# Patient Record
Sex: Female | Born: 1990 | Race: White | Hispanic: No | Smoking: Former smoker
Health system: Southern US, Community
[De-identification: ages and names within clinical notes are randomized; demographics above are authoritative.]

## PROBLEM LIST (undated history)

## (undated) ENCOUNTER — Inpatient Hospital Stay (HOSPITAL_COMMUNITY): Payer: Self-pay

## (undated) DIAGNOSIS — F419 Anxiety disorder, unspecified: Secondary | ICD-10-CM

## (undated) DIAGNOSIS — T7840XA Allergy, unspecified, initial encounter: Secondary | ICD-10-CM

## (undated) DIAGNOSIS — F17200 Nicotine dependence, unspecified, uncomplicated: Secondary | ICD-10-CM

## (undated) DIAGNOSIS — F341 Dysthymic disorder: Secondary | ICD-10-CM

## (undated) DIAGNOSIS — Z202 Contact with and (suspected) exposure to infections with a predominantly sexual mode of transmission: Secondary | ICD-10-CM

## (undated) DIAGNOSIS — G4730 Sleep apnea, unspecified: Secondary | ICD-10-CM

## (undated) DIAGNOSIS — A4902 Methicillin resistant Staphylococcus aureus infection, unspecified site: Secondary | ICD-10-CM

## (undated) DIAGNOSIS — N946 Dysmenorrhea, unspecified: Secondary | ICD-10-CM

## (undated) HISTORY — DX: Dysthymic disorder: F34.1

## (undated) HISTORY — DX: Dysmenorrhea, unspecified: N94.6

## (undated) HISTORY — DX: Allergy, unspecified, initial encounter: T78.40XA

## (undated) HISTORY — DX: Nicotine dependence, unspecified, uncomplicated: F17.200

## (undated) HISTORY — DX: Methicillin resistant Staphylococcus aureus infection, unspecified site: A49.02

## (undated) HISTORY — DX: Contact with and (suspected) exposure to infections with a predominantly sexual mode of transmission: Z20.2

---
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
## 2008-01-17 ENCOUNTER — Ambulatory Visit: Payer: Self-pay | Admitting: Internal Medicine

## 2008-01-22 ENCOUNTER — Ambulatory Visit: Payer: Self-pay | Admitting: Family Medicine

## 2008-01-22 ENCOUNTER — Encounter: Payer: Self-pay | Admitting: Family Medicine

## 2008-01-22 ENCOUNTER — Other Ambulatory Visit: Admission: RE | Admit: 2008-01-22 | Discharge: 2008-01-22 | Payer: Self-pay | Admitting: Family Medicine

## 2008-01-29 ENCOUNTER — Encounter (INDEPENDENT_AMBULATORY_CARE_PROVIDER_SITE_OTHER): Payer: Self-pay | Admitting: *Deleted

## 2008-01-29 LAB — CONVERTED CEMR LAB: Pap Smear: NORMAL

## 2008-04-10 ENCOUNTER — Ambulatory Visit: Payer: Self-pay | Admitting: Family Medicine

## 2008-04-17 ENCOUNTER — Ambulatory Visit: Payer: Self-pay | Admitting: Family Medicine

## 2008-05-06 ENCOUNTER — Telehealth: Payer: Self-pay | Admitting: Family Medicine

## 2008-05-17 ENCOUNTER — Ambulatory Visit: Payer: Self-pay | Admitting: Family Medicine

## 2008-05-17 LAB — CONVERTED CEMR LAB
Nitrite: NEGATIVE
Protein, U semiquant: NEGATIVE
Urine crystals, microscopic: 0 /hpf

## 2008-05-18 ENCOUNTER — Encounter: Payer: Self-pay | Admitting: Family Medicine

## 2008-07-11 ENCOUNTER — Ambulatory Visit: Payer: Self-pay | Admitting: Family Medicine

## 2008-07-11 LAB — CONVERTED CEMR LAB
Beta hcg, urine, semiquantitative: NEGATIVE
Bilirubin Urine: NEGATIVE
Blood in Urine, dipstick: NEGATIVE
Glucose, Urine, Semiquant: NEGATIVE
Ketones, urine, test strip: NEGATIVE
Protein, U semiquant: NEGATIVE
Specific Gravity, Urine: 1.015

## 2008-07-15 ENCOUNTER — Telehealth: Payer: Self-pay | Admitting: Family Medicine

## 2008-07-18 ENCOUNTER — Ambulatory Visit: Payer: Self-pay | Admitting: Family Medicine

## 2008-07-18 LAB — CONVERTED CEMR LAB
Bilirubin Urine: NEGATIVE
Ketones, urine, test strip: NEGATIVE
Nitrite: NEGATIVE
Specific Gravity, Urine: 1.015
Urine crystals, microscopic: 0 /hpf

## 2008-07-19 ENCOUNTER — Encounter: Payer: Self-pay | Admitting: Family Medicine

## 2008-07-23 ENCOUNTER — Ambulatory Visit: Payer: Self-pay | Admitting: Family Medicine

## 2008-07-24 ENCOUNTER — Telehealth (INDEPENDENT_AMBULATORY_CARE_PROVIDER_SITE_OTHER): Payer: Self-pay | Admitting: Internal Medicine

## 2008-09-02 ENCOUNTER — Ambulatory Visit: Payer: Self-pay | Admitting: Family Medicine

## 2008-09-05 LAB — CONVERTED CEMR LAB
ALT: 15 units/L (ref 0–35)
AST: 18 units/L (ref 0–37)
Alkaline Phosphatase: 49 units/L (ref 39–117)
Bilirubin, Direct: 0.1 mg/dL (ref 0.0–0.3)
Lipase: 19 units/L (ref 11.0–59.0)
MCV: 94.1 fL (ref 78.0–100.0)
RBC: 3.97 M/uL (ref 3.87–5.11)
Total Bilirubin: 0.4 mg/dL (ref 0.3–1.2)
WBC: 6.7 10*3/uL (ref 4.5–10.5)

## 2008-09-20 ENCOUNTER — Ambulatory Visit: Payer: Self-pay | Admitting: Family Medicine

## 2008-09-20 LAB — CONVERTED CEMR LAB
Casts: 0 /lpf
Nitrite: NEGATIVE
Protein, U semiquant: NEGATIVE
Urine crystals, microscopic: 0 /hpf
Urobilinogen, UA: 0.2

## 2008-09-21 ENCOUNTER — Encounter: Payer: Self-pay | Admitting: Family Medicine

## 2008-10-25 ENCOUNTER — Encounter (INDEPENDENT_AMBULATORY_CARE_PROVIDER_SITE_OTHER): Payer: Self-pay | Admitting: Internal Medicine

## 2008-10-25 ENCOUNTER — Ambulatory Visit: Payer: Self-pay | Admitting: Internal Medicine

## 2008-10-25 DIAGNOSIS — R634 Abnormal weight loss: Secondary | ICD-10-CM | POA: Insufficient documentation

## 2008-10-26 ENCOUNTER — Ambulatory Visit: Payer: Self-pay | Admitting: Family Medicine

## 2008-10-26 LAB — CONVERTED CEMR LAB
Blood in Urine, dipstick: NEGATIVE
Nitrite: NEGATIVE
Protein, U semiquant: 100
Urobilinogen, UA: 0.2
WBC Urine, dipstick: NEGATIVE

## 2008-10-30 LAB — CONVERTED CEMR LAB
BUN: 12 mg/dL (ref 6–23)
CO2: 23 meq/L (ref 19–32)
Calcium: 9.2 mg/dL (ref 8.4–10.5)
Chloride: 103 meq/L (ref 96–112)
Creatinine, Ser: 0.67 mg/dL (ref 0.40–1.20)
Eosinophils Relative: 0 % (ref 0–5)
Glucose, Bld: 73 mg/dL (ref 70–99)
HCT: 35.8 % — ABNORMAL LOW (ref 36.0–46.0)
Hemoglobin: 12.4 g/dL (ref 12.0–15.0)
Lymphocytes Relative: 13 % (ref 12–46)
Monocytes Absolute: 1 10*3/uL (ref 0.1–1.0)
Monocytes Relative: 11 % (ref 3–12)
Neutro Abs: 6.4 10*3/uL (ref 1.7–7.7)
RBC: 4.13 M/uL (ref 3.87–5.11)

## 2008-11-06 ENCOUNTER — Ambulatory Visit: Payer: Self-pay | Admitting: Family Medicine

## 2008-11-08 ENCOUNTER — Ambulatory Visit: Payer: Self-pay | Admitting: Internal Medicine

## 2008-11-08 LAB — CONVERTED CEMR LAB
Bacteria, UA: 0
Glucose, Urine, Semiquant: NEGATIVE
Nitrite: NEGATIVE
Specific Gravity, Urine: <1.005
Urobilinogen, UA: 0.2
pH: 7.5

## 2008-11-11 ENCOUNTER — Telehealth (INDEPENDENT_AMBULATORY_CARE_PROVIDER_SITE_OTHER): Payer: Self-pay | Admitting: *Deleted

## 2009-01-02 ENCOUNTER — Ambulatory Visit: Payer: Self-pay | Admitting: Family Medicine

## 2009-01-02 LAB — CONVERTED CEMR LAB
Ketones, urine, test strip: NEGATIVE
Urobilinogen, UA: 0.2
pH: 8

## 2009-01-03 ENCOUNTER — Encounter (INDEPENDENT_AMBULATORY_CARE_PROVIDER_SITE_OTHER): Payer: Self-pay | Admitting: Internal Medicine

## 2009-01-03 LAB — CONVERTED CEMR LAB

## 2009-01-28 ENCOUNTER — Ambulatory Visit: Payer: Self-pay | Admitting: Family Medicine

## 2009-01-28 LAB — CONVERTED CEMR LAB
Bilirubin Urine: NEGATIVE
Casts: 0 /lpf
Ketones, urine, test strip: NEGATIVE
Rapid Strep: POSITIVE
Urobilinogen, UA: 0.2

## 2009-01-29 ENCOUNTER — Encounter (INDEPENDENT_AMBULATORY_CARE_PROVIDER_SITE_OTHER): Payer: Self-pay | Admitting: Internal Medicine

## 2009-02-27 ENCOUNTER — Ambulatory Visit: Payer: Self-pay | Admitting: Family Medicine

## 2009-03-03 ENCOUNTER — Ambulatory Visit: Payer: Self-pay | Admitting: Family Medicine

## 2009-03-04 ENCOUNTER — Ambulatory Visit: Payer: Self-pay | Admitting: Family Medicine

## 2009-03-05 ENCOUNTER — Ambulatory Visit: Payer: Self-pay | Admitting: Family Medicine

## 2009-03-11 ENCOUNTER — Ambulatory Visit: Payer: Self-pay | Admitting: Family Medicine

## 2009-03-12 ENCOUNTER — Encounter (INDEPENDENT_AMBULATORY_CARE_PROVIDER_SITE_OTHER): Payer: Self-pay | Admitting: *Deleted

## 2009-03-21 ENCOUNTER — Ambulatory Visit: Payer: Self-pay | Admitting: Family Medicine

## 2009-03-21 ENCOUNTER — Encounter (INDEPENDENT_AMBULATORY_CARE_PROVIDER_SITE_OTHER): Payer: Self-pay | Admitting: Internal Medicine

## 2009-03-21 LAB — CONVERTED CEMR LAB
Bacteria, UA: 0
Beta hcg, urine, semiquantitative: NEGATIVE
Nitrite: NEGATIVE
Urobilinogen, UA: 0.2

## 2009-03-22 ENCOUNTER — Encounter: Payer: Self-pay | Admitting: Family Medicine

## 2009-03-27 ENCOUNTER — Telehealth (INDEPENDENT_AMBULATORY_CARE_PROVIDER_SITE_OTHER): Payer: Self-pay | Admitting: Internal Medicine

## 2009-03-27 LAB — CONVERTED CEMR LAB
Chlamydia, Swab/Urine, PCR: NEGATIVE
Herpes Simplex Vrs I&II-IgM Ab (EIA): 0.64

## 2009-05-13 ENCOUNTER — Ambulatory Visit: Payer: Self-pay | Admitting: Family Medicine

## 2009-05-15 LAB — CONVERTED CEMR LAB
Alkaline Phosphatase: 50 units/L (ref 39–117)
Basophils Relative: 0 % (ref 0.0–3.0)
Bilirubin, Direct: 0 mg/dL (ref 0.0–0.3)
CO2: 29 meq/L (ref 19–32)
Calcium: 9.5 mg/dL (ref 8.4–10.5)
Creatinine, Ser: 0.7 mg/dL (ref 0.4–1.2)
Eosinophils Absolute: 0.1 10*3/uL (ref 0.0–0.7)
Lymphocytes Relative: 25.5 % (ref 12.0–46.0)
MCHC: 33.6 g/dL (ref 30.0–36.0)
Neutrophils Relative %: 69.9 % (ref 43.0–77.0)
Platelets: 203 10*3/uL (ref 150.0–400.0)
RBC: 4.11 M/uL (ref 3.87–5.11)
Total Bilirubin: 0.6 mg/dL (ref 0.3–1.2)
Total Protein: 6.4 g/dL (ref 6.0–8.3)
WBC: 8.6 10*3/uL (ref 4.5–10.5)

## 2009-06-04 ENCOUNTER — Other Ambulatory Visit: Admission: RE | Admit: 2009-06-04 | Discharge: 2009-06-04 | Payer: Self-pay | Admitting: Family Medicine

## 2009-06-04 ENCOUNTER — Encounter: Payer: Self-pay | Admitting: Family Medicine

## 2009-06-04 ENCOUNTER — Ambulatory Visit: Payer: Self-pay | Admitting: Family Medicine

## 2009-06-04 LAB — HM PAP SMEAR

## 2009-08-06 ENCOUNTER — Telehealth (INDEPENDENT_AMBULATORY_CARE_PROVIDER_SITE_OTHER): Payer: Self-pay | Admitting: *Deleted

## 2009-08-19 ENCOUNTER — Telehealth: Payer: Self-pay | Admitting: Family Medicine

## 2009-08-19 ENCOUNTER — Ambulatory Visit: Payer: Self-pay | Admitting: Family Medicine

## 2009-09-10 ENCOUNTER — Encounter (INDEPENDENT_AMBULATORY_CARE_PROVIDER_SITE_OTHER): Payer: Self-pay | Admitting: *Deleted

## 2009-11-03 ENCOUNTER — Emergency Department (HOSPITAL_COMMUNITY): Admission: EM | Admit: 2009-11-03 | Discharge: 2009-11-03 | Payer: Self-pay | Admitting: Emergency Medicine

## 2010-01-27 ENCOUNTER — Inpatient Hospital Stay (HOSPITAL_COMMUNITY): Admission: AD | Admit: 2010-01-27 | Discharge: 2010-01-28 | Payer: Self-pay | Admitting: Obstetrics and Gynecology

## 2010-01-27 ENCOUNTER — Encounter: Payer: Self-pay | Admitting: Emergency Medicine

## 2010-03-02 ENCOUNTER — Inpatient Hospital Stay (HOSPITAL_COMMUNITY): Admission: AD | Admit: 2010-03-02 | Discharge: 2010-03-02 | Payer: Self-pay | Admitting: Obstetrics and Gynecology

## 2010-04-04 ENCOUNTER — Inpatient Hospital Stay (HOSPITAL_COMMUNITY): Admission: AD | Admit: 2010-04-04 | Discharge: 2010-04-07 | Payer: Self-pay | Admitting: Obstetrics and Gynecology

## 2010-04-04 ENCOUNTER — Inpatient Hospital Stay (HOSPITAL_COMMUNITY): Admission: AD | Admit: 2010-04-04 | Discharge: 2010-04-04 | Payer: Self-pay | Admitting: Obstetrics and Gynecology

## 2010-08-17 ENCOUNTER — Emergency Department (HOSPITAL_COMMUNITY)
Admission: EM | Admit: 2010-08-17 | Discharge: 2010-08-17 | Payer: Self-pay | Source: Home / Self Care | Admitting: Emergency Medicine

## 2010-11-28 LAB — MRSA CULTURE

## 2010-11-28 LAB — CBC
Hemoglobin: 11.8 g/dL — ABNORMAL LOW (ref 12.0–15.0)
MCH: 32.8 pg (ref 26.0–34.0)
MCHC: 34.3 g/dL (ref 30.0–36.0)
MCHC: 34.7 g/dL (ref 30.0–36.0)
MCV: 95.6 fL (ref 78.0–100.0)
Platelets: 141 10*3/uL — ABNORMAL LOW (ref 150–400)
RBC: 3.3 MIL/uL — ABNORMAL LOW (ref 3.87–5.11)
RBC: 3.61 MIL/uL — ABNORMAL LOW (ref 3.87–5.11)
WBC: 9.1 10*3/uL (ref 4.0–10.5)

## 2010-11-28 LAB — RPR: RPR Ser Ql: NONREACTIVE

## 2010-11-28 LAB — MRSA PCR SCREENING

## 2010-11-29 LAB — COMPREHENSIVE METABOLIC PANEL
Alkaline Phosphatase: 131 U/L — ABNORMAL HIGH (ref 39–117)
BUN: 3 mg/dL — ABNORMAL LOW (ref 6–23)
Chloride: 105 mEq/L (ref 96–112)
Glucose, Bld: 77 mg/dL (ref 70–99)
Potassium: 3.5 mEq/L (ref 3.5–5.1)
Total Bilirubin: 0.3 mg/dL (ref 0.3–1.2)

## 2010-11-29 LAB — URINALYSIS, ROUTINE W REFLEX MICROSCOPIC
Bilirubin Urine: NEGATIVE
Hgb urine dipstick: NEGATIVE
Ketones, ur: 40 mg/dL — AB
Nitrite: NEGATIVE
Protein, ur: NEGATIVE mg/dL
Urobilinogen, UA: 0.2 mg/dL (ref 0.0–1.0)
pH: 6.5 (ref 5.0–8.0)

## 2010-11-29 LAB — URINE CULTURE

## 2010-11-29 LAB — CBC
HCT: 35.2 % — ABNORMAL LOW (ref 36.0–46.0)
Hemoglobin: 12.1 g/dL (ref 12.0–15.0)
RDW: 13.1 % (ref 11.5–15.5)
WBC: 7.9 10*3/uL (ref 4.0–10.5)

## 2010-11-29 LAB — URINE MICROSCOPIC-ADD ON

## 2010-11-29 LAB — STREP B DNA PROBE: Strep Group B Ag: NEGATIVE

## 2010-11-29 LAB — GC/CHLAMYDIA PROBE AMP, GENITAL
Chlamydia, DNA Probe: NEGATIVE
GC Probe Amp, Genital: NEGATIVE

## 2010-11-30 LAB — DIFFERENTIAL
Basophils Absolute: 0 10*3/uL (ref 0.0–0.1)
Eosinophils Relative: 1 % (ref 0–5)
Lymphocytes Relative: 14 % (ref 12–46)
Monocytes Absolute: 0.6 10*3/uL (ref 0.1–1.0)
Monocytes Relative: 6 % (ref 3–12)

## 2010-11-30 LAB — CBC
HCT: 36.1 % (ref 36.0–46.0)
Hemoglobin: 12.5 g/dL (ref 12.0–15.0)
RBC: 3.77 MIL/uL — ABNORMAL LOW (ref 3.87–5.11)
RDW: 13.1 % (ref 11.5–15.5)

## 2010-11-30 LAB — POCT I-STAT, CHEM 8
BUN: 6 mg/dL (ref 6–23)
Calcium, Ion: 1.16 mmol/L (ref 1.12–1.32)
Creatinine, Ser: 0.7 mg/dL (ref 0.4–1.2)
Glucose, Bld: 82 mg/dL (ref 70–99)
TCO2: 20 mmol/L (ref 0–100)

## 2010-11-30 LAB — MRSA PCR SCREENING: MRSA by PCR: NEGATIVE

## 2010-12-04 LAB — URINALYSIS, ROUTINE W REFLEX MICROSCOPIC
Bilirubin Urine: NEGATIVE
Glucose, UA: NEGATIVE mg/dL
Ketones, ur: NEGATIVE mg/dL
Nitrite: NEGATIVE
Protein, ur: 30 mg/dL — AB
pH: 8 (ref 5.0–8.0)

## 2010-12-04 LAB — CBC
HCT: 35.2 % — ABNORMAL LOW (ref 36.0–46.0)
MCHC: 35.4 g/dL (ref 30.0–36.0)
MCV: 93.3 fL (ref 78.0–100.0)
Platelets: 190 10*3/uL (ref 150–400)
RDW: 12.7 % (ref 11.5–15.5)

## 2010-12-04 LAB — BASIC METABOLIC PANEL
BUN: 8 mg/dL (ref 6–23)
CO2: 26 mEq/L (ref 19–32)
Chloride: 106 mEq/L (ref 96–112)
Creatinine, Ser: 0.62 mg/dL (ref 0.4–1.2)
Glucose, Bld: 81 mg/dL (ref 70–99)
Potassium: 3.6 mEq/L (ref 3.5–5.1)

## 2010-12-04 LAB — URINE MICROSCOPIC-ADD ON

## 2010-12-04 LAB — URINE CULTURE: Colony Count: 7000

## 2010-12-04 LAB — DIFFERENTIAL
Basophils Relative: 0 % (ref 0–1)
Eosinophils Absolute: 0.1 10*3/uL (ref 0.0–0.7)
Eosinophils Relative: 1 % (ref 0–5)
Neutrophils Relative %: 81 % — ABNORMAL HIGH (ref 43–77)

## 2010-12-04 LAB — HCG, QUANTITATIVE, PREGNANCY: hCG, Beta Chain, Quant, S: 36910 m[IU]/mL — ABNORMAL HIGH (ref <5)

## 2011-02-10 ENCOUNTER — Emergency Department (HOSPITAL_COMMUNITY)
Admission: EM | Admit: 2011-02-10 | Discharge: 2011-02-10 | Disposition: A | Discharge status: Home / Self Care | Payer: Self-pay | Attending: Emergency Medicine | Admitting: Emergency Medicine

## 2011-02-10 DIAGNOSIS — L299 Pruritus, unspecified: Secondary | ICD-10-CM | POA: Insufficient documentation

## 2011-02-10 DIAGNOSIS — B029 Zoster without complications: Secondary | ICD-10-CM | POA: Insufficient documentation

## 2011-02-10 DIAGNOSIS — R21 Rash and other nonspecific skin eruption: Secondary | ICD-10-CM | POA: Insufficient documentation

## 2011-04-21 ENCOUNTER — Emergency Department (HOSPITAL_COMMUNITY)
Admission: EM | Admit: 2011-04-21 | Discharge: 2011-04-21 | Disposition: A | Discharge status: Home / Self Care | Payer: Self-pay | Attending: Emergency Medicine | Admitting: Emergency Medicine

## 2011-04-21 DIAGNOSIS — M545 Low back pain, unspecified: Secondary | ICD-10-CM | POA: Insufficient documentation

## 2011-04-21 DIAGNOSIS — N390 Urinary tract infection, site not specified: Secondary | ICD-10-CM | POA: Insufficient documentation

## 2011-04-21 DIAGNOSIS — M79609 Pain in unspecified limb: Secondary | ICD-10-CM | POA: Insufficient documentation

## 2011-04-21 LAB — URINALYSIS, ROUTINE W REFLEX MICROSCOPIC
Glucose, UA: NEGATIVE mg/dL
Ketones, ur: 15 mg/dL — AB
Protein, ur: NEGATIVE mg/dL

## 2011-04-21 LAB — URINE MICROSCOPIC-ADD ON

## 2011-04-21 LAB — POCT PREGNANCY, URINE: Preg Test, Ur: NEGATIVE

## 2011-05-05 ENCOUNTER — Emergency Department (HOSPITAL_COMMUNITY)
Admission: EM | Admit: 2011-05-05 | Discharge: 2011-05-05 | Disposition: A | Discharge status: Home / Self Care | Payer: Self-pay | Attending: Emergency Medicine | Admitting: Emergency Medicine

## 2011-05-05 DIAGNOSIS — F17200 Nicotine dependence, unspecified, uncomplicated: Secondary | ICD-10-CM | POA: Insufficient documentation

## 2011-05-05 DIAGNOSIS — J40 Bronchitis, not specified as acute or chronic: Secondary | ICD-10-CM | POA: Insufficient documentation

## 2011-05-05 DIAGNOSIS — R059 Cough, unspecified: Secondary | ICD-10-CM | POA: Insufficient documentation

## 2011-05-05 DIAGNOSIS — R05 Cough: Secondary | ICD-10-CM | POA: Insufficient documentation

## 2011-06-07 ENCOUNTER — Emergency Department (HOSPITAL_COMMUNITY)
Admission: EM | Admit: 2011-06-07 | Discharge: 2011-06-07 | Disposition: A | Discharge status: Home / Self Care | Payer: Self-pay | Attending: Emergency Medicine | Admitting: Emergency Medicine

## 2011-06-07 DIAGNOSIS — N739 Female pelvic inflammatory disease, unspecified: Secondary | ICD-10-CM | POA: Insufficient documentation

## 2011-06-07 DIAGNOSIS — R300 Dysuria: Secondary | ICD-10-CM | POA: Insufficient documentation

## 2011-06-07 DIAGNOSIS — R350 Frequency of micturition: Secondary | ICD-10-CM | POA: Insufficient documentation

## 2011-06-07 DIAGNOSIS — R109 Unspecified abdominal pain: Secondary | ICD-10-CM | POA: Insufficient documentation

## 2011-06-07 DIAGNOSIS — M545 Low back pain, unspecified: Secondary | ICD-10-CM | POA: Insufficient documentation

## 2011-06-07 DIAGNOSIS — N390 Urinary tract infection, site not specified: Secondary | ICD-10-CM | POA: Insufficient documentation

## 2011-06-07 DIAGNOSIS — S335XXA Sprain of ligaments of lumbar spine, initial encounter: Secondary | ICD-10-CM | POA: Insufficient documentation

## 2011-06-07 DIAGNOSIS — X58XXXA Exposure to other specified factors, initial encounter: Secondary | ICD-10-CM | POA: Insufficient documentation

## 2011-06-07 DIAGNOSIS — M546 Pain in thoracic spine: Secondary | ICD-10-CM | POA: Insufficient documentation

## 2011-06-07 LAB — URINALYSIS, ROUTINE W REFLEX MICROSCOPIC
Hgb urine dipstick: NEGATIVE
Nitrite: NEGATIVE
Specific Gravity, Urine: 1.019 (ref 1.005–1.030)
Urobilinogen, UA: 0.2 mg/dL (ref 0.0–1.0)

## 2011-06-07 LAB — URINE MICROSCOPIC-ADD ON

## 2011-06-07 LAB — POCT PREGNANCY, URINE: Preg Test, Ur: NEGATIVE

## 2011-06-08 LAB — GC/CHLAMYDIA PROBE AMP, GENITAL
Chlamydia, DNA Probe: NEGATIVE
GC Probe Amp, Genital: NEGATIVE

## 2011-07-21 ENCOUNTER — Ambulatory Visit (INDEPENDENT_AMBULATORY_CARE_PROVIDER_SITE_OTHER): Payer: 59 | Admitting: Family Medicine

## 2011-07-21 ENCOUNTER — Encounter: Payer: Self-pay | Admitting: Family Medicine

## 2011-07-21 VITALS — BP 92/64 | HR 74 | Temp 98.0°F | Ht 66.0 in | Wt 112.0 lb

## 2011-07-21 DIAGNOSIS — Z331 Pregnant state, incidental: Secondary | ICD-10-CM

## 2011-07-21 DIAGNOSIS — N926 Irregular menstruation, unspecified: Secondary | ICD-10-CM

## 2011-07-21 DIAGNOSIS — Z3201 Encounter for pregnancy test, result positive: Secondary | ICD-10-CM

## 2011-07-21 MED ORDER — PRENATAL RX 60-1 MG PO TABS: 1.0000 | ORAL_TABLET | Freq: Every day | ORAL | Status: DC

## 2011-07-21 NOTE — Progress Notes (Signed)
  Subjective:    Patient ID: Taylor Bautista, female    DOB: Sep 05, 1991, 20 y.o.   MRN: [id number]  HPI  20 yo pt of Dr. Milinda Antis here for ?pregnancy.  G1P1.  LMP 06/14/2011. Trying to get pregnant.  Has a 48 year old son.  Not smoking. Denies any breast tenderness, nausea, vomiting or fatigue.  Patient Active Problem List  Diagnoses  . DEPRESSION/ANXIETY  . TOBACCO USE  . DEPRESSION  . ALLERGIC RHINITIS  . WEIGHT LOSS  . Pregnancy examination or test, positive result   Past Medical History  Diagnosis Date  . Allergy   . Depression   . Dysmenorrhea   . Exposure to STD   . MRSA infection    No past surgical history on file. History  Substance Use Topics  . Smoking status: Current Everyday Smoker  . Smokeless tobacco: Not on file  . 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 Cancer Mother     breast  . Cancer Cousin     breast  . Anxiety disorder Other    No Known Allergies No current outpatient prescriptions on file prior to visit.   The PMH, PSH, Social History, Family History, Medications, and allergies have been reviewed in [facility], and have been updated if relevant.   Review of Systems See HPI    Objective:   Physical Exam  BP 92/64  Pulse 74  Temp(Src) 98 F (36.7 C) (Oral)  Ht 5\' 6"  (1.676 m)  Wt 112 lb (50.803 kg)  BMI 18.08 kg/m2  LMP 06/20/2011  General:  Well-developed,well-nourished,in no acute distress; alert,appropriate and cooperative throughout examination Head:  normocephalic and atraumatic.   Eyes:  vision grossly intact, pupils equal, pupils round, and pupils reactive to light.   Ears:  R ear normal and L ear normal.   Nose:  no external deformity.   Mouth:  good dentition.   Psych:  Cognition and judgment appear intact. Alert and cooperative with normal attention span and concentration. No apparent delusions, illusions, hallucinations    Assessment &  Plan:   1. Pregnancy examination or test, positive result  Ambulatory referral to Obstetrics / Gynecology   New. Approximately 5 weeks 2 days based on LMP. Referred to OBGYN. Given rx for PNV.

## 2011-07-21 NOTE — Patient Instructions (Signed)
Congratulations! Please stop by to see Taylor Bautista on your way out. Take a Prenatal vitamin daily.

## 2011-08-10 ENCOUNTER — Telehealth: Payer: Self-pay | Admitting: *Deleted

## 2011-08-10 NOTE — Telephone Encounter (Signed)
Letter done and in IN box 

## 2011-08-10 NOTE — Telephone Encounter (Signed)
Pt is asking for a letter stating that she had a postive pregnancy test in the office.  She needs this to apply for medicaid.

## 2011-08-11 ENCOUNTER — Encounter: Payer: Self-pay | Admitting: Family Medicine

## 2011-08-12 ENCOUNTER — Ambulatory Visit: Payer: 59 | Admitting: Family Medicine

## 2011-08-31 NOTE — Telephone Encounter (Signed)
Left vm for pt to callback 

## 2011-09-08 NOTE — Telephone Encounter (Signed)
Patient notified as instructed by telephone. Pt said she does not need the letter because she lost the baby. Condolence given.

## 2012-04-09 ENCOUNTER — Emergency Department (HOSPITAL_COMMUNITY)
Admission: EM | Admit: 2012-04-09 | Discharge: 2012-04-09 | Disposition: A | Discharge status: Home / Self Care | Payer: Self-pay | Attending: Emergency Medicine | Admitting: Emergency Medicine

## 2012-04-09 ENCOUNTER — Emergency Department (HOSPITAL_COMMUNITY): Payer: Self-pay

## 2012-04-09 ENCOUNTER — Encounter (HOSPITAL_COMMUNITY): Payer: Self-pay | Admitting: *Deleted

## 2012-04-09 DIAGNOSIS — Z87891 Personal history of nicotine dependence: Secondary | ICD-10-CM | POA: Insufficient documentation

## 2012-04-09 DIAGNOSIS — R071 Chest pain on breathing: Secondary | ICD-10-CM | POA: Insufficient documentation

## 2012-04-09 DIAGNOSIS — Z8614 Personal history of Methicillin resistant Staphylococcus aureus infection: Secondary | ICD-10-CM | POA: Insufficient documentation

## 2012-04-09 DIAGNOSIS — R0789 Other chest pain: Secondary | ICD-10-CM

## 2012-04-09 LAB — URINALYSIS, ROUTINE W REFLEX MICROSCOPIC
Glucose, UA: NEGATIVE mg/dL
Hgb urine dipstick: NEGATIVE
Specific Gravity, Urine: 1.022 (ref 1.005–1.030)

## 2012-04-09 LAB — POCT PREGNANCY, URINE: Preg Test, Ur: NEGATIVE

## 2012-04-09 LAB — WET PREP, GENITAL: Yeast Wet Prep HPF POC: NONE SEEN

## 2012-04-09 NOTE — ED Notes (Signed)
Patient reports she has upper abd/epigastric pain since Thursday.  She states the area is tender to touch and it hurts when she breathes, coughs, moves.  Patient denies fever. Patient denies n/v

## 2012-04-09 NOTE — ED Notes (Signed)
She also reports vaginal discharge with some odor for 1 week with lower abd pain

## 2012-04-09 NOTE — ED Provider Notes (Signed)
Patient signed out to me pending u/a and wet prep Results negative She clearly has left chest wall pain with palpation Otherwise well appearing, no distress, she is watching TV Stable for d/c  Joya Gaskins, MD 04/09/12 1705

## 2012-04-09 NOTE — ED Notes (Signed)
Called pts name in lobby, no answer x 1

## 2012-04-09 NOTE — ED Provider Notes (Signed)
History     CSN: [id number]  Arrival date & time 04/09/12  1247   First MD Initiated Contact with Patient 04/09/12 1453      Chief Complaint  Patient presents with  . Abdominal Pain  . Chest Pain    (Consider location/radiation/quality/duration/timing/severity/associated sxs/prior treatment) HPI  21 year old female with history of dysmenorrhea, and exposure to STD presents complaining of vaginal discharge and odor for the past one week. Sts she think it may be related to a yeast infection as it felt similar.  Last infection 2 years ago.  Notice mild burning on urination without frequency or urgency.  LMP last week.    Also c/o chest discomfort x 5 days.  Sts she works at [employer] and does do heavy lifting.  She notice pain to her L lower chest.  Pain is sharp, throbbing, worsening with palpation or taking deep breath. Pain improves with ibuprofen.  Denies fever, chills, sore throat, cough, hemoptysis, or dypsnea on exertion.  Quit smoking 2 weeks ago. Denies taking birth control pill, having recent surgery, or travel on long trip.    Past Medical History  Diagnosis Date  . Allergy   . Dysthymic disorder   . Dysmenorrhea   . Exposure to STD   . MRSA infection   . Tobacco use disorder     Past Surgical History  Procedure Date  . Pigmented nevus removal 04/1989    Family History  Problem Relation Age of Onset  . Cancer Mother     breast  . Cancer Cousin 24    breast  . Anxiety disorder Other     History  Substance Use Topics  . Smoking status: Former Games developer  . Smokeless tobacco: Not on file  . Alcohol Use: Yes    OB History    Grav Para Term Preterm Abortions TAB SAB Ect Mult Living                  Review of Systems  All other systems reviewed and are negative.    Allergies  Review of patient's allergies indicates no known allergies.  Home Medications   Current Outpatient Rx  Name Route Sig Dispense Refill  . IBUPROFEN 200 MG PO TABS Oral Take  400 mg by mouth every 8 (eight) hours as needed. For pain    . OVER THE COUNTER MEDICATION Vaginal Place 1 application vaginally daily.      BP 94/51  Pulse 66  Temp 98.2 F (36.8 C) (Oral)  Resp 16  Ht 5\' 6"  (1.676 m)  Wt 120 lb (54.432 kg)  BMI 19.37 kg/m2  SpO2 99%  LMP 03/29/2012  Physical Exam  Nursing note and vitals reviewed. Constitutional: She is oriented to person, place, and time. She appears well-developed and well-nourished. No distress.  HENT:  Head: Normocephalic and atraumatic.  Mouth/Throat: Oropharynx is clear and moist.  Eyes: Conjunctivae are normal.  Neck: Normal range of motion. Neck supple.  Cardiovascular: Normal rate and regular rhythm.   Pulmonary/Chest: Effort normal and breath sounds normal. She exhibits no tenderness.    Abdominal: Soft. There is no tenderness. Hernia confirmed negative in the right inguinal area and confirmed negative in the left inguinal area.  Genitourinary: Uterus normal. There is no rash, tenderness or lesion on the right labia. There is no rash, tenderness or lesion on the left labia. Cervix exhibits no motion tenderness and no discharge. Right adnexum displays no mass and no tenderness. Left adnexum displays no mass and no  tenderness. No erythema, tenderness or bleeding around the vagina. Vaginal discharge found.       Chaperone present  Lymphadenopathy:       Right: No inguinal adenopathy present.       Left: No inguinal adenopathy present.  Neurological: She is alert and oriented to person, place, and time.    ED Course  Procedures (including critical care time)   Labs Reviewed  POCT PREGNANCY, URINE   Dg Chest 2 View  04/09/2012  *RADIOLOGY REPORT*  Clinical Data: Left-sided chest pain  CHEST - 2 VIEW  Comparison: 01/27/2010  Findings: Cardiomediastinal silhouette is stable.  No acute infiltrate or pleural effusion.  No pulmonary edema.  Bony thorax is stable.  IMPRESSION: No active disease.  Original Report  Authenticated By: Natasha Mead, M.D.   Results for orders placed during the hospital encounter of 04/09/12  POCT PREGNANCY, URINE      Component Value Range   Preg Test, Ur NEGATIVE  NEGATIVE  WET PREP, GENITAL      Component Value Range   Yeast Wet Prep HPF POC NONE SEEN  NONE SEEN   Trich, Wet Prep NONE SEEN  NONE SEEN   Clue Cells Wet Prep HPF POC FEW (*) NONE SEEN   WBC, Wet Prep HPF POC RARE (*) NONE SEEN  URINALYSIS, ROUTINE W REFLEX MICROSCOPIC      Component Value Range   Color, Urine YELLOW  YELLOW   APPearance CLEAR  CLEAR   Specific Gravity, Urine 1.022  1.005 - 1.030   pH 5.5  5.0 - 8.0   Glucose, UA NEGATIVE  NEGATIVE mg/dL   Hgb urine dipstick NEGATIVE  NEGATIVE   Bilirubin Urine NEGATIVE  NEGATIVE   Ketones, ur NEGATIVE  NEGATIVE mg/dL   Protein, ur NEGATIVE  NEGATIVE mg/dL   Urobilinogen, UA 0.2  0.0 - 1.0 mg/dL   Nitrite NEGATIVE  NEGATIVE   Leukocytes, UA NEGATIVE  NEGATIVE   Dg Chest 2 View  04/09/2012  *RADIOLOGY REPORT*  Clinical Data: Left-sided chest pain  CHEST - 2 VIEW  Comparison: 01/27/2010  Findings: Cardiomediastinal silhouette is stable.  No acute infiltrate or pleural effusion.  No pulmonary edema.  Bony thorax is stable.  IMPRESSION: No active disease.  Original Report Authenticated By: Natasha Mead, M.D.    1. Chest wall pain  MDM  Chest wall pain, reproducible on exam.  Pt is PERC negative.  CXR unremarkable.    Pelvic examination with moderate discharge without tenderness, no CMT.    Signed out to my attending, who will continue management.        Fayrene Helper, PA-C 04/10/12 1046

## 2012-04-10 NOTE — ED Provider Notes (Signed)
Medical screening examination/treatment/procedure(s) were performed by non-physician practitioner and as supervising physician I was immediately available for consultation/collaboration.   Nat Christen, MD 04/10/12 1515

## 2012-07-17 ENCOUNTER — Encounter (HOSPITAL_COMMUNITY): Payer: Self-pay | Admitting: *Deleted

## 2012-07-17 ENCOUNTER — Emergency Department (HOSPITAL_COMMUNITY)
Admission: EM | Admit: 2012-07-17 | Discharge: 2012-07-17 | Disposition: A | Discharge status: Home / Self Care | Payer: Self-pay | Attending: Emergency Medicine | Admitting: Emergency Medicine

## 2012-07-17 DIAGNOSIS — Z8742 Personal history of other diseases of the female genital tract: Secondary | ICD-10-CM | POA: Insufficient documentation

## 2012-07-17 DIAGNOSIS — F341 Dysthymic disorder: Secondary | ICD-10-CM | POA: Insufficient documentation

## 2012-07-17 DIAGNOSIS — L309 Dermatitis, unspecified: Secondary | ICD-10-CM

## 2012-07-17 DIAGNOSIS — Z87891 Personal history of nicotine dependence: Secondary | ICD-10-CM | POA: Insufficient documentation

## 2012-07-17 DIAGNOSIS — L259 Unspecified contact dermatitis, unspecified cause: Secondary | ICD-10-CM | POA: Insufficient documentation

## 2012-07-17 DIAGNOSIS — Z202 Contact with and (suspected) exposure to infections with a predominantly sexual mode of transmission: Secondary | ICD-10-CM | POA: Insufficient documentation

## 2012-07-17 DIAGNOSIS — Z9109 Other allergy status, other than to drugs and biological substances: Secondary | ICD-10-CM | POA: Insufficient documentation

## 2012-07-17 DIAGNOSIS — Z8614 Personal history of Methicillin resistant Staphylococcus aureus infection: Secondary | ICD-10-CM | POA: Insufficient documentation

## 2012-07-17 MED ORDER — HYDROCORTISONE 1 % EX CREA: TOPICAL_CREAM | CUTANEOUS | Status: DC

## 2012-07-17 NOTE — ED Notes (Addendum)
Pt c/o urticaria d/t rash to left wrist started Friday. Pt denies pain, loss of motion, or weakness to left wrist. Pt A&Ox4, ambulatory. Pt has hx of MRSA 3yrs ago to right shoulder.

## 2012-07-17 NOTE — ED Notes (Signed)
Left wrist rash since friday

## 2012-07-17 NOTE — ED Provider Notes (Signed)
History  This chart was scribed for Suzi Roots, MD by Shari Heritage. The patient was seen in room TR10C/TR10C. Patient's care was started at 1300.     CSN: [id number]  Arrival date & time 07/17/12  1126   First MD Initiated Contact with Patient 07/17/12 1300      Chief Complaint  Patient presents with  . Rash     The history is provided by the patient. No language interpreter was used.   HPI Comments: Taylor Bautista is a 21 y.o. female who presents to the Emergency Department complaining of a small, itchy rash to left wrist onset 3 days ago. No associated pain. No exposure to new soaps, detergents or chemicals. No exposure to aggravating plants. No rash to other areas. No fever or chills. No nausea or vomiting. Does not feel ill. No numbness or weakness in left upper extremity. Patient with hx of MRSA to right shoulder.  Current lesion is small, superficial, not painful. Did not see any insect bite or sting. Otherwise feels well.. No fevers. No other areas of rash.   Past Medical History  Diagnosis Date  . Allergy   . Dysthymic disorder   . Dysmenorrhea   . Exposure to STD   . MRSA infection   . Tobacco use disorder     Past Surgical History  Procedure Date  . Pigmented nevus removal 04/1989    Family History  Problem Relation Age of Onset  . Cancer Mother     breast  . Cancer Cousin 24    breast  . Anxiety disorder Other     History  Substance Use Topics  . Smoking status: Former Games developer  . Smokeless tobacco: Not on file  . Alcohol Use: Yes    OB History    Grav Para Term Preterm Abortions TAB SAB Ect Mult Living                  Review of Systems  Constitutional: Negative for fever and chills.  Gastrointestinal: Negative for nausea and vomiting.  Skin: Positive for rash.  Neurological: Negative for weakness and numbness.    Allergies  Review of patient's allergies indicates no known allergies.  Home Medications   Current Outpatient Rx  Name   Route  Sig  Dispense  Refill  . IBUPROFEN 200 MG PO TABS   Oral   Take 400 mg by mouth every 8 (eight) hours as needed. For pain         . OVER THE COUNTER MEDICATION   Vaginal   Place 1 application vaginally daily.           BP 117/64  Pulse 71  Temp 98.3 F (36.8 C) (Oral)  Resp 18  SpO2 100%  LMP 07/10/2012  Physical Exam  Nursing note and vitals reviewed. Constitutional: She is oriented to person, place, and time. She appears well-developed and well-nourished. No distress.  HENT:  Head: Normocephalic and atraumatic.  Eyes: EOM are normal.  Neck: Normal range of motion. Neck supple.  Cardiovascular: Normal rate.   Pulmonary/Chest: Effort normal.  Musculoskeletal: Normal range of motion. She exhibits no edema and no tenderness.  Neurological: She is alert and oriented to person, place, and time.  Skin: Skin is warm and dry.       Single tiny erythematous, sl raised, scaly lesion to volar aspect left wrist, approximately 2 mm by 5 mm. No surrounding erythema or cellulitis. No induration. No abscess.   Psychiatric: She has  a normal mood and affect. Her behavior is normal.    ED Course  Procedures (including critical care time) DIAGNOSTIC STUDIES: Oxygen Saturation is 100% on room air, normal by my interpretation.    COORDINATION OF CARE: 1:08pm- Patient informed of current plan for treatment and evaluation and agrees with plan at this time.      MDM  I personally performed the services described in this documentation, which was scribed in my presence. The recorded information has been reviewed and considered. Suzi Roots, MD    Suzi Roots, MD 07/17/12 [phone]

## 2012-09-13 NOTE — L&D Delivery Note (Signed)
Attestation of Attending Supervision of Obstetric Fellow: Evaluation and management procedures were performed by the Obstetric Fellow under my supervision and collaboration.  I have reviewed the Obstetric Fellow's note and chart, and I agree with the management and plan.  UGONNA  ANYANWU, MD, FACOG Attending Obstetrician & Gynecologist Faculty Practice, [facility]   

## 2012-09-13 NOTE — L&D Delivery Note (Signed)
Delivery Note At 9:03 AM a viable female was delivered via Vaginal, Spontaneous Delivery (Presentation: Left Occiput Anterior).  APGAR: Pending Nursing, Crying at Perineum, ; weight .   Placenta status: Intact, Spontaneous.  Cord: 3 vessels with the following complications: None.  Cord pH: Not sent  Anesthesia: Epidural  Episiotomy: None Lacerations: 2nd degree Suture Repair: 3.0 vicryl Est. Blood Loss (mL): 300cc  Repaired in the usual fashion. Hemostatic at completion.    Mom to postpartum.  Baby to nursery-stable.  Tawana Scale 04/13/2013, 9:31 AM

## 2012-09-22 ENCOUNTER — Emergency Department (HOSPITAL_COMMUNITY)
Admission: EM | Admit: 2012-09-22 | Discharge: 2012-09-22 | Disposition: A | Discharge status: Home / Self Care | Payer: Medicaid Other | Attending: Emergency Medicine | Admitting: Emergency Medicine

## 2012-09-22 ENCOUNTER — Other Ambulatory Visit: Payer: Self-pay

## 2012-09-22 ENCOUNTER — Encounter (HOSPITAL_COMMUNITY): Payer: Self-pay | Admitting: Emergency Medicine

## 2012-09-22 ENCOUNTER — Emergency Department (HOSPITAL_COMMUNITY): Payer: Medicaid Other

## 2012-09-22 DIAGNOSIS — B379 Candidiasis, unspecified: Secondary | ICD-10-CM | POA: Insufficient documentation

## 2012-09-22 DIAGNOSIS — Z8679 Personal history of other diseases of the circulatory system: Secondary | ICD-10-CM | POA: Insufficient documentation

## 2012-09-22 DIAGNOSIS — N390 Urinary tract infection, site not specified: Secondary | ICD-10-CM | POA: Insufficient documentation

## 2012-09-22 DIAGNOSIS — Z8614 Personal history of Methicillin resistant Staphylococcus aureus infection: Secondary | ICD-10-CM | POA: Insufficient documentation

## 2012-09-22 DIAGNOSIS — Z3490 Encounter for supervision of normal pregnancy, unspecified, unspecified trimester: Secondary | ICD-10-CM

## 2012-09-22 DIAGNOSIS — Z202 Contact with and (suspected) exposure to infections with a predominantly sexual mode of transmission: Secondary | ICD-10-CM | POA: Insufficient documentation

## 2012-09-22 DIAGNOSIS — Z87891 Personal history of nicotine dependence: Secondary | ICD-10-CM | POA: Insufficient documentation

## 2012-09-22 DIAGNOSIS — O2390 Unspecified genitourinary tract infection in pregnancy, unspecified trimester: Secondary | ICD-10-CM | POA: Insufficient documentation

## 2012-09-22 DIAGNOSIS — Z8742 Personal history of other diseases of the female genital tract: Secondary | ICD-10-CM | POA: Insufficient documentation

## 2012-09-22 DIAGNOSIS — O9989 Other specified diseases and conditions complicating pregnancy, childbirth and the puerperium: Secondary | ICD-10-CM | POA: Insufficient documentation

## 2012-09-22 DIAGNOSIS — Z3201 Encounter for pregnancy test, result positive: Secondary | ICD-10-CM | POA: Insufficient documentation

## 2012-09-22 LAB — BASIC METABOLIC PANEL
BUN: 9 mg/dL (ref 6–23)
Chloride: 100 mEq/L (ref 96–112)
Creatinine, Ser: 0.58 mg/dL (ref 0.50–1.10)
Glucose, Bld: 79 mg/dL (ref 70–99)
Potassium: 3.6 mEq/L (ref 3.5–5.1)

## 2012-09-22 LAB — URINALYSIS, ROUTINE W REFLEX MICROSCOPIC
Glucose, UA: NEGATIVE mg/dL
Protein, ur: NEGATIVE mg/dL
pH: 5.5 (ref 5.0–8.0)

## 2012-09-22 LAB — CBC WITH DIFFERENTIAL/PLATELET
HCT: 34.2 % — ABNORMAL LOW (ref 36.0–46.0)
Lymphocytes Relative: 20 % (ref 12–46)
Monocytes Absolute: 0.5 10*3/uL (ref 0.1–1.0)
Neutro Abs: 3.8 10*3/uL (ref 1.7–7.7)
Neutrophils Relative %: 70 % (ref 43–77)
RDW: 12.5 % (ref 11.5–15.5)
WBC: 5.4 10*3/uL (ref 4.0–10.5)

## 2012-09-22 LAB — WET PREP, GENITAL: Clue Cells Wet Prep HPF POC: NONE SEEN

## 2012-09-22 LAB — POCT PREGNANCY, URINE: Preg Test, Ur: POSITIVE — AB

## 2012-09-22 LAB — URINE MICROSCOPIC-ADD ON

## 2012-09-22 MED ORDER — NITROFURANTOIN MONOHYD MACRO 100 MG PO CAPS: 100.0000 mg | ORAL_CAPSULE | Freq: Two times a day (BID) | ORAL | Status: DC

## 2012-09-22 MED ORDER — CLOTRIMAZOLE 1 % VA CREA: 1.0000 | TOPICAL_CREAM | Freq: Every day | VAGINAL | Status: AC

## 2012-09-22 MED ORDER — SODIUM CHLORIDE 0.9 % IV BOLUS (SEPSIS)
1000.0000 mL | Freq: Once | INTRAVENOUS | Status: AC
  Administered 2012-09-22: 1000 mL via INTRAVENOUS

## 2012-09-22 NOTE — ED Notes (Signed)
Discharge instructions reviewed. Pt verbalized understanding.  

## 2012-09-22 NOTE — ED Provider Notes (Signed)
History  This chart was scribed for Taylor Octave, MD by Bennett Scrape, ED Scribe. This patient was seen in room A04C/A04C and the patient's care was started at 10:00 AM.  CSN: [id number]  Arrival date & time 09/22/12  0941   First MD Initiated Contact with Patient 09/22/12 1000      Chief Complaint  Patient presents with  . Vaginal Bleeding    The history is provided by the patient. No language interpreter was used.    Taylor Bautista is a 22 y.o. female who is [redacted] weeks pregnant who presents to the Emergency Department complaining of 2 hours of sudden onset, non-changing, intermittent vaginal bleeding described as light spotting with associated palpitations described as a racing heartbeat, light-headedness and dizziness. She denies dizziness and palpitations currently. She states that she has not seen her PCP for the pregnancy yet and; therefore, has not had an [place] to confirm the pregnancy yet. She is G4P1A2 and states that her first child was born full term with no complications. She reports that she was seen in the ED for stress and dehydration during that pregnancy but states that she was otherwise healthy. She denies fever, chills, nausea, emesis, vaginal discharge, urinary symptoms, abdominal pain and CP as associated symptoms. She has a h/o MRSA and dysmenorrhea and is an occasional alcohol user (quit 8 weeks ago) and former smoker.  Past Medical History  Diagnosis Date  . Allergy   . Dysthymic disorder   . Dysmenorrhea   . Exposure to STD   . MRSA infection   . Tobacco use disorder     Past Surgical History  Procedure Date  . Pigmented nevus removal 04/1989    Family History  Problem Relation Age of Onset  . Cancer Mother     breast  . Cancer Cousin 24    breast  . Anxiety disorder Other     History  Substance Use Topics  . Smoking status: Former Games developer  . Smokeless tobacco: Not on file  . Alcohol Use: Yes    OB History    Grav Para Term Preterm Abortions  TAB SAB Ect Mult Living   4 1   2     1       Review of Systems  A complete 10 system review of systems was obtained and all systems are negative except as noted in the HPI and PMH.   Allergies  Review of patient's allergies indicates no known allergies.  Home Medications   Current Outpatient Rx  Name  Route  Sig  Dispense  Refill  . PRENATAL MULTIVITAMIN CH   Oral   Take 1 tablet by mouth daily.         Marland Kitchen CLOTRIMAZOLE 1 % VA CREA   Vaginal   Place 1 Applicatorful vaginally at bedtime.   45 g   0   . NITROFURANTOIN MONOHYD MACRO 100 MG PO CAPS   Oral   Take 1 capsule (100 mg total) by mouth 2 (two) times daily.   10 capsule   0     Triage Vitals: BP 103/64  Pulse 91  Temp 98.7 F (37.1 C) (Oral)  Resp 18  SpO2 99%  Physical Exam  Nursing note and vitals reviewed. Constitutional: She is oriented to person, place, and time. She appears well-developed and well-nourished. No distress.  HENT:  Head: Normocephalic and atraumatic.  Mouth/Throat: Oropharynx is clear and moist.  Eyes: Conjunctivae normal and EOM are normal. Pupils are equal, round, and  reactive to light.  Neck: Neck supple. No tracheal deviation present.  Cardiovascular: Normal rate and regular rhythm.   Pulmonary/Chest: Effort normal and breath sounds normal. No respiratory distress.  Abdominal: Soft. Bowel sounds are normal. There is no tenderness. There is no rebound and no guarding.  Genitourinary: Vaginal discharge found.       White vaginal discharge. No CMT, no adnexal tenderness, cervix closed, no bleeding  Musculoskeletal: Normal range of motion.  Neurological: She is alert and oriented to person, place, and time.  Skin: Skin is warm and dry.  Psychiatric: She has a normal mood and affect. Her behavior is normal.    ED Course  Procedures (including critical care time)  DIAGNOSTIC STUDIES: Oxygen Saturation is 99% on room air, normal by my interpretation.    COORDINATION OF  CARE: 10:04 AM-Discussed treatment plan which includes [place], CBC, UA and pelvic exam with pt at bedside and pt agreed to plan.   10:15 AM- Ordered 1,000 mL of bolus  1:05 PM- Pt rechecked and reports improvement in symptoms with bolus. Informed pt of US showing 7 week pregnancy, normal fetus placement. Advised pt that I will be prescribed antibiotics for a questionable urine and pt agreed. Pt states she will be following up with [facility] for her pregnancy.  Labs Reviewed  URINALYSIS, ROUTINE W REFLEX MICROSCOPIC - Abnormal; Notable for the following:    APPearance CLOUDY (*)     Leukocytes, UA MODERATE (*)     All other components within normal limits  CBC WITH DIFFERENTIAL - Abnormal; Notable for the following:    RBC 3.86 (*)     Hemoglobin 11.9 (*)     HCT 34.2 (*)     All other components within normal limits  HCG, QUANTITATIVE, PREGNANCY - Abnormal; Notable for the following:    hCG, Beta Chain, Quant, S 13086 (*)     All other components within normal limits  WET PREP, GENITAL - Abnormal; Notable for the following:    Yeast Wet Prep HPF POC FEW (*)     WBC, Wet Prep HPF POC MODERATE (*)     All other components within normal limits  POCT PREGNANCY, URINE - Abnormal; Notable for the following:    Preg Test, Ur POSITIVE (*)     All other components within normal limits  URINE MICROSCOPIC-ADD ON - Abnormal; Notable for the following:    Squamous Epithelial / LPF FEW (*)     Casts HYALINE CASTS (*)     All other components within normal limits  ABO/RH  BASIC METABOLIC PANEL  GC/CHLAMYDIA PROBE AMP   US Ob Comp Less 14 Wks  09/22/2012  *RADIOLOGY REPORT*  Clinical Data: Early pregnancy.  Abdominal pain and bleeding.  OBSTETRIC <14 WK [place] AND TRANSVAGINAL OB US  Technique: Both transabdominal and transvaginal ultrasound examinations were performed for complete evaluation of the gestation as well as the maternal uterus, adnexal regions, and pelvic cul-de-sac.  Findings: Single  intrauterine gestational sac noted.  Embryo visible with crown-rump length 10.1 mm compatible with 7 weeks 1 day gestation.  Embryonic cardiac activity is present at 132 beats per minute.  The yolk sac is visible.  No subchorionic hemorrhage.  There is a 1.7 x 1.2 x 1.2 cm cyst in the left ovary.  Right ovary unremarkable.  No free pelvic fluid.  IMPRESSION:  1.  Single living intrauterine pregnancy measuring at 7 weeks 1 day gestation.  No subchorionic hemorrhage or other adverse sonographic indicators. 2.  Small cyst of the left ovary.   Original Report Authenticated By: Gaylyn Rong, M.D.    US Ob Transvaginal  09/22/2012  *RADIOLOGY REPORT*  Clinical Data: Early pregnancy.  Abdominal pain and bleeding.  OBSTETRIC <14 WK [place] AND TRANSVAGINAL OB US  Technique: Both transabdominal and transvaginal ultrasound examinations were performed for complete evaluation of the gestation as well as the maternal uterus, adnexal regions, and pelvic cul-de-sac.  Findings: Single intrauterine gestational sac noted.  Embryo visible with crown-rump length 10.1 mm compatible with 7 weeks 1 day gestation.  Embryonic cardiac activity is present at 132 beats per minute.  The yolk sac is visible.  No subchorionic hemorrhage.  There is a 1.7 x 1.2 x 1.2 cm cyst in the left ovary.  Right ovary unremarkable.  No free pelvic fluid.  IMPRESSION:  1.  Single living intrauterine pregnancy measuring at 7 weeks 1 day gestation.  No subchorionic hemorrhage or other adverse sonographic indicators. 2.  Small cyst of the left ovary.   Original Report Authenticated By: Gaylyn Rong, M.D.      1. Intrauterine pregnancy   2. Urinary tract infection   3. Candidiasis       MDM  Lightheadedness, dizziness, palpitations, abdominal cramping with vaginal spotting since this morning. Last menstrual period of November. Has not seen an obstetrician. G4P1 at approximately [redacted] weeks gestation.  Vitals stable. Abdomen soft and  nontender.  IUP confirmed on ultrasound at 7 weeks with heart rate of 132. Pelvic exam shows white discharge no CMT. Treated for candidiasis and question UTI.  Followup with OB. Maintain hydration. Return precautions discussed  Date: 09/22/2012  Rate: 94  Rhythm: normal sinus rhythm  QRS Axis: normal  Intervals: normal  ST/T Wave abnormalities: normal  Conduction Disutrbances:right bundle branch block  Narrative Interpretation:   Old EKG Reviewed: changes noted   I personally performed the services described in this documentation, which was scribed in my presence. The recorded information has been reviewed and is accurate.      Taylor Octave, MD 09/22/12 1659

## 2012-09-22 NOTE — ED Notes (Signed)
lightheaded and dizzy since this am   Felt like her heart was beating fast when this happened states is 8 weeks preg she states has not seen a dr yet G4 P1 A 2 L1

## 2012-09-22 NOTE — ED Notes (Signed)
Also states that she had some spotting this am and then it stopped

## 2012-09-23 LAB — GC/CHLAMYDIA PROBE AMP
CT Probe RNA: NEGATIVE
GC Probe RNA: NEGATIVE

## 2012-09-25 ENCOUNTER — Emergency Department (HOSPITAL_COMMUNITY)
Admission: EM | Admit: 2012-09-25 | Discharge: 2012-09-25 | Disposition: A | Discharge status: Home / Self Care | Payer: 59 | Attending: Emergency Medicine | Admitting: Emergency Medicine

## 2012-09-25 ENCOUNTER — Encounter (HOSPITAL_COMMUNITY): Payer: Self-pay | Admitting: *Deleted

## 2012-09-25 DIAGNOSIS — Z87891 Personal history of nicotine dependence: Secondary | ICD-10-CM | POA: Insufficient documentation

## 2012-09-25 DIAGNOSIS — Z8614 Personal history of Methicillin resistant Staphylococcus aureus infection: Secondary | ICD-10-CM | POA: Insufficient documentation

## 2012-09-25 DIAGNOSIS — R42 Dizziness and giddiness: Secondary | ICD-10-CM | POA: Insufficient documentation

## 2012-09-25 DIAGNOSIS — O210 Mild hyperemesis gravidarum: Secondary | ICD-10-CM

## 2012-09-25 DIAGNOSIS — N390 Urinary tract infection, site not specified: Secondary | ICD-10-CM | POA: Insufficient documentation

## 2012-09-25 DIAGNOSIS — O3100X0 Papyraceous fetus, unspecified trimester, not applicable or unspecified: Secondary | ICD-10-CM | POA: Insufficient documentation

## 2012-09-25 LAB — URINALYSIS, ROUTINE W REFLEX MICROSCOPIC
Glucose, UA: NEGATIVE mg/dL
Hgb urine dipstick: NEGATIVE
Protein, ur: NEGATIVE mg/dL
Specific Gravity, Urine: 1.029 (ref 1.005–1.030)

## 2012-09-25 LAB — CBC WITH DIFFERENTIAL/PLATELET
Eosinophils Relative: 0 % (ref 0–5)
HCT: 33.9 % — ABNORMAL LOW (ref 36.0–46.0)
Hemoglobin: 11.4 g/dL — ABNORMAL LOW (ref 12.0–15.0)
Lymphocytes Relative: 20 % (ref 12–46)
Lymphs Abs: 1.1 10*3/uL (ref 0.7–4.0)
MCV: 87.8 fL (ref 78.0–100.0)
Monocytes Relative: 9 % (ref 3–12)
Platelets: 219 10*3/uL (ref 150–400)
RBC: 3.86 MIL/uL — ABNORMAL LOW (ref 3.87–5.11)
WBC: 5.6 10*3/uL (ref 4.0–10.5)

## 2012-09-25 LAB — BASIC METABOLIC PANEL
BUN: 11 mg/dL (ref 6–23)
CO2: 23 mEq/L (ref 19–32)
Calcium: 9.5 mg/dL (ref 8.4–10.5)
Glucose, Bld: 104 mg/dL — ABNORMAL HIGH (ref 70–99)
Sodium: 135 mEq/L (ref 135–145)

## 2012-09-25 MED ORDER — SODIUM CHLORIDE 0.9 % IV BOLUS (SEPSIS)
2000.0000 mL | Freq: Once | INTRAVENOUS | Status: AC
  Administered 2012-09-25: 2000 mL via INTRAVENOUS

## 2012-09-25 MED ORDER — ONDANSETRON HCL 4 MG PO TABS: 4.0000 mg | ORAL_TABLET | Freq: Three times a day (TID) | ORAL | Status: DC | PRN

## 2012-09-25 MED ORDER — ONDANSETRON HCL 4 MG/2ML IJ SOLN
4.0000 mg | Freq: Once | INTRAMUSCULAR | Status: AC
  Administered 2012-09-25: 4 mg via INTRAVENOUS
  Filled 2012-09-25: qty 2

## 2012-09-25 NOTE — ED Provider Notes (Signed)
History     CSN: [id number]  Arrival date & time 09/25/12  1015   First MD Initiated Contact with Patient 09/25/12 1313      Chief Complaint  Patient presents with  . Morning Sickness  . Dizziness    (Consider location/radiation/quality/duration/timing/severity/associated sxs/prior treatment) HPI Pt approximately [redacted]wks pregnant, had [place] at recent ED visit showing IUP. She reports persistent nausea, multiple episodes of vomiting, unable to keep anything down. Denies any abdominal pain or vaginal discharge. She was also found to have UTI at recent visit but did not get Abx filled yet.   Past Medical History  Diagnosis Date  . Allergy   . Dysthymic disorder   . Dysmenorrhea   . Exposure to STD   . MRSA infection   . Tobacco use disorder     Past Surgical History  Procedure Date  . Pigmented nevus removal 04/1989    Family History  Problem Relation Age of Onset  . Cancer Mother     breast  . Cancer Cousin 24    breast  . Anxiety disorder Other     History  Substance Use Topics  . Smoking status: Former Games developer  . Smokeless tobacco: Not on file  . Alcohol Use: Yes    OB History    Grav Para Term Preterm Abortions TAB SAB Ect Mult Living   1               Review of Systems All other systems reviewed and are negative except as noted in HPI.   Allergies  Review of patient's allergies indicates no known allergies.  Home Medications   Current Outpatient Rx  Name  Route  Sig  Dispense  Refill  . CLOTRIMAZOLE 1 % VA CREA   Vaginal   Place 1 Applicatorful vaginally at bedtime.   45 g   0   . NITROFURANTOIN MONOHYD MACRO 100 MG PO CAPS   Oral   Take 1 capsule (100 mg total) by mouth 2 (two) times daily.   10 capsule   0   . PRENATAL MULTIVITAMIN CH   Oral   Take 1 tablet by mouth at bedtime.            BP 104/62  Pulse 71  Temp 98.7 F (37.1 C) (Oral)  Resp 18  SpO2 100%  LMP 07/17/2012  Physical Exam  Nursing note and vitals  reviewed. Constitutional: She is oriented to person, place, and time. She appears well-developed and well-nourished.  HENT:  Head: Normocephalic and atraumatic.  Eyes: EOM are normal. Pupils are equal, round, and reactive to light.  Neck: Normal range of motion. Neck supple.  Cardiovascular: Normal rate, normal heart sounds and intact distal pulses.   Pulmonary/Chest: Effort normal and breath sounds normal.  Abdominal: Bowel sounds are normal. She exhibits no distension. There is no tenderness.  Musculoskeletal: Normal range of motion. She exhibits no edema and no tenderness.  Neurological: She is alert and oriented to person, place, and time. She has normal strength. No cranial nerve deficit or sensory deficit.  Skin: Skin is warm and dry. No rash noted.  Psychiatric: She has a normal mood and affect.    ED Course  Procedures (including critical care time)  Labs Reviewed  CBC WITH DIFFERENTIAL - Abnormal; Notable for the following:    RBC 3.86 (*)     Hemoglobin 11.4 (*)     HCT 33.9 (*)     All other components within normal limits  BASIC  METABOLIC PANEL - Abnormal; Notable for the following:    Potassium 3.3 (*)     Glucose, Bld 104 (*)     All other components within normal limits  URINALYSIS, ROUTINE W REFLEX MICROSCOPIC - Abnormal; Notable for the following:    APPearance HAZY (*)     Bilirubin Urine SMALL (*)     Ketones, ur >80 (*)     All other components within normal limits   No results found.   No diagnosis found.    MDM  Pt with Ketonuria, but otherwise feeling better. IVF infusing now. Plan for discharge after bolus is complete. No evidence of UTI today. Advised to followup with [facility] or other [facility] for any further pregnancy related problems.         Joyce Heitman B. Bernette Mayers, MD 09/25/12 [phone]

## 2012-09-25 NOTE — ED Notes (Signed)
Pt reports she feels weaker than normal because she hasn't been able to eat much and when she does eat or drink she has been vomiting up. Pt reports he started a few days ago and came to [facility] on Friday for same symptoms but feels she isn't getting any better. Pt reports she has been having some abd cramping, had ultrasound on Friday and everything was fine with the baby.

## 2012-09-25 NOTE — ED Notes (Signed)
Pt is eight weeks pregnant and was here with dizziness, nausea, and weakness.  Pt states she has hardly ate in the last 3 days.  Pt states she has been vomiting since waking up and patient just feels so weak.  Pt states she does have some lower abdominal cramps.  No vaginal bleeding or discharge since last seen here.  Pt was treated for uti and yeast infection on thursday

## 2012-09-29 ENCOUNTER — Emergency Department (HOSPITAL_COMMUNITY)
Admission: EM | Admit: 2012-09-29 | Discharge: 2012-09-29 | Disposition: A | Discharge status: Home / Self Care | Payer: Medicaid Other | Attending: Emergency Medicine | Admitting: Emergency Medicine

## 2012-09-29 ENCOUNTER — Encounter (HOSPITAL_COMMUNITY): Payer: Self-pay | Admitting: Emergency Medicine

## 2012-09-29 DIAGNOSIS — Z8614 Personal history of Methicillin resistant Staphylococcus aureus infection: Secondary | ICD-10-CM | POA: Insufficient documentation

## 2012-09-29 DIAGNOSIS — R109 Unspecified abdominal pain: Secondary | ICD-10-CM | POA: Insufficient documentation

## 2012-09-29 DIAGNOSIS — Z87891 Personal history of nicotine dependence: Secondary | ICD-10-CM | POA: Insufficient documentation

## 2012-09-29 DIAGNOSIS — Z8659 Personal history of other mental and behavioral disorders: Secondary | ICD-10-CM | POA: Insufficient documentation

## 2012-09-29 DIAGNOSIS — Z8742 Personal history of other diseases of the female genital tract: Secondary | ICD-10-CM | POA: Insufficient documentation

## 2012-09-29 DIAGNOSIS — O200 Threatened abortion: Secondary | ICD-10-CM

## 2012-09-29 DIAGNOSIS — R110 Nausea: Secondary | ICD-10-CM | POA: Insufficient documentation

## 2012-09-29 LAB — URINE MICROSCOPIC-ADD ON

## 2012-09-29 LAB — POCT PREGNANCY, URINE: Preg Test, Ur: POSITIVE — AB

## 2012-09-29 LAB — URINALYSIS, ROUTINE W REFLEX MICROSCOPIC
Glucose, UA: NEGATIVE mg/dL
Leukocytes, UA: NEGATIVE
pH: 5.5 (ref 5.0–8.0)

## 2012-09-29 LAB — CBC WITH DIFFERENTIAL/PLATELET
Basophils Absolute: 0 10*3/uL (ref 0.0–0.1)
HCT: 35.3 % — ABNORMAL LOW (ref 36.0–46.0)
Hemoglobin: 12.6 g/dL (ref 12.0–15.0)
Lymphocytes Relative: 23 % (ref 12–46)
Monocytes Absolute: 0.6 10*3/uL (ref 0.1–1.0)
Monocytes Relative: 9 % (ref 3–12)
Neutro Abs: 4.4 10*3/uL (ref 1.7–7.7)
WBC: 6.6 10*3/uL (ref 4.0–10.5)

## 2012-09-29 LAB — BASIC METABOLIC PANEL
BUN: 13 mg/dL (ref 6–23)
CO2: 23 mEq/L (ref 19–32)
Chloride: 100 mEq/L (ref 96–112)
Creatinine, Ser: 0.47 mg/dL — ABNORMAL LOW (ref 0.50–1.10)

## 2012-09-29 LAB — HCG, QUANTITATIVE, PREGNANCY: hCG, Beta Chain, Quant, S: 101638 m[IU]/mL — ABNORMAL HIGH (ref <5)

## 2012-09-29 NOTE — ED Provider Notes (Signed)
History     CSN: [id number]  Arrival date & time 09/29/12  1529   First MD Initiated Contact with Patient 09/29/12 1912      Chief Complaint  Patient presents with  . Vaginal Bleeding    (Consider location/radiation/quality/duration/timing/severity/associated sxs/prior treatment) HPI Comments: 22 y/o F G3P1 who states she is [redacted] weeks pregnant p/w vaginal bleeding. Has had intermittent mild spotting over past week. None past 2 days until today. About 6-7 hours ago began to have heavier spotting over the course of 3 hours. Did not fill one pad. Associated cramping. VB has resolved. Cramping still present. No fevers.   Patient is a 22 y.o. female presenting with vaginal bleeding. The history is provided by the patient.  Vaginal Bleeding This is a recurrent problem. The current episode started today. Episode frequency: over 3 hours. The problem has been resolved. Associated symptoms include abdominal pain (mild suprapubic cramping.) and nausea. Pertinent negatives include no change in bowel habit, chest pain, chills, congestion, coughing, fatigue, fever, headaches, rash, urinary symptoms or vomiting. Nothing aggravates the symptoms. She has tried nothing for the symptoms.    Past Medical History  Diagnosis Date  . Allergy   . Dysthymic disorder   . Dysmenorrhea   . Exposure to STD   . MRSA infection   . Tobacco use disorder     Past Surgical History  Procedure Date  . Pigmented nevus removal 04/1989    Family History  Problem Relation Age of Onset  . Cancer Mother     breast  . Cancer Cousin 24    breast  . Anxiety disorder Other     History  Substance Use Topics  . Smoking status: Former Games developer  . Smokeless tobacco: Not on file  . Alcohol Use: Yes    OB History    Grav Para Term Preterm Abortions TAB SAB Ect Mult Living   1               Review of Systems  Constitutional: Negative for fever, chills and fatigue.  HENT: Negative for congestion and rhinorrhea.     Eyes: Negative for pain and visual disturbance.  Respiratory: Negative for cough and shortness of breath.   Cardiovascular: Negative for chest pain and leg swelling.  Gastrointestinal: Positive for nausea and abdominal pain (mild suprapubic cramping.). Negative for vomiting and change in bowel habit.  Genitourinary: Positive for vaginal bleeding. Negative for dysuria, flank pain and difficulty urinating.  Skin: Negative for color change and rash.  Neurological: Negative for dizziness and headaches.  All other systems reviewed and are negative.    Allergies  Review of patient's allergies indicates no known allergies.  Home Medications   Current Outpatient Rx  Name  Route  Sig  Dispense  Refill  . NITROFURANTOIN MONOHYD MACRO 100 MG PO CAPS   Oral   Take 1 capsule (100 mg total) by mouth 2 (two) times daily.   10 capsule   0   . OVER THE COUNTER MEDICATION   Topical   Apply 1 application topically at bedtime. Over the counter cream for Urinary Tract Infection-name unknown         . PRENATAL MULTIVITAMIN CH   Oral   Take 1 tablet by mouth daily.            BP 106/54  Pulse 70  Temp 97.8 F (36.6 C) (Oral)  Resp 16  SpO2 100%  LMP 07/17/2012  Physical Exam  Constitutional: She is oriented to  person, place, and time. She appears well-developed and well-nourished. No distress.  HENT:  Head: Normocephalic and atraumatic.  Mouth/Throat: Oropharynx is clear and moist.  Eyes: Conjunctivae normal are normal. Right eye exhibits no discharge. Left eye exhibits no discharge.  Neck: No tracheal deviation present.  Cardiovascular: Normal rate, regular rhythm, normal heart sounds and intact distal pulses.   Pulmonary/Chest: Effort normal and breath sounds normal. No stridor. No respiratory distress. She has no wheezes. She has no rales. She exhibits no tenderness.  Abdominal: Soft. She exhibits no distension. There is tenderness (mild suprapubic discomfort). There is no  rebound and no guarding.  Genitourinary: Vagina normal. There is no rash, tenderness, lesion or injury on the right labia. There is no rash, tenderness, lesion or injury on the left labia. Cervix exhibits discharge (minimal white discharge). Cervix exhibits no motion tenderness and no friability.       Os closed. No active bleeding. Minimal dark blood in vault.  Musculoskeletal: She exhibits no edema and no tenderness.  Neurological: She is alert and oriented to person, place, and time.  Skin: Skin is warm and dry.    ED Course  Procedures (including critical care time)  Labs Reviewed  URINALYSIS, ROUTINE W REFLEX MICROSCOPIC - Abnormal; Notable for the following:    Specific Gravity, Urine 1.031 (*)     Hgb urine dipstick TRACE (*)     All other components within normal limits  CBC WITH DIFFERENTIAL - Abnormal; Notable for the following:    HCT 35.3 (*)     All other components within normal limits  BASIC METABOLIC PANEL - Abnormal; Notable for the following:    Creatinine, Ser 0.47 (*)     All other components within normal limits  HCG, QUANTITATIVE, PREGNANCY - Abnormal; Notable for the following:    hCG, Beta Francene Finders [id number] (*)     All other components within normal limits  POCT PREGNANCY, URINE - Abnormal; Notable for the following:    Preg Test, Ur POSITIVE (*)     All other components within normal limits  URINE MICROSCOPIC-ADD ON - Abnormal; Notable for the following:    Squamous Epithelial / LPF FEW (*)     Bacteria, UA FEW (*)     All other components within normal limits   No results found.   1. Threatened abortion       MDM   22 y/o F p/w vaginal bleeding. About [redacted] weeks pregnant. Spotting. Resolved.  Os closed on exam. Bedside ultrasound with FHR. HCG rising appropriately from prior visit. Hgb stable. Urine without evidence of infection. Likely threatened abortion. HDS, af. NAD.  Patient discharged home. Return precautions given. To follow up  with OB/Gyn. patient in agreement with plan.   Labs and imaging reviewed by myself and considered in medical decision making if ordered. Imaging interpreted by radiology.   Discussed case with Dr. Rubin Payor who is in agreement with assessment and plan.       Stevie Kern, MD 09/30/12 [phone]

## 2012-09-29 NOTE — ED Notes (Signed)
Pelvic cart at the bedside 

## 2012-09-29 NOTE — ED Notes (Signed)
Pt updated on wait. Using phone to call home.

## 2012-09-29 NOTE — ED Notes (Signed)
Pt c/o vaginal bleeding; pt sts approx [redacted] weeks pregnant; pt sts abd cramping and LMP was November 1st

## 2012-09-30 NOTE — ED Provider Notes (Signed)
I saw and evaluated the patient, reviewed the resident's note and I agree wi Patient will be discharged home with threatened ABth the findings and plan.and vaginal bleeding. Pregnant. Recent workup for same.  Juliet Rude. Rubin Payor, MD 09/30/12 1453

## 2012-10-16 ENCOUNTER — Ambulatory Visit (INDEPENDENT_AMBULATORY_CARE_PROVIDER_SITE_OTHER): Payer: 59 | Admitting: *Deleted

## 2012-10-16 ENCOUNTER — Encounter: Payer: Self-pay | Admitting: *Deleted

## 2012-10-16 VITALS — BP 106/69 | Wt 109.0 lb

## 2012-10-16 DIAGNOSIS — Z3480 Encounter for supervision of other normal pregnancy, unspecified trimester: Secondary | ICD-10-CM

## 2012-10-16 MED ORDER — PRENATAL MULTIVITAMIN CH: 1.0000 | ORAL_TABLET | Freq: Every day | ORAL | Status: DC

## 2012-10-16 NOTE — Progress Notes (Signed)
Patient is doing well, she had been sick, but it seems to have passed now.  She was seen at [facility] two times.  She is interested in the first trimester screening test.

## 2012-10-17 LAB — OBSTETRIC PANEL
Basophils Absolute: 0 10*3/uL (ref 0.0–0.1)
Basophils Relative: 0 % (ref 0–1)
Eosinophils Absolute: 0.1 10*3/uL (ref 0.0–0.7)
Hemoglobin: 12.6 g/dL (ref 12.0–15.0)
Hepatitis B Surface Ag: NEGATIVE
MCH: 31.1 pg (ref 26.0–34.0)
MCHC: 34.7 g/dL (ref 30.0–36.0)
Monocytes Relative: 8 % (ref 3–12)
Neutrophils Relative %: 71 % (ref 43–77)
Platelets: 259 10*3/uL (ref 150–400)
RDW: 13.5 % (ref 11.5–15.5)
Rh Type: POSITIVE

## 2012-10-19 ENCOUNTER — Encounter: Payer: Self-pay | Admitting: Obstetrics and Gynecology

## 2012-10-19 ENCOUNTER — Other Ambulatory Visit: Payer: Self-pay | Admitting: Obstetrics and Gynecology

## 2012-10-19 DIAGNOSIS — B951 Streptococcus, group B, as the cause of diseases classified elsewhere: Secondary | ICD-10-CM | POA: Insufficient documentation

## 2012-10-19 DIAGNOSIS — O2340 Unspecified infection of urinary tract in pregnancy, unspecified trimester: Secondary | ICD-10-CM

## 2012-10-19 MED ORDER — PENICILLIN V POTASSIUM 500 MG PO TABS: 500.0000 mg | ORAL_TABLET | Freq: Four times a day (QID) | ORAL | Status: DC

## 2012-10-20 ENCOUNTER — Encounter: Payer: Self-pay | Admitting: Obstetrics and Gynecology

## 2012-10-20 ENCOUNTER — Ambulatory Visit (INDEPENDENT_AMBULATORY_CARE_PROVIDER_SITE_OTHER): Payer: 59 | Admitting: Obstetrics and Gynecology

## 2012-10-20 ENCOUNTER — Other Ambulatory Visit: Payer: Self-pay | Admitting: Obstetrics and Gynecology

## 2012-10-20 VITALS — BP 104/67 | Wt 112.0 lb

## 2012-10-20 DIAGNOSIS — B951 Streptococcus, group B, as the cause of diseases classified elsewhere: Secondary | ICD-10-CM

## 2012-10-20 DIAGNOSIS — Z113 Encounter for screening for infections with a predominantly sexual mode of transmission: Secondary | ICD-10-CM

## 2012-10-20 DIAGNOSIS — Z124 Encounter for screening for malignant neoplasm of cervix: Secondary | ICD-10-CM

## 2012-10-20 DIAGNOSIS — Z3682 Encounter for antenatal screening for nuchal translucency: Secondary | ICD-10-CM

## 2012-10-20 DIAGNOSIS — N390 Urinary tract infection, site not specified: Secondary | ICD-10-CM

## 2012-10-20 DIAGNOSIS — Z3480 Encounter for supervision of other normal pregnancy, unspecified trimester: Secondary | ICD-10-CM | POA: Insufficient documentation

## 2012-10-20 DIAGNOSIS — O2390 Unspecified genitourinary tract infection in pregnancy, unspecified trimester: Secondary | ICD-10-CM

## 2012-10-20 NOTE — Patient Instructions (Signed)
Pregnancy - First Trimester During sexual intercourse, millions of sperm go into the vagina. Only 1 sperm will penetrate and fertilize the female egg while it is in the Fallopian tube. One week later, the fertilized egg implants into the wall of the uterus. An embryo begins to develop into a baby. At 6 to 8 weeks, the eyes and face are formed and the heartbeat can be seen on ultrasound. At the end of 12 weeks (first trimester), all the baby's organs are formed. Now that you are pregnant, you will want to do everything you can to have a healthy baby. Two of the most important things are to get good prenatal care and follow your caregiver's instructions. Prenatal care is all the medical care you receive before the baby's birth. It is given to prevent, find, and treat problems during the pregnancy and childbirth. PRENATAL EXAMS  During prenatal visits, your weight, blood pressure and urine are checked. This is done to make sure you are healthy and progressing normally during the pregnancy.  A pregnant woman should gain 25 to 30 pounds during the pregnancy. However, if you are over weight or underweight, your caregiver will advise you regarding your weight.  Your caregiver will ask and answer questions for you.  Blood work, cervical cultures, other necessary tests and a Pap test are done during your prenatal exams. These tests are done to check on your health and the probable health of your baby. Tests are strongly recommended and done for HIV with your permission. This is the virus that causes AIDS. These tests are done because medications can be given to help prevent your baby from being born with this infection should you have been infected without knowing it. Blood work is also used to find out your blood type, previous infections and follow your blood levels (hemoglobin).  Low hemoglobin (anemia) is common during pregnancy. Iron and vitamins are given to help prevent this. Later in the pregnancy,  blood tests for diabetes will be done along with any other tests if any problems develop. You may need tests to make sure you and the baby are doing well.  You may need other tests to make sure you and the baby are doing well. CHANGES DURING THE FIRST TRIMESTER (THE FIRST 3 MONTHS OF PREGNANCY) Your body goes through many changes during pregnancy. They vary from person to person. Talk to your caregiver about changes you notice and are concerned about. Changes can include:  Your menstrual period stops.  The egg and sperm carry the genes that determine what you look like. Genes from you and your partner are forming a baby. The female genes determine whether the baby is a boy or a girl.  Your body increases in girth and you may feel bloated.  Feeling sick to your stomach (nauseous) and throwing up (vomiting). If the vomiting is uncontrollable, call your caregiver.  Your breasts will begin to enlarge and become tender.  Your nipples may stick out more and become darker.  The need to urinate more. Painful urination may mean you have a bladder infection.  Tiring easily.  Loss of appetite.  Cravings for certain kinds of food.  At first, you may gain or lose a couple of pounds.  You may have changes in your emotions from day to day (excited to be pregnant or concerned something may go wrong with the pregnancy and baby).  You may have more vivid and strange dreams. HOME CARE INSTRUCTIONS   It is very important  to avoid all smoking, alcohol and un-prescribed drugs during your pregnancy. These affect the formation and growth of the baby. Avoid chemicals while pregnant to ensure the delivery of a healthy infant.  Start your prenatal visits by the 12th week of pregnancy. They are usually scheduled monthly at first, then more often in the last 2 months before delivery. Keep your caregiver's appointments. Follow your caregiver's instructions regarding medication use, blood and lab tests, exercise,  and diet.  During pregnancy, you are providing food for you and your baby. Eat regular, well-balanced meals. Choose foods such as meat, fish, milk and other low fat dairy products, vegetables, fruits, and whole-grain breads and cereals. Your caregiver will tell you of the ideal weight gain.  You can help morning sickness by keeping soda crackers at the bedside. Eat a couple before arising in the morning. You may want to use the crackers without salt on them.  Eating 4 to 5 small meals rather than 3 large meals a day also may help the nausea and vomiting.  Drinking liquids between meals instead of during meals also seems to help nausea and vomiting.  A physical sexual relationship may be continued throughout pregnancy if there are no other problems. Problems may be early (premature) leaking of amniotic fluid from the membranes, vaginal bleeding, or belly (abdominal) pain.  Exercise regularly if there are no restrictions. Check with your caregiver or physical therapist if you are unsure of the safety of some of your exercises. Greater weight gain will occur in the last 2 trimesters of pregnancy. Exercising will help:  Control your weight.  Keep you in shape.  Prepare you for labor and delivery.  Help you lose your pregnancy weight after you deliver your baby.  Wear a good support or jogging bra for breast tenderness during pregnancy. This may help if worn during sleep too.  Ask when prenatal classes are available. Begin classes when they are offered.  Do not use hot tubs, steam rooms or saunas.  Wear your seat belt when driving. This protects you and your baby if you are in an accident.  Avoid raw meat, uncooked cheese, cat litter boxes and soil used by cats throughout the pregnancy. These carry germs that can cause birth defects in the baby.  The first trimester is a good time to visit your dentist for your dental health. Getting your teeth cleaned is OK. Use a softer toothbrush and  brush gently during pregnancy.  Ask for help if you have financial, counseling or nutritional needs during pregnancy. Your caregiver will be able to offer counseling for these needs as well as refer you for other special needs.  Do not take any medications or herbs unless told by your caregiver.  Inform your caregiver if there is any mental or physical domestic violence.  Make a list of emergency phone numbers of family, friends, hospital, and police and fire departments.  Write down your questions. Take them to your prenatal visit.  Do not douche.  Do not cross your legs.  If you have to stand for long periods of time, rotate you feet or take small steps in a circle.  You may have more vaginal secretions that may require a sanitary pad. Do not use tampons or scented sanitary pads. MEDICATIONS AND DRUG USE IN PREGNANCY  Take prenatal vitamins as directed. The vitamin should contain 1 milligram of folic acid. Keep all vitamins out of reach of children. Only a couple vitamins or tablets containing iron may be  fatal to a baby or young child when ingested.  Avoid use of all medications, including herbs, over-the-counter medications, not prescribed or suggested by your caregiver. Only take over-the-counter or prescription medicines for pain, discomfort, or fever as directed by your caregiver. Do not use aspirin, ibuprofen, or naproxen unless directed by your caregiver.  Let your caregiver also know about herbs you may be using.  Alcohol is related to a number of birth defects. This includes fetal alcohol syndrome. All alcohol, in any form, should be avoided completely. Smoking will cause low birth rate and premature babies.  Street or illegal drugs are very harmful to the baby. They are absolutely forbidden. A baby born to an addicted mother will be addicted at birth. The baby will go through the same withdrawal an adult does.  Let your caregiver know about any medications that you have to  take and for what reason you take them. MISCARRIAGE IS COMMON DURING PREGNANCY A miscarriage does not mean you did something wrong. It is not a reason to worry about getting pregnant again. Your caregiver will help you with questions you may have. If you have a miscarriage, you may need minor surgery. SEEK MEDICAL CARE IF:  You have any concerns or worries during your pregnancy. It is better to call with your questions if you feel they cannot wait, rather than worry about them. SEEK IMMEDIATE MEDICAL CARE IF:   An unexplained oral temperature above 100.4 F (38 C) develops, or as your caregiver suggests.  You have leaking of fluid from the vagina (birth canal). If leaking membranes are suspected, take your temperature and inform your caregiver of this when you call.  There is vaginal spotting or bleeding. Notify your caregiver of the amount and how many pads are used.  You develop a bad smelling vaginal discharge with a change in the color.  You continue to feel sick to your stomach (nauseated) and have no relief from remedies suggested. You vomit blood or coffee ground-like materials.  You lose more than 2 pounds of weight in 1 week.  You gain more than 2 pounds of weight in 1 week and you notice swelling of your face, hands, feet, or legs.  You gain 5 pounds or more in 1 week (even if you do not have swelling of your hands, face, legs, or feet).  You get exposed to [place] measles and have never had them.  You are exposed to fifth disease or chickenpox.  You develop belly (abdominal) pain. Round ligament discomfort is a common non-cancerous (benign) cause of abdominal pain in pregnancy. Your caregiver still must evaluate this.  You develop headache, fever, diarrhea, pain with urination, or shortness of breath.  You fall or are in a car accident or have any kind of trauma.  There is mental or physical violence in your home. Document Released: 08/24/2001 Document Revised: 11/22/2011  Document Reviewed: 02/25/2009 [facility] Patient Information 2013 [place], [place].
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
## 2012-10-25 ENCOUNTER — Encounter (HOSPITAL_COMMUNITY): Payer: Self-pay | Admitting: Obstetrics and Gynecology

## 2012-10-27 ENCOUNTER — Encounter: Payer: 59 | Admitting: Obstetrics and Gynecology

## 2012-11-01 ENCOUNTER — Ambulatory Visit (HOSPITAL_COMMUNITY)
Admission: RE | Admit: 2012-11-01 | Discharge: 2012-11-01 | Disposition: A | Discharge status: Home / Self Care | Payer: 59 | Source: Ambulatory Visit | Attending: Obstetrics and Gynecology | Admitting: Obstetrics and Gynecology

## 2012-11-01 ENCOUNTER — Encounter (HOSPITAL_COMMUNITY): Payer: Self-pay

## 2012-11-01 VITALS — BP 112/70 | HR 100 | Wt 112.5 lb

## 2012-11-01 DIAGNOSIS — Z3682 Encounter for antenatal screening for nuchal translucency: Secondary | ICD-10-CM

## 2012-11-01 DIAGNOSIS — Z3480 Encounter for supervision of other normal pregnancy, unspecified trimester: Secondary | ICD-10-CM

## 2012-11-01 DIAGNOSIS — Z3689 Encounter for other specified antenatal screening: Secondary | ICD-10-CM | POA: Insufficient documentation

## 2012-11-01 DIAGNOSIS — O3510X0 Maternal care for (suspected) chromosomal abnormality in fetus, unspecified, not applicable or unspecified: Secondary | ICD-10-CM | POA: Insufficient documentation

## 2012-11-01 DIAGNOSIS — O2340 Unspecified infection of urinary tract in pregnancy, unspecified trimester: Secondary | ICD-10-CM

## 2012-11-01 DIAGNOSIS — B951 Streptococcus, group B, as the cause of diseases classified elsewhere: Secondary | ICD-10-CM

## 2012-11-01 DIAGNOSIS — O351XX0 Maternal care for (suspected) chromosomal abnormality in fetus, not applicable or unspecified: Secondary | ICD-10-CM | POA: Insufficient documentation

## 2012-11-01 NOTE — Progress Notes (Signed)
Taylor Bautista  was seen today for an ultrasound appointment.  See full report in [facility].  Impression: Single IUP at 12 6/7 weeks NT of 0.9 mm; nasal bone visualized First trimester aneuploidy screen performed as noted above.   Recommendations: Please do not draw triple/quad screen, though patient should be offered MSAFP for neural tube defect screening.  Recommend ultrasound at approximately 18 weeks for fetal anatomy.  Alpha Gula, MD

## 2012-11-02 ENCOUNTER — Encounter: Payer: Self-pay | Admitting: Obstetrics and Gynecology

## 2012-11-17 ENCOUNTER — Encounter: Payer: 59 | Admitting: Family Medicine

## 2012-11-28 ENCOUNTER — Encounter: Payer: Self-pay | Admitting: Family Medicine

## 2012-11-28 ENCOUNTER — Other Ambulatory Visit: Payer: Self-pay

## 2012-11-28 ENCOUNTER — Ambulatory Visit (INDEPENDENT_AMBULATORY_CARE_PROVIDER_SITE_OTHER): Payer: 59 | Admitting: Family Medicine

## 2012-11-28 VITALS — BP 125/70 | Temp 97.1°F | Wt 115.0 lb

## 2012-11-28 DIAGNOSIS — Z3480 Encounter for supervision of other normal pregnancy, unspecified trimester: Secondary | ICD-10-CM

## 2012-11-28 DIAGNOSIS — Z3482 Encounter for supervision of other normal pregnancy, second trimester: Secondary | ICD-10-CM

## 2012-11-28 NOTE — Patient Instructions (Signed)
Pregnancy - Second Trimester The second trimester of pregnancy (3 to 6 months) is a period of rapid growth for you and your baby. At the end of the sixth month, your baby is about 9 inches long and weighs 1 1/2 pounds. You will begin to feel the baby move between 18 and 20 weeks of the pregnancy. This is called quickening. Weight gain is faster. A clear fluid (colostrum) may leak out of your breasts. You may feel small contractions of the womb (uterus). This is known as false labor or Braxton-Hicks contractions. This is like a practice for labor when the baby is ready to be born. Usually, the problems with morning sickness have usually passed by the end of your first trimester. Some women develop small dark blotches (called cholasma, mask of pregnancy) on their face that usually goes away after the baby is born. Exposure to the sun makes the blotches worse. Acne may also develop in some pregnant women and pregnant women who have acne, may find that it goes away. PRENATAL EXAMS  Blood work may continue to be done during prenatal exams. These tests are done to check on your health and the probable health of your baby. Blood work is used to follow your blood levels (hemoglobin). Anemia (low hemoglobin) is common during pregnancy. Iron and vitamins are given to help prevent this. You will also be checked for diabetes between 24 and 28 weeks of the pregnancy. Some of the previous blood tests may be repeated.  The size of the uterus is measured during each visit. This is to make sure that the baby is continuing to grow properly according to the dates of the pregnancy.  Your blood pressure is checked every prenatal visit. This is to make sure you are not getting toxemia.  Your urine is checked to make sure you do not have an infection, diabetes or protein in the urine.  Your weight is checked often to make sure gains are happening at the suggested rate. This is to ensure that both you and your baby are  growing normally.  Sometimes, an ultrasound is performed to confirm the proper growth and development of the baby. This is a test which bounces harmless sound waves off the baby so your caregiver can more accurately determine due dates. Sometimes, a specialized test is done on the amniotic fluid surrounding the baby. This test is called an amniocentesis. The amniotic fluid is obtained by sticking a needle into the belly (abdomen). This is done to check the chromosomes in instances where there is a concern about possible genetic problems with the baby. It is also sometimes done near the end of pregnancy if an early delivery is required. In this case, it is done to help make sure the baby's lungs are mature enough for the baby to live outside of the womb. CHANGES OCCURING IN THE SECOND TRIMESTER OF PREGNANCY Your body goes through many changes during pregnancy. They vary from person to person. Talk to your caregiver about changes you notice that you are concerned about.  During the second trimester, you will likely have an increase in your appetite. It is normal to have cravings for certain foods. This varies from person to person and pregnancy to pregnancy.  Your lower abdomen will begin to bulge.  You may have to urinate more often because the uterus and baby are pressing on your bladder. It is also common to get more bladder infections during pregnancy (pain with urination). You can help this by   drinking lots of fluids and emptying your bladder before and after intercourse.  You may begin to get stretch marks on your hips, abdomen, and breasts. These are normal changes in the body during pregnancy. There are no exercises or medications to take that prevent this change.  You may begin to develop swollen and bulging veins (varicose veins) in your legs. Wearing support hose, elevating your feet for 15 minutes, 3 to 4 times a day and limiting salt in your diet helps lessen the problem.  Heartburn may  develop as the uterus grows and pushes up against the stomach. Antacids recommended by your caregiver helps with this problem. Also, eating smaller meals 4 to 5 times a day helps.  Constipation can be treated with a stool softener or adding bulk to your diet. Drinking lots of fluids, vegetables, fruits, and whole grains are helpful.  Exercising is also helpful. If you have been very active up until your pregnancy, most of these activities can be continued during your pregnancy. If you have been less active, it is helpful to start an exercise program such as walking.  Hemorrhoids (varicose veins in the rectum) may develop at the end of the second trimester. Warm sitz baths and hemorrhoid cream recommended by your caregiver helps hemorrhoid problems.  Backaches may develop during this time of your pregnancy. Avoid heavy lifting, wear low heal shoes and practice good posture to help with backache problems.  Some pregnant women develop tingling and numbness of their hand and fingers because of swelling and tightening of ligaments in the wrist (carpel tunnel syndrome). This goes away after the baby is born.  As your breasts enlarge, you may have to get a bigger bra. Get a comfortable, cotton, support bra. Do not get a nursing bra until the last month of the pregnancy if you will be nursing the baby.  You may get a dark line from your belly button to the pubic area called the linea nigra.  You may develop rosy cheeks because of increase blood flow to the face.  You may develop spider looking lines of the face, neck, arms and chest. These go away after the baby is born. HOME CARE INSTRUCTIONS   It is extremely important to avoid all smoking, herbs, alcohol, and unprescribed drugs during your pregnancy. These chemicals affect the formation and growth of the baby. Avoid these chemicals throughout the pregnancy to ensure the delivery of a healthy infant.  Most of your home care instructions are the same  as suggested for the first trimester of your pregnancy. Keep your caregiver's appointments. Follow your caregiver's instructions regarding medication use, exercise and diet.  During pregnancy, you are providing food for you and your baby. Continue to eat regular, well-balanced meals. Choose foods such as meat, fish, milk and other low fat dairy products, vegetables, fruits, and whole-grain breads and cereals. Your caregiver will tell you of the ideal weight gain.  A physical sexual relationship may be continued up until near the end of pregnancy if there are no other problems. Problems could include early (premature) leaking of amniotic fluid from the membranes, vaginal bleeding, abdominal pain, or other medical or pregnancy problems.  Exercise regularly if there are no restrictions. Check with your caregiver if you are unsure of the safety of some of your exercises. The greatest weight gain will occur in the last 2 trimesters of pregnancy. Exercise will help you:  Control your weight.  Get you in shape for labor and delivery.  Lose weight   after you have the baby.  Wear a good support or jogging bra for breast tenderness during pregnancy. This may help if worn during sleep. Pads or tissues may be used in the bra if you are leaking colostrum.  Do not use hot tubs, steam rooms or saunas throughout the pregnancy.  Wear your seat belt at all times when driving. This protects you and your baby if you are in an accident.  Avoid raw meat, uncooked cheese, cat litter boxes and soil used by cats. These carry germs that can cause birth defects in the baby.  The second trimester is also a good time to visit your dentist for your dental health if this has not been done yet. Getting your teeth cleaned is OK. Use a soft toothbrush. Brush gently during pregnancy.  It is easier to loose urine during pregnancy. Tightening up and strengthening the pelvic muscles will help with this problem. Practice stopping  your urination while you are going to the bathroom. These are the same muscles you need to strengthen. It is also the muscles you would use as if you were trying to stop from passing gas. You can practice tightening these muscles up 10 times a set and repeating this about 3 times per day. Once you know what muscles to tighten up, do not perform these exercises during urination. It is more likely to contribute to an infection by backing up the urine.  Ask for help if you have financial, counseling or nutritional needs during pregnancy. Your caregiver will be able to offer counseling for these needs as well as refer you for other special needs.  Your skin may become oily. If so, wash your face with mild soap, use non-greasy moisturizer and oil or cream based makeup. MEDICATIONS AND DRUG USE IN PREGNANCY  Take prenatal vitamins as directed. The vitamin should contain 1 milligram of folic acid. Keep all vitamins out of reach of children. Only a couple vitamins or tablets containing iron may be fatal to a baby or young child when ingested.  Avoid use of all medications, including herbs, over-the-counter medications, not prescribed or suggested by your caregiver. Only take over-the-counter or prescription medicines for pain, discomfort, or fever as directed by your caregiver. Do not use aspirin.  Let your caregiver also know about herbs you may be using.  Alcohol is related to a number of birth defects. This includes fetal alcohol syndrome. All alcohol, in any form, should be avoided completely. Smoking will cause low birth rate and premature babies.  Street or illegal drugs are very harmful to the baby. They are absolutely forbidden. A baby born to an addicted mother will be addicted at birth. The baby will go through the same withdrawal an adult does. SEEK MEDICAL CARE IF:  You have any concerns or worries during your pregnancy. It is better to call with your questions if you feel they cannot wait,  rather than worry about them. SEEK IMMEDIATE MEDICAL CARE IF:   An unexplained oral temperature above 102 F (38.9 C) develops, or as your caregiver suggests.  You have leaking of fluid from the vagina (birth canal). If leaking membranes are suspected, take your temperature and tell your caregiver of this when you call.  There is vaginal spotting, bleeding, or passing clots. Tell your caregiver of the amount and how many pads are used. Light spotting in pregnancy is common, especially following intercourse.  You develop a bad smelling vaginal discharge with a change in the color from clear   to white.  You continue to feel sick to your stomach (nauseated) and have no relief from remedies suggested. You vomit blood or coffee ground-like materials.  You lose more than 2 pounds of weight or gain more than 2 pounds of weight over 1 week, or as suggested by your caregiver.  You notice swelling of your face, hands, feet, or legs.  You get exposed to German measles and have never had them.  You are exposed to fifth disease or chickenpox.  You develop belly (abdominal) pain. Round ligament discomfort is a common non-cancerous (benign) cause of abdominal pain in pregnancy. Your caregiver still must evaluate you.  You develop a bad headache that does not go away.  You develop fever, diarrhea, pain with urination, or shortness of breath.  You develop visual problems, blurry, or double vision.  You fall or are in a car accident or any kind of trauma.  There is mental or physical violence at home. Document Released: 08/24/2001 Document Revised: 11/22/2011 Document Reviewed: 02/26/2009 [employer] Patient Information 2013 [employer].  Breastfeeding Deciding to breastfeed is one of the best choices you can make for you and your baby. The information that follows gives a brief overview of the benefits of breastfeeding as well as common topics surrounding breastfeeding. BENEFITS OF  BREASTFEEDING For the baby  The first milk (colostrum) helps the baby's digestive system function better.   There are antibodies in the mother's milk that help the baby fight off infections.   The baby has a lower incidence of asthma, allergies, and sudden infant death syndrome (SIDS).   The nutrients in breast milk are better for the baby than infant formulas, and breast milk helps the baby's brain grow better.   Babies who breastfeed have less gas, colic, and constipation.  For the mother  Breastfeeding helps develop a very special bond between the mother and her baby.   Breastfeeding is convenient, always available at the correct temperature, and costs nothing.   Breastfeeding burns calories in the mother and helps her lose weight that was gained during pregnancy.   Breastfeeding makes the uterus contract back down to normal size faster and slows bleeding following delivery.   Breastfeeding mothers have a lower risk of developing breast cancer.  BREASTFEEDING FREQUENCY  A healthy, full-term baby may breastfeed as often as every hour or space his or her feedings to every 3 hours.   Watch your baby for signs of hunger. Nurse your baby if he or she shows signs of hunger. How often you nurse will vary from baby to baby.   Nurse as often as the baby requests, or when you feel the need to reduce the fullness of your breasts.   Awaken the baby if it has been 3 4 hours since the last feeding.   Frequent feeding will help the mother make more milk and will help prevent problems, such as sore nipples and engorgement of the breasts.  BABY'S POSITION AT THE BREAST  Whether lying down or sitting, be sure that the baby's tummy is facing your tummy.   Support the breast with 4 fingers underneath the breast and the thumb above. Make sure your fingers are well away from the nipple and baby's mouth.   Stroke the baby's lips gently with your finger or nipple.   When the  baby's mouth is open wide enough, place all of your nipple and as much of the areola as possible into your baby's mouth.   Pull the baby in   close so the tip of the nose and the baby's cheeks touch the breast during the feeding.  FEEDINGS AND SUCTION  The length of each feeding varies from baby to baby and from feeding to feeding.   The baby must suck about 2 3 minutes for your milk to get to him or her. This is called a "let down." For this reason, allow the baby to feed on each breast as long as he or she wants. Your baby will end the feeding when he or she has received the right balance of nutrients.   To break the suction, put your finger into the corner of the baby's mouth and slide it between his or her gums before removing your breast from his or her mouth. This will help prevent sore nipples.  HOW TO TELL WHETHER YOUR BABY IS GETTING ENOUGH BREAST MILK. Wondering whether or not your baby is getting enough milk is a common concern among mothers. You can be assured that your baby is getting enough milk if:   Your baby is actively sucking and you hear swallowing.   Your baby seems relaxed and satisfied after a feeding.   Your baby nurses at least 8 12 times in a 24 hour time period. Nurse your baby until he or she unlatches or falls asleep at the first breast (at least 10 20 minutes), then offer the second side.   Your baby is wetting 5 6 disposable diapers (6 8 cloth diapers) in a 24 hour period by 5 6 days of age.   Your baby is having at least 3 4 stools every 24 hours for the first 6 weeks. The stool should be soft and yellow.   Your baby should gain 4 7 ounces per week after he or she is 4 days old.   Your breasts feel softer after nursing.  REDUCING BREAST ENGORGEMENT  In the first week after your baby is born, you may experience signs of breast engorgement. When breasts are engorged, they feel heavy, warm, full, and may be tender to the touch. You can reduce  engorgement if you:   Nurse frequently, every 2 3 hours. Mothers who breastfeed early and often have fewer problems with engorgement.   Place light ice packs on your breasts for 10 20 minutes between feedings. This reduces swelling. Wrap the ice packs in a lightweight towel to protect your skin. Bags of frozen vegetables work well for this purpose.   Take a warm shower or apply warm, moist heat to your breast for 5 10 minutes just before each feeding. This increases circulation and helps the milk flow.   Gently massage your breast before and during the feeding. Using your finger tips, massage from the chest wall towards your nipple in a circular motion.   Make sure that the baby empties at least one breast at every feeding before switching sides.   Use a breast pump to empty the breasts if your baby is sleepy or not nursing well. You may also want to pump if you are returning to work oryou feel you are getting engorged.   Avoid bottle feeds, pacifiers, or supplemental feedings of water or juice in place of breastfeeding. Breast milk is all the food your baby needs. It is not necessary for your baby to have water or formula. In fact, to help your breasts make more milk, it is best not to give your baby supplemental feedings during the early weeks.   Be sure the baby is latched   on and positioned properly while breastfeeding.   Wear a supportive bra, avoiding underwire styles.   Eat a balanced diet with enough fluids.   Rest often, relax, and take your prenatal vitamins to prevent fatigue, stress, and anemia.  If you follow these suggestions, your engorgement should improve in 24 48 hours. If you are still experiencing difficulty, call your lactation consultant or caregiver.  CARING FOR YOURSELF Take care of your breasts  Bathe or shower daily.   Avoid using soap on your nipples.   Start feedings on your left breast at one feeding and on your right breast at the next  feeding.   You will notice an increase in your milk supply 2 5 days after delivery. You may feel some discomfort from engorgement, which makes your breasts very firm and often tender. Engorgement "peaks" out within 24 48 hours. In the meantime, apply warm moist towels to your breasts for 5 10 minutes before feeding. Gentle massage and expression of some milk before feeding will soften your breasts, making it easier for your baby to latch on.   Wear a well-fitting nursing bra, and air dry your nipples for a 3 4minutes after each feeding.   Only use cotton bra pads.   Only use pure lanolin on your nipples after nursing. You do not need to wash it off before feeding the baby again. Another option is to express a few drops of breast milk and gently massage it into your nipples.  Take care of yourself  Eat well-balanced meals and nutritious snacks.   Drinking milk, fruit juice, and water to satisfy your thirst (about 8 glasses a day).   Get plenty of rest.  Avoid foods that you notice affect the baby in a bad way.  SEEK MEDICAL CARE IF:   You have difficulty with breastfeeding and need help.   You have a hard, red, sore area on your breast that is accompanied by a fever.   Your baby is too sleepy to eat well or is having trouble sleeping.   Your baby is wetting less than 6 diapers a day, by 5 days of age.   Your baby's skin or white part of his or her eyes is more yellow than it was in the hospital.   You feel depressed.  Document Released: 08/30/2005 Document Revised: 02/29/2012 Document Reviewed: 11/28/2011 [employer] Patient Information 2013 [employer].  

## 2012-11-28 NOTE — Progress Notes (Signed)
:  P=107, c/o pelvic pressure- no change,

## 2012-11-28 NOTE — Progress Notes (Signed)
Pressure sometimes. Nml NT--Needs first screen result.

## 2012-11-29 ENCOUNTER — Encounter: Payer: Self-pay | Admitting: Family Medicine

## 2012-11-29 LAB — ALPHA FETOPROTEIN, MATERNAL
AFP: 58.1 IU/mL
MoM for AFP: 1.53
Open Spina bifida: NEGATIVE
Osb Risk: 1:2580 {titer}

## 2012-12-04 ENCOUNTER — Telehealth: Payer: Self-pay | Admitting: *Deleted

## 2012-12-04 NOTE — Telephone Encounter (Signed)
Patient needs rx for prenatal vitamins.

## 2012-12-05 ENCOUNTER — Encounter: Payer: Self-pay | Admitting: Nurse Practitioner

## 2012-12-05 ENCOUNTER — Ambulatory Visit (INDEPENDENT_AMBULATORY_CARE_PROVIDER_SITE_OTHER): Payer: 59 | Admitting: Nurse Practitioner

## 2012-12-05 VITALS — BP 92/57 | HR 71 | Ht 66.0 in | Wt 116.0 lb

## 2012-12-05 DIAGNOSIS — G43009 Migraine without aura, not intractable, without status migrainosus: Secondary | ICD-10-CM | POA: Insufficient documentation

## 2012-12-05 MED ORDER — SUMATRIPTAN SUCCINATE 100 MG PO TABS: 100.0000 mg | ORAL_TABLET | Freq: Once | ORAL | Status: DC | PRN

## 2012-12-05 MED ORDER — PROMETHAZINE HCL 25 MG PO TABS: 25.0000 mg | ORAL_TABLET | Freq: Four times a day (QID) | ORAL | Status: DC | PRN

## 2012-12-05 NOTE — Patient Instructions (Signed)
Migraine Headache A migraine headache is an intense, throbbing pain on one or both sides of your head. A migraine can last for 30 minutes to several hours. CAUSES  The exact cause of a migraine headache is not always known. However, a migraine may be caused when nerves in the brain become irritated and release chemicals that cause inflammation. This causes pain. SYMPTOMS  Pain on one or both sides of your head.  Pulsating or throbbing pain.  Severe pain that prevents daily activities.  Pain that is aggravated by any physical activity.  Nausea, vomiting, or both.  Dizziness.  Pain with exposure to bright lights, loud noises, or activity.  General sensitivity to bright lights, loud noises, or smells. Before you get a migraine, you may get warning signs that a migraine is coming (aura). An aura may include:  Seeing flashing lights.  Seeing bright spots, halos, or zig-zag lines.  Having tunnel vision or blurred vision.  Having feelings of numbness or tingling.  Having trouble talking.  Having muscle weakness. MIGRAINE TRIGGERS  Alcohol.  Smoking.  Stress.  Menstruation.  Aged cheeses.  Foods or drinks that contain nitrates, glutamate, aspartame, or tyramine.  Lack of sleep.  Chocolate.  Caffeine.  Hunger.  Physical exertion.  Fatigue.  Medicines used to treat chest pain (nitroglycerine), birth control pills, estrogen, and some blood pressure medicines. DIAGNOSIS  A migraine headache is often diagnosed based on:  Symptoms.  Physical examination.  A CT scan or MRI of your head. TREATMENT Medicines may be given for pain and nausea. Medicines can also be given to help prevent recurrent migraines.  HOME CARE INSTRUCTIONS  Only take over-the-counter or prescription medicines for pain or discomfort as directed by your caregiver. The use of long-term narcotics is not recommended.  Lie down in a dark, quiet room when you have a migraine.  Keep a journal  to find out what may trigger your migraine headaches. For example, write down:  What you eat and drink.  How much sleep you get.  Any change to your diet or medicines.  Limit alcohol consumption.  Quit smoking if you smoke.  Get 7 to 9 hours of sleep, or as recommended by your caregiver.  Limit stress.  Keep lights dim if bright lights bother you and make your migraines worse. SEEK IMMEDIATE MEDICAL CARE IF:   Your migraine becomes severe.  You have a fever.  You have a stiff neck.  You have vision loss.  You have muscular weakness or loss of muscle control.  You start losing your balance or have trouble walking.  You feel faint or pass out.  You have severe symptoms that are different from your first symptoms. MAKE SURE YOU:   Understand these instructions.  Will watch your condition.  Will get help right away if you are not doing well or get worse. Document Released: 08/30/2005 Document Revised: 11/22/2011 Document Reviewed: 08/20/2011 Exit[employer] Patient Information 2013 [employer].  

## 2012-12-05 NOTE — Progress Notes (Signed)
Here today for new headache consult.  Has had a daily headache x two weeks.  Is also having an ear ache on the left side and some sinus pressure.

## 2012-12-05 NOTE — Progress Notes (Signed)
Diagnosis: Migraine without aura. Pregnancy 17 weeks and 3 days  History: She had her first migraines with her first pregnancy. Dis very well until she became pregnant again. She was quite ill in the first part with nausea and vomiting. She has noticed in the last 2 weeks she has had a migraine every day. When we explored this she has been quite concerned about her son developing MRSA and his treatment. Tylenol has been of no benefit. She has had an episode of anxiety and depression when she was a teen and was put on Lexapro and Zoloft. She had an accidental overdose and has not had any issues with anxiety/ depression since then.   Location: Bilateral Temples  Number of Headache days/month: Daily for last 2 weeks Severe: 14 Moderate:0 Mild:  Current Outpatient Prescriptions on File Prior to Visit  Medication Sig Dispense Refill  . Prenatal Vit-Fe Fumarate-FA (PRENATAL MULTIVITAMIN) TABS Take 1 tablet by mouth daily.  30 tablet  11   No current facility-administered medications on file prior to visit.    Acute prevention: None  Past Medical History  Diagnosis Date  . Allergy   . Dysthymic disorder   . Dysmenorrhea   . Exposure to STD   . MRSA infection   . Tobacco use disorder    Past Surgical History  Procedure Laterality Date  . Pigmented nevus removal  04/1989   Family History  Problem Relation Age of Onset  . Cancer Mother 16    one breast  . Cancer Cousin 24    breast  . Anxiety disorder Other   . Cancer Sister 69    lymph node cancer twice   Social History:  reports that she has quit smoking. She has never used smokeless tobacco. She reports that she uses illicit drugs. She reports that she does not drink alcohol. Allergies: No Known Allergies  Triggers: Hormonal, stress  Birth control: Hormones, stress  Procedures: Trigger point injections 5cc total. See procedure sheet. She was improved with injections  ROS: Positive for anxiety and depression as a teen,  denies any insomnia, asthma, hypertension, allergies. Positive for recent MRSA infection on right shoulder.  Exam: 22 YO caucasian female  General: Well developed, well nourished HEENT:  Bilat TMs clear Cardiac: RRR Lungs: Clear Neuro:Negative Skin:Warm and dry  Impression:migraine - common  Plan: We discussed the pathophysiology of migraine and the risks and benefits of migraine medications in pregnancy. She is given Phenergan and Imitrex for migraine. She was given trigger point injections today and she was relieved of her migraine today. She is asked to RTC if she feels she needs to only.   Time Spent: 45 minutes

## 2012-12-05 NOTE — Telephone Encounter (Signed)
This chart was opened in error/tn

## 2012-12-13 ENCOUNTER — Ambulatory Visit (HOSPITAL_COMMUNITY)
Admission: RE | Admit: 2012-12-13 | Discharge: 2012-12-13 | Disposition: A | Discharge status: Home / Self Care | Payer: 59 | Source: Ambulatory Visit | Attending: Family Medicine | Admitting: Family Medicine

## 2012-12-13 ENCOUNTER — Encounter: Payer: Self-pay | Admitting: Family Medicine

## 2012-12-13 DIAGNOSIS — Z363 Encounter for antenatal screening for malformations: Secondary | ICD-10-CM | POA: Insufficient documentation

## 2012-12-13 DIAGNOSIS — O358XX0 Maternal care for other (suspected) fetal abnormality and damage, not applicable or unspecified: Secondary | ICD-10-CM | POA: Insufficient documentation

## 2012-12-13 DIAGNOSIS — Z3482 Encounter for supervision of other normal pregnancy, second trimester: Secondary | ICD-10-CM

## 2012-12-13 DIAGNOSIS — Z1389 Encounter for screening for other disorder: Secondary | ICD-10-CM | POA: Insufficient documentation

## 2012-12-26 ENCOUNTER — Ambulatory Visit (INDEPENDENT_AMBULATORY_CARE_PROVIDER_SITE_OTHER): Payer: 59 | Admitting: Obstetrics and Gynecology

## 2012-12-26 ENCOUNTER — Encounter: Payer: Self-pay | Admitting: Obstetrics and Gynecology

## 2012-12-26 VITALS — BP 88/60 | Wt 119.0 lb

## 2012-12-26 DIAGNOSIS — B951 Streptococcus, group B, as the cause of diseases classified elsewhere: Secondary | ICD-10-CM

## 2012-12-26 DIAGNOSIS — Z3480 Encounter for supervision of other normal pregnancy, unspecified trimester: Secondary | ICD-10-CM

## 2012-12-26 DIAGNOSIS — Z3482 Encounter for supervision of other normal pregnancy, second trimester: Secondary | ICD-10-CM

## 2012-12-26 DIAGNOSIS — O2390 Unspecified genitourinary tract infection in pregnancy, unspecified trimester: Secondary | ICD-10-CM

## 2012-12-26 DIAGNOSIS — F329 Major depressive disorder, single episode, unspecified: Secondary | ICD-10-CM

## 2012-12-26 DIAGNOSIS — F3289 Other specified depressive episodes: Secondary | ICD-10-CM

## 2012-12-26 NOTE — Progress Notes (Signed)
Patient doing well without complaints. Patient desires f/U ultrasound secondary to poorly visualized spine. Informed of negative MSAFP result. Will schedule follow-up ultrasound.

## 2013-01-03 ENCOUNTER — Ambulatory Visit (HOSPITAL_COMMUNITY)
Admission: RE | Admit: 2013-01-03 | Discharge: 2013-01-03 | Disposition: A | Discharge status: Home / Self Care | Payer: 59 | Source: Ambulatory Visit | Attending: Obstetrics and Gynecology | Admitting: Obstetrics and Gynecology

## 2013-01-03 DIAGNOSIS — Z3482 Encounter for supervision of other normal pregnancy, second trimester: Secondary | ICD-10-CM

## 2013-01-03 DIAGNOSIS — Z3689 Encounter for other specified antenatal screening: Secondary | ICD-10-CM | POA: Insufficient documentation

## 2013-01-15 ENCOUNTER — Ambulatory Visit (INDEPENDENT_AMBULATORY_CARE_PROVIDER_SITE_OTHER): Payer: 59 | Admitting: Obstetrics & Gynecology

## 2013-01-15 ENCOUNTER — Encounter: Payer: Self-pay | Admitting: Obstetrics & Gynecology

## 2013-01-15 VITALS — BP 107/58 | Wt 122.0 lb

## 2013-01-15 DIAGNOSIS — R300 Dysuria: Secondary | ICD-10-CM

## 2013-01-15 DIAGNOSIS — N898 Other specified noninflammatory disorders of vagina: Secondary | ICD-10-CM

## 2013-01-15 DIAGNOSIS — Z3480 Encounter for supervision of other normal pregnancy, unspecified trimester: Secondary | ICD-10-CM

## 2013-01-15 DIAGNOSIS — O26891 Other specified pregnancy related conditions, first trimester: Secondary | ICD-10-CM

## 2013-01-15 DIAGNOSIS — O9989 Other specified diseases and conditions complicating pregnancy, childbirth and the puerperium: Secondary | ICD-10-CM

## 2013-01-15 LAB — POCT URINALYSIS DIPSTICK
Blood, UA: NEGATIVE
Nitrite, UA: NEGATIVE
Protein, UA: NEGATIVE
Urobilinogen, UA: 0.2
pH, UA: 5

## 2013-01-15 MED ORDER — NITROFURANTOIN MONOHYD MACRO 100 MG PO CAPS: 100.0000 mg | ORAL_CAPSULE | Freq: Two times a day (BID) | ORAL | Status: DC

## 2013-01-15 NOTE — Progress Notes (Signed)
Routine visit. Good FM. No OB problems. She thinks that she has a UTI. Since she has trace leukocytes and symptoms, I will treat with macrobid. Her discharge on exam appears normal, but I will send a wet prep. I have encouraged water intake. She wants a PPS and I have reminded her to sign her MCD forms at next visit when she will have her glucola and labs and TDAP.

## 2013-01-15 NOTE — Addendum Note (Signed)
Addended by: Allie Bossier on: 01/15/2013 02:01 PM   Modules accepted: Orders

## 2013-01-15 NOTE — Progress Notes (Signed)
Has a very painful sunburn, also increase in vaginal discharge, has noticed burning with urination.

## 2013-01-15 NOTE — Addendum Note (Signed)
Addended by: Pennie Banter on: 01/15/2013 02:02 PM   Modules accepted: Orders

## 2013-01-16 LAB — WET PREP, GENITAL
Clue Cells Wet Prep HPF POC: NONE SEEN
Trich, Wet Prep: NONE SEEN
Yeast Wet Prep HPF POC: NONE SEEN

## 2013-01-18 ENCOUNTER — Ambulatory Visit (INDEPENDENT_AMBULATORY_CARE_PROVIDER_SITE_OTHER): Payer: 59 | Admitting: Obstetrics & Gynecology

## 2013-01-18 ENCOUNTER — Other Ambulatory Visit: Payer: Self-pay | Admitting: Obstetrics & Gynecology

## 2013-01-18 ENCOUNTER — Encounter: Payer: 59 | Admitting: Obstetrics & Gynecology

## 2013-01-18 VITALS — BP 92/64 | Wt 122.0 lb

## 2013-01-18 DIAGNOSIS — Z3482 Encounter for supervision of other normal pregnancy, second trimester: Secondary | ICD-10-CM

## 2013-01-18 DIAGNOSIS — B373 Candidiasis of vulva and vagina: Secondary | ICD-10-CM

## 2013-01-18 DIAGNOSIS — N899 Noninflammatory disorder of vagina, unspecified: Secondary | ICD-10-CM

## 2013-01-18 DIAGNOSIS — L559 Sunburn, unspecified: Secondary | ICD-10-CM

## 2013-01-18 DIAGNOSIS — N898 Other specified noninflammatory disorders of vagina: Secondary | ICD-10-CM

## 2013-01-18 DIAGNOSIS — Z3480 Encounter for supervision of other normal pregnancy, unspecified trimester: Secondary | ICD-10-CM

## 2013-01-18 DIAGNOSIS — B3731 Acute candidiasis of vulva and vagina: Secondary | ICD-10-CM

## 2013-01-18 NOTE — Progress Notes (Signed)
P - 94 - Pt states she thinks she has yeast infection - also states has painful intercourse - Pt states she get short of breath while talking

## 2013-01-19 NOTE — Patient Instructions (Signed)
Return to clinic for any obstetric concerns or go to MAU for evaluation  

## 2013-01-19 NOTE — Progress Notes (Signed)
Had some sunburn, wanted it evaluated.  Also muscle knot on right shoulder that is tender; not concerning on exam.  Sunburn looks to be healing well,, advised use of sunblock.  No other complaints or concerns.  Routine obstetric precautions reviewed.  Thin, clear vaginal discharge seen and wet prep done; will follow up results and manage accordingly.

## 2013-01-20 LAB — WET PREP, GENITAL
Clue Cells Wet Prep HPF POC: NONE SEEN
Trich, Wet Prep: NONE SEEN

## 2013-01-23 MED ORDER — FLUCONAZOLE 150 MG PO TABS: 150.0000 mg | ORAL_TABLET | Freq: Once | ORAL | Status: DC

## 2013-01-23 NOTE — Addendum Note (Signed)
Addended by: Jaynie Collins A on: 01/23/2013 01:55 PM   Modules accepted: Orders

## 2013-01-30 ENCOUNTER — Ambulatory Visit (INDEPENDENT_AMBULATORY_CARE_PROVIDER_SITE_OTHER): Payer: 59 | Admitting: Obstetrics & Gynecology

## 2013-01-30 VITALS — BP 102/76 | Wt 127.0 lb

## 2013-01-30 DIAGNOSIS — Z3480 Encounter for supervision of other normal pregnancy, unspecified trimester: Secondary | ICD-10-CM

## 2013-01-30 DIAGNOSIS — Z23 Encounter for immunization: Secondary | ICD-10-CM

## 2013-01-30 DIAGNOSIS — Z3482 Encounter for supervision of other normal pregnancy, second trimester: Secondary | ICD-10-CM

## 2013-01-30 MED ORDER — TETANUS-DIPHTH-ACELL PERTUSSIS 5-2.5-18.5 LF-MCG/0.5 IM SUSP: 0.5000 mL | Freq: Once | INTRAMUSCULAR | Status: DC

## 2013-01-30 NOTE — Progress Notes (Signed)
Doing 1HR GTT today. Patient stated that the spine was not seen on two separate ultrasounds, she is concerned if this needs to be repeated.

## 2013-01-30 NOTE — Progress Notes (Signed)
OB followup scan reordered as per patient request. Third trimester labs today, also will get Tdap.  No other complaints or concerns.  Fetal movement and labor precautions reviewed.

## 2013-01-30 NOTE — Patient Instructions (Addendum)
Return to clinic for any obstetric concerns or go to MAU for evaluation  

## 2013-01-31 ENCOUNTER — Encounter: Payer: Self-pay | Admitting: Obstetrics & Gynecology

## 2013-01-31 LAB — OB RESULTS CONSOLE GBS: GBS: POSITIVE

## 2013-01-31 LAB — CBC
MCV: 90.8 fL (ref 78.0–100.0)
Platelets: 227 10*3/uL (ref 150–400)
RBC: 3.81 MIL/uL — ABNORMAL LOW (ref 3.87–5.11)
RDW: 14.5 % (ref 11.5–15.5)
WBC: 7.5 10*3/uL (ref 4.0–10.5)

## 2013-01-31 LAB — HIV ANTIBODY (ROUTINE TESTING W REFLEX): HIV: NONREACTIVE

## 2013-01-31 LAB — GLUCOSE TOLERANCE, 1 HOUR (50G) W/O FASTING: Glucose, 1 Hour GTT: 67 mg/dL — ABNORMAL LOW (ref 70–140)

## 2013-01-31 LAB — RPR

## 2013-02-01 ENCOUNTER — Ambulatory Visit (HOSPITAL_COMMUNITY)
Admission: RE | Admit: 2013-02-01 | Discharge: 2013-02-01 | Disposition: A | Discharge status: Home / Self Care | Payer: 59 | Source: Ambulatory Visit | Attending: Obstetrics & Gynecology | Admitting: Obstetrics & Gynecology

## 2013-02-01 ENCOUNTER — Encounter: Payer: Self-pay | Admitting: Obstetrics & Gynecology

## 2013-02-01 DIAGNOSIS — Z3689 Encounter for other specified antenatal screening: Secondary | ICD-10-CM | POA: Insufficient documentation

## 2013-02-01 DIAGNOSIS — Z3482 Encounter for supervision of other normal pregnancy, second trimester: Secondary | ICD-10-CM

## 2013-02-08 ENCOUNTER — Encounter: Payer: 59 | Admitting: Obstetrics & Gynecology

## 2013-02-16 ENCOUNTER — Encounter: Payer: Self-pay | Admitting: Obstetrics and Gynecology

## 2013-02-16 ENCOUNTER — Ambulatory Visit (INDEPENDENT_AMBULATORY_CARE_PROVIDER_SITE_OTHER): Payer: 59 | Admitting: Obstetrics and Gynecology

## 2013-02-16 VITALS — BP 118/83 | Wt 132.0 lb

## 2013-02-16 DIAGNOSIS — B951 Streptococcus, group B, as the cause of diseases classified elsewhere: Secondary | ICD-10-CM

## 2013-02-16 DIAGNOSIS — O2390 Unspecified genitourinary tract infection in pregnancy, unspecified trimester: Secondary | ICD-10-CM

## 2013-02-16 DIAGNOSIS — Z3483 Encounter for supervision of other normal pregnancy, third trimester: Secondary | ICD-10-CM

## 2013-02-16 DIAGNOSIS — Z3480 Encounter for supervision of other normal pregnancy, unspecified trimester: Secondary | ICD-10-CM

## 2013-02-16 NOTE — Progress Notes (Signed)
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

## 2013-02-16 NOTE — Progress Notes (Signed)
Patient doing well without any complaints. Patient is requesting a note from work limiting her physical activity. Note provided. 1 hr GCT result reviewed with patient

## 2013-02-26 ENCOUNTER — Encounter: Payer: Self-pay | Admitting: Obstetrics and Gynecology

## 2013-02-26 ENCOUNTER — Ambulatory Visit (INDEPENDENT_AMBULATORY_CARE_PROVIDER_SITE_OTHER): Payer: 59 | Admitting: Obstetrics and Gynecology

## 2013-02-26 VITALS — BP 104/67 | Wt 132.0 lb

## 2013-02-26 DIAGNOSIS — O2390 Unspecified genitourinary tract infection in pregnancy, unspecified trimester: Secondary | ICD-10-CM

## 2013-02-26 DIAGNOSIS — Z3483 Encounter for supervision of other normal pregnancy, third trimester: Secondary | ICD-10-CM

## 2013-02-26 DIAGNOSIS — B951 Streptococcus, group B, as the cause of diseases classified elsewhere: Secondary | ICD-10-CM

## 2013-02-26 DIAGNOSIS — Z3480 Encounter for supervision of other normal pregnancy, unspecified trimester: Secondary | ICD-10-CM

## 2013-02-26 MED ORDER — PROGESTERONE MICRONIZED 200 MG PO CAPS: ORAL_CAPSULE | ORAL | Status: DC

## 2013-02-26 NOTE — Progress Notes (Signed)
Patient complaining of increasing pelvic pressure, good fetal movement. cx 1/long/soft. Advised patient to start prometrium and against heavy lifting. Note provided for modified bed rest until reevaluated at next visit. Patient advised to return sooner or go to MAU of symptoms worsen. FM/PTL precautions reviewed

## 2013-02-26 NOTE — Progress Notes (Signed)
P - 90 - Pt states has a lot of vaginal pain and pressure with pain scale 6/10 and has increase in discharge but still thin, sticky

## 2013-03-06 ENCOUNTER — Encounter: Payer: 59 | Admitting: Obstetrics and Gynecology

## 2013-03-06 ENCOUNTER — Inpatient Hospital Stay (HOSPITAL_COMMUNITY)
Admission: AD | Admit: 2013-03-06 | Discharge: 2013-03-06 | Disposition: A | Discharge status: Home / Self Care | Payer: 59 | Source: Ambulatory Visit | Attending: Obstetrics & Gynecology | Admitting: Obstetrics & Gynecology

## 2013-03-06 DIAGNOSIS — IMO0002 Reserved for concepts with insufficient information to code with codable children: Secondary | ICD-10-CM

## 2013-03-06 DIAGNOSIS — R109 Unspecified abdominal pain: Secondary | ICD-10-CM | POA: Insufficient documentation

## 2013-03-06 DIAGNOSIS — Z3483 Encounter for supervision of other normal pregnancy, third trimester: Secondary | ICD-10-CM

## 2013-03-06 DIAGNOSIS — O479 False labor, unspecified: Secondary | ICD-10-CM

## 2013-03-06 DIAGNOSIS — O4700 False labor before 37 completed weeks of gestation, unspecified trimester: Secondary | ICD-10-CM | POA: Insufficient documentation

## 2013-03-06 LAB — URINALYSIS, ROUTINE W REFLEX MICROSCOPIC
Glucose, UA: NEGATIVE mg/dL
Ketones, ur: 15 mg/dL — AB
Protein, ur: NEGATIVE mg/dL
Urobilinogen, UA: 0.2 mg/dL (ref 0.0–1.0)

## 2013-03-06 LAB — URINE MICROSCOPIC-ADD ON

## 2013-03-06 LAB — FETAL FIBRONECTIN: Fetal Fibronectin: NEGATIVE

## 2013-03-06 NOTE — MAU Provider Note (Signed)
Attestation of Attending Supervision of Resident: Evaluation and management procedures were performed by the Family Medicine Resident under my supervision.  I have reviewed the resident's note and chart, and I agree with the management and plan.  Acasia Skilton, MD, FACOG Attending Obstetrician & Gynecologist Faculty Practice, [facility]  

## 2013-03-06 NOTE — MAU Provider Note (Signed)
  History     CSN: [id number]  Arrival date and time: 03/06/13 1118   First Provider Initiated Contact with Patient 03/06/13 1218      Chief Complaint  Patient presents with  . Labor Eval   HPI Taylor Bautista is a 22 y.o. G3P1011 at [redacted]w[redacted]d who presents with contractions.    Patient reports contractions began at 9 am this morning.  Contractions are every 3-5 mins and mild to moderate in severity.  No associated loss of fluid, vaginal bleeding, or discharge.  Patient does reports some associated pelvic pressure.  No other complaints currently.    Past Medical History  Diagnosis Date  . Allergy   . Dysthymic disorder   . Dysmenorrhea   . Exposure to STD   . MRSA infection   . Tobacco use disorder     Past Surgical History  Procedure Laterality Date  . Pigmented nevus removal  04/1989    Family History  Problem Relation Age of Onset  . Cancer Mother 85    one breast  . Cancer Cousin 24    breast  . Anxiety disorder Other   . Cancer Sister 90    lymph node cancer twice    History  Substance Use Topics  . Smoking status: Former Games developer  . Smokeless tobacco: Never Used  . Alcohol Use: No    Allergies: No Known Allergies  Prescriptions prior to admission  Medication Sig Dispense Refill  . Prenatal Vit-Fe Fumarate-FA (PRENATAL MULTIVITAMIN) TABS Take 1 tablet by mouth daily.  30 tablet  11  . progesterone 200 MG SUPP Place 200 mg vaginally at bedtime.        ROS Per HPI.  Physical Exam   Blood pressure 103/64, pulse 81, temperature 97.5 F (36.4 C), temperature source Oral, resp. rate 16, height 5\' 5"  (1.651 m), weight 60.328 kg (133 lb), last menstrual period 07/28/2012, SpO2 100.00%.  Physical Exam Gen: well appearing, NAD.  Heart: RRR. Lungs: CTAB.  Abd: gravid but otherwise soft, nontender to palpation Ext: no appreciable lower extremity edema bilaterally Neuro: grossly nonfocal, speech intact GU: normal appearing external genitalia Pelvic exam: No  bleeding or pooling.  Cervical exam: Dilation: 1 Effacement (%): Thick Exam by:: Dr Adriana Simas  FHR: baseline 130, mod variability, 15x15 accels, no decels Toco: Uterine irritability   MAU Course  Procedures  MDM Pelvic exam, FFN  Assessment and Plan  22 y.o. G3P1011 at [redacted]w[redacted]d who presents with contractions.   - Cervical exam unchanged from prior - FFN negative.  Plan: discharge home with labor precautions.  Taylor Bautista Other 03/06/2013, 12:20 PM

## 2013-03-06 NOTE — MAU Note (Signed)
Patient states she has been having contractions every 3-5 minutes with a lot of mucus. Was 1 1/2 cm 1 1/2 weeks ago and put on vaginal supp. Denies bleeding and reports good fetal movement.

## 2013-03-06 NOTE — MAU Note (Signed)
C/o lower abdominal cramping and pressure since 0900 this AM; is on projesterone suppositories every night;

## 2013-03-15 ENCOUNTER — Ambulatory Visit (INDEPENDENT_AMBULATORY_CARE_PROVIDER_SITE_OTHER): Payer: 59 | Admitting: Obstetrics & Gynecology

## 2013-03-15 VITALS — BP 100/68 | Wt 130.0 lb

## 2013-03-15 DIAGNOSIS — Z3480 Encounter for supervision of other normal pregnancy, unspecified trimester: Secondary | ICD-10-CM

## 2013-03-15 DIAGNOSIS — Z139 Encounter for screening, unspecified: Secondary | ICD-10-CM

## 2013-03-15 LAB — POCT URINALYSIS DIPSTICK
Glucose, UA: NEGATIVE
Spec Grav, UA: 1.025
Urobilinogen, UA: 0.2

## 2013-03-15 NOTE — Progress Notes (Signed)
Pt believes FOB has been unfaithful and would like testing for STDs.  Pt offered blood tests for HIV, Hep B&C adn RPR.  Pt refused blood tests.  GC/Chlam done.  Wet prep sent.  Cervix 1 long (unchanged per pt from exam on Tuesday).

## 2013-03-15 NOTE — Progress Notes (Signed)
P - 101 - Pt states she wants STI testing

## 2013-03-16 ENCOUNTER — Telehealth: Payer: Self-pay | Admitting: Obstetrics & Gynecology

## 2013-03-16 LAB — WET PREP, GENITAL

## 2013-03-16 LAB — GC/CHLAMYDIA PROBE AMP: CT Probe RNA: NEGATIVE

## 2013-03-16 LAB — OB RESULTS CONSOLE GC/CHLAMYDIA
Chlamydia: NEGATIVE
Gonorrhea: NEGATIVE

## 2013-03-16 MED ORDER — FLUCONAZOLE 150 MG PO TABS: 150.0000 mg | ORAL_TABLET | Freq: Once | ORAL | Status: DC

## 2013-03-16 NOTE — Telephone Encounter (Signed)
Yeast on wet prep.  Diflucan called into pharmacy.  Pt aware.

## 2013-03-17 ENCOUNTER — Inpatient Hospital Stay (HOSPITAL_COMMUNITY)
Admission: AD | Admit: 2013-03-17 | Discharge: 2013-03-18 | Disposition: A | Discharge status: Home / Self Care | Payer: 59 | Source: Ambulatory Visit | Attending: Obstetrics & Gynecology | Admitting: Obstetrics & Gynecology

## 2013-03-17 ENCOUNTER — Encounter (HOSPITAL_COMMUNITY): Payer: Self-pay | Admitting: *Deleted

## 2013-03-17 DIAGNOSIS — O4700 False labor before 37 completed weeks of gestation, unspecified trimester: Secondary | ICD-10-CM | POA: Insufficient documentation

## 2013-03-17 DIAGNOSIS — O4690 Antepartum hemorrhage, unspecified, unspecified trimester: Secondary | ICD-10-CM | POA: Insufficient documentation

## 2013-03-17 LAB — URINALYSIS, ROUTINE W REFLEX MICROSCOPIC
Bilirubin Urine: NEGATIVE
Ketones, ur: NEGATIVE mg/dL
Nitrite: NEGATIVE
Protein, ur: NEGATIVE mg/dL
Specific Gravity, Urine: 1.02 (ref 1.005–1.030)
Urobilinogen, UA: 0.2 mg/dL (ref 0.0–1.0)

## 2013-03-17 LAB — CULTURE, URINE COMPREHENSIVE: Colony Count: 4000

## 2013-03-17 LAB — WET PREP, GENITAL: Yeast Wet Prep HPF POC: NONE SEEN

## 2013-03-17 MED ORDER — TERBUTALINE SULFATE 1 MG/ML IJ SOLN
0.2500 mg | INTRAMUSCULAR | Status: AC
  Administered 2013-03-18: 0.25 mg via SUBCUTANEOUS
  Filled 2013-03-17: qty 1

## 2013-03-17 NOTE — MAU Note (Signed)
Pt reports brown/yellow mucus discharge since this morning.  Pt took diflucan today for yeast infection diagnosed 7/4
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
## 2013-03-17 NOTE — Progress Notes (Signed)
States have brown/yellow mucousy d/c

## 2013-03-17 NOTE — MAU Note (Signed)
TOCO only ok per L Leftwich-kirby.

## 2013-03-17 NOTE — MAU Note (Signed)
This am went to [facility] and  yellow and brown mucous on tissue. Contractions throughout the day. My first preg had one episode of PTL but he was born at 88wks

## 2013-03-18 DIAGNOSIS — O4700 False labor before 37 completed weeks of gestation, unspecified trimester: Secondary | ICD-10-CM

## 2013-03-18 NOTE — Progress Notes (Signed)
efm d/ced 

## 2013-03-18 NOTE — Progress Notes (Signed)
Written and verbal d/c instructions given and understanding voiced. 

## 2013-03-26 ENCOUNTER — Encounter: Payer: 59 | Admitting: Family Medicine

## 2013-04-05 ENCOUNTER — Encounter: Payer: Self-pay | Admitting: Obstetrics & Gynecology

## 2013-04-05 ENCOUNTER — Ambulatory Visit (INDEPENDENT_AMBULATORY_CARE_PROVIDER_SITE_OTHER): Payer: 59 | Admitting: Obstetrics & Gynecology

## 2013-04-05 VITALS — BP 113/62 | Wt 133.0 lb

## 2013-04-05 DIAGNOSIS — Z3483 Encounter for supervision of other normal pregnancy, third trimester: Secondary | ICD-10-CM

## 2013-04-05 DIAGNOSIS — Z3480 Encounter for supervision of other normal pregnancy, unspecified trimester: Secondary | ICD-10-CM

## 2013-04-05 NOTE — Progress Notes (Signed)
Last night her son accidentally kicked her stomach getting into bed (24 years old).  This was followed by some cramping and increased mucous discharge but baby is moving well and the cramps subsided.

## 2013-04-05 NOTE — Progress Notes (Signed)
Routine ob visit.  Cultures next visit--not GBS since has hx of GBS in urine.

## 2013-04-12 ENCOUNTER — Inpatient Hospital Stay (HOSPITAL_COMMUNITY)
Admission: AD | Admit: 2013-04-12 | Discharge: 2013-04-15 | DRG: 775 | Disposition: A | Discharge status: Home / Self Care | Payer: 59 | Source: Ambulatory Visit | Attending: Obstetrics & Gynecology | Admitting: Obstetrics & Gynecology

## 2013-04-12 ENCOUNTER — Encounter: Payer: Self-pay | Admitting: Obstetrics and Gynecology

## 2013-04-12 ENCOUNTER — Encounter (HOSPITAL_COMMUNITY): Payer: Self-pay | Admitting: *Deleted

## 2013-04-12 ENCOUNTER — Ambulatory Visit (INDEPENDENT_AMBULATORY_CARE_PROVIDER_SITE_OTHER): Payer: 59 | Admitting: Obstetrics and Gynecology

## 2013-04-12 VITALS — BP 112/64 | Wt 134.8 lb

## 2013-04-12 DIAGNOSIS — O26879 Cervical shortening, unspecified trimester: Secondary | ICD-10-CM | POA: Diagnosis present

## 2013-04-12 DIAGNOSIS — B951 Streptococcus, group B, as the cause of diseases classified elsewhere: Secondary | ICD-10-CM

## 2013-04-12 DIAGNOSIS — O99892 Other specified diseases and conditions complicating childbirth: Secondary | ICD-10-CM | POA: Diagnosis present

## 2013-04-12 DIAGNOSIS — Z3483 Encounter for supervision of other normal pregnancy, third trimester: Secondary | ICD-10-CM

## 2013-04-12 DIAGNOSIS — Z3480 Encounter for supervision of other normal pregnancy, unspecified trimester: Secondary | ICD-10-CM

## 2013-04-12 DIAGNOSIS — O2342 Unspecified infection of urinary tract in pregnancy, second trimester: Secondary | ICD-10-CM

## 2013-04-12 DIAGNOSIS — O4290 Premature rupture of membranes, unspecified as to length of time between rupture and onset of labor, unspecified weeks of gestation: Principal | ICD-10-CM | POA: Diagnosis present

## 2013-04-12 DIAGNOSIS — O2390 Unspecified genitourinary tract infection in pregnancy, unspecified trimester: Secondary | ICD-10-CM

## 2013-04-12 DIAGNOSIS — Z22330 Carrier of Group B streptococcus: Secondary | ICD-10-CM

## 2013-04-12 MED ORDER — LACTATED RINGERS IV SOLN
INTRAVENOUS | Status: DC
  Administered 2013-04-12: 125 mL/h via INTRAVENOUS

## 2013-04-12 MED ORDER — TERBUTALINE SULFATE 1 MG/ML IJ SOLN: 0.2500 mg | Freq: Once | INTRAMUSCULAR | Status: AC | PRN

## 2013-04-12 MED ORDER — LACTATED RINGERS IV SOLN: 500.0000 mL | INTRAVENOUS | Status: DC | PRN

## 2013-04-12 MED ORDER — FLEET ENEMA 7-19 GM/118ML RE ENEM: 1.0000 | ENEMA | RECTAL | Status: DC | PRN

## 2013-04-12 MED ORDER — ZOLPIDEM TARTRATE 5 MG PO TABS: 5.0000 mg | ORAL_TABLET | Freq: Every evening | ORAL | Status: DC | PRN

## 2013-04-12 MED ORDER — PENICILLIN G POTASSIUM 5000000 UNITS IJ SOLR
5.0000 10*6.[IU] | Freq: Once | INTRAVENOUS | Status: AC
  Administered 2013-04-12: 5 10*6.[IU] via INTRAVENOUS
  Filled 2013-04-12: qty 5

## 2013-04-12 MED ORDER — LIDOCAINE HCL (PF) 1 % IJ SOLN
30.0000 mL | INTRAMUSCULAR | Status: DC | PRN
  Filled 2013-04-12 (×2): qty 30

## 2013-04-12 MED ORDER — OXYTOCIN BOLUS FROM INFUSION: 500.0000 mL | INTRAVENOUS | Status: DC

## 2013-04-12 MED ORDER — OXYCODONE-ACETAMINOPHEN 5-325 MG PO TABS: 1.0000 | ORAL_TABLET | ORAL | Status: DC | PRN

## 2013-04-12 MED ORDER — FENTANYL CITRATE 0.05 MG/ML IJ SOLN: 50.0000 ug | INTRAMUSCULAR | Status: DC | PRN

## 2013-04-12 MED ORDER — PENICILLIN G POTASSIUM 5000000 UNITS IJ SOLR
2.5000 10*6.[IU] | INTRAVENOUS | Status: DC
  Administered 2013-04-13 (×2): 2.5 10*6.[IU] via INTRAVENOUS
  Filled 2013-04-12 (×4): qty 2.5

## 2013-04-12 MED ORDER — IBUPROFEN 600 MG PO TABS: 600.0000 mg | ORAL_TABLET | Freq: Four times a day (QID) | ORAL | Status: DC | PRN

## 2013-04-12 MED ORDER — ACETAMINOPHEN 325 MG PO TABS: 650.0000 mg | ORAL_TABLET | ORAL | Status: DC | PRN

## 2013-04-12 MED ORDER — OXYTOCIN 40 UNITS IN LACTATED RINGERS INFUSION - SIMPLE MED
62.5000 mL/h | INTRAVENOUS | Status: DC
  Administered 2013-04-13: 62.5 mL/h via INTRAVENOUS
  Filled 2013-04-12: qty 1000

## 2013-04-12 MED ORDER — ONDANSETRON HCL 4 MG/2ML IJ SOLN
4.0000 mg | Freq: Four times a day (QID) | INTRAMUSCULAR | Status: DC | PRN
  Administered 2013-04-13: 4 mg via INTRAVENOUS
  Filled 2013-04-12: qty 2

## 2013-04-12 MED ORDER — OXYTOCIN 40 UNITS IN LACTATED RINGERS INFUSION - SIMPLE MED
1.0000 m[IU]/min | INTRAVENOUS | Status: DC
  Administered 2013-04-13: 4 m[IU]/min via INTRAVENOUS
  Administered 2013-04-13 (×2): 6 m[IU]/min via INTRAVENOUS
  Administered 2013-04-13: 2 m[IU]/min via INTRAVENOUS

## 2013-04-12 MED ORDER — CITRIC ACID-SODIUM CITRATE 334-500 MG/5ML PO SOLN: 30.0000 mL | ORAL | Status: DC | PRN

## 2013-04-12 NOTE — MAU Note (Signed)
Pt presents with ROM at 2215

## 2013-04-12 NOTE — H&P (Signed)
Taylor Bautista is a 22 y.o. female G10P1011 with IUP at [redacted]w[redacted]d presenting for PROM at 2200. Pt states she has been having irregular, every 3-5 minutes contractions, associated with none vaginal bleeding.  Membranes are ruptured, clear fluid, with active fetal movement.   [facility] 10 wks  Prenatal History/Complications: Preterm cervical shortening, took progesterone GBS bacteruria Past Medical History: Past Medical History  Diagnosis Date  . Allergy   . Dysthymic disorder   . Dysmenorrhea   . Exposure to STD   . MRSA infection   . Tobacco use disorder     Past Surgical History: Past Surgical History  Procedure Laterality Date  . Pigmented nevus removal  04/1989    Obstetrical History: OB History   Grav Para Term Preterm Abortions TAB SAB Ect Mult Living   3 1 1  0 1 1 0 0 0 1     Social History: History   Social History  . Marital Status: Single    Spouse Name: N/A    Number of Children: N/A  . Years of Education: N/A   Social History Main Topics  . Smoking status: Former Games developer  . Smokeless tobacco: Never Used  . Alcohol Use: No     Comment: NONE WHILE PREG  . Drug Use: Yes     Comment: MJ occasionally; 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  . Sexually Active: Yes    Birth Control/ Protection: None   Other Topics Concern  . None   Social History Narrative   Lives with mother      Works at [employer]          Family History: Family History  Problem Relation Age of Onset  . Cancer Mother 98    one breast  . Cancer Cousin 24    breast  . Anxiety disorder Other   . Cancer Sister 81    lymph node cancer twice    Allergies: No Known Allergies  Prescriptions prior to admission  Medication Sig Dispense Refill  . Prenatal Vit-Fe Fumarate-FA (PRENATAL MULTIVITAMIN) TABS Take 1 tablet by mouth daily.  30 tablet  11  . progesterone 200 MG SUPP Place 200 mg vaginally at bedtime.         Review of Systems    Constitutional: Negative for fever, chills, weight loss, malaise/fatigue and diaphoresis.  HENT: Negative for hearing loss, ear pain, nosebleeds, congestion, sore throat, neck pain, tinnitus and ear discharge.   Eyes: Negative for blurred vision, double vision, photophobia, pain, discharge and redness.  Respiratory: Negative for cough, hemoptysis, sputum production, shortness of breath, wheezing and stridor.   Cardiovascular: Negative for chest pain, palpitations, orthopnea,  leg swelling  Gastrointestinal: Positive for abdominal pain (Contractions started right after water broke-getting stronger). Negative for heartburn, nausea, vomiting, diarrhea, constipation, blood in stool Genitourinary: Negative for dysuria, urgency, frequency, hematuria and flank pain.  Musculoskeletal: Negative for myalgias, back pain, joint pain and falls.  Skin: Negative for itching and rash.  Neurological: Negative for dizziness, tingling, tremors, sensory change, speech change, focal weakness, seizures, loss of consciousness, weakness and headaches.  Endo/Heme/Allergies: Negative for environmental allergies and polydipsia. Does not bruise/bleed easily.  Psychiatric/Behavioral: Negative for depression, suicidal ideas, hallucinations, memory loss and substance abuse. The patient is not nervous/anxious and does not have insomnia.       Temperature 99 F (37.2 C), temperature source Oral, resp. rate 20, height 5\' 6"  (1.676 m), weight 135 lb (61.236 kg), last menstrual period  07/28/2012. General appearance: alert, cooperative and no distress Lungs: clear to auscultation bilaterally Heart: regular rate and rhythm Abdomen: soft, non-tender; bowel sounds normal Pelvic: leaking clear amniotic fluid Extremities: Homans sign is negative, no sign of DVT DTR's 2+ Presentation: cephalic Fetal monitoringBaseline: 150 bpm, Variability: Good {> 6 bpm), Accelerations: Reactive and Decelerations: Absent Uterine activity   Mild, q 3-5 minutes  Dilation: 2 Effacement (%): 70 Station: -3   Prenatal labs: ABO, Rh: O/POS/-- (02/03 1135) Antibody: NEG (02/03 1135) Rubella:  immune RPR: NON REAC (05/20 1024)  HBsAg: NEGATIVE (02/03 1135)  HIV: NON REACTIVE (05/20 1024)  GBS:   + in urine 5/14 1 hr Glucola 67 Genetic screening  normal Anatomy US normal    Assessment: Taylor Bautista is a 22 y.o. G3P1011 with an IUP at [redacted]w[redacted]d presenting for PROM, early labor  Plan: Treat GBS Expectant management; will augment in a few hours if contractions don't increase in strength on their own   CRESENZO-DISHMAN,Rikia Sukhu 04/12/2013, 11:22 PM

## 2013-04-12 NOTE — Progress Notes (Signed)
Patient doing well without complaints. FM/labor precautions reviewed. Gc/Chl collected

## 2013-04-12 NOTE — MAU Note (Signed)
PT SAYS   SHE SROM AT 1015PM-  WITH CLEAR FLUID.Marland Kitchen   GETS PNC AT [facility]-    TODAY VE  WAS  2 CM.    DENIES HSV .   SAYS HAS HX OF MRSA-  2009-     HAD SORE ON R SHOULDER.   - CHART NOT FLAGGED.

## 2013-04-13 ENCOUNTER — Inpatient Hospital Stay (HOSPITAL_COMMUNITY): Payer: 59 | Admitting: Anesthesiology

## 2013-04-13 ENCOUNTER — Encounter (HOSPITAL_COMMUNITY): Payer: Self-pay | Admitting: Anesthesiology

## 2013-04-13 ENCOUNTER — Encounter (HOSPITAL_COMMUNITY): Payer: Self-pay | Admitting: *Deleted

## 2013-04-13 DIAGNOSIS — O26879 Cervical shortening, unspecified trimester: Secondary | ICD-10-CM

## 2013-04-13 DIAGNOSIS — O4290 Premature rupture of membranes, unspecified as to length of time between rupture and onset of labor, unspecified weeks of gestation: Secondary | ICD-10-CM

## 2013-04-13 LAB — CBC
MCH: 31.7 pg (ref 26.0–34.0)
MCHC: 35.5 g/dL (ref 30.0–36.0)
Platelets: 198 10*3/uL (ref 150–400)
RDW: 13 % (ref 11.5–15.5)

## 2013-04-13 LAB — RPR: RPR Ser Ql: NONREACTIVE

## 2013-04-13 LAB — GC/CHLAMYDIA PROBE AMP
CT Probe RNA: NEGATIVE
GC Probe RNA: NEGATIVE

## 2013-04-13 MED ORDER — OXYCODONE-ACETAMINOPHEN 5-325 MG PO TABS: 1.0000 | ORAL_TABLET | ORAL | Status: DC | PRN

## 2013-04-13 MED ORDER — ZOLPIDEM TARTRATE 5 MG PO TABS: 5.0000 mg | ORAL_TABLET | Freq: Every evening | ORAL | Status: DC | PRN

## 2013-04-13 MED ORDER — ONDANSETRON HCL 4 MG PO TABS: 4.0000 mg | ORAL_TABLET | ORAL | Status: DC | PRN

## 2013-04-13 MED ORDER — FENTANYL 2.5 MCG/ML BUPIVACAINE 1/10 % EPIDURAL INFUSION (WH - ANES)
14.0000 mL/h | INTRAMUSCULAR | Status: DC | PRN
  Administered 2013-04-13: 14 mL/h via EPIDURAL
  Filled 2013-04-13: qty 125

## 2013-04-13 MED ORDER — LIDOCAINE HCL (PF) 1 % IJ SOLN
INTRAMUSCULAR | Status: DC | PRN
  Administered 2013-04-13 (×2): 5 mL

## 2013-04-13 MED ORDER — ONDANSETRON HCL 4 MG/2ML IJ SOLN: 4.0000 mg | INTRAMUSCULAR | Status: DC | PRN

## 2013-04-13 MED ORDER — EPHEDRINE 5 MG/ML INJ
10.0000 mg | INTRAVENOUS | Status: DC | PRN
  Filled 2013-04-13: qty 2
  Filled 2013-04-13: qty 4

## 2013-04-13 MED ORDER — DIPHENHYDRAMINE HCL 25 MG PO CAPS: 25.0000 mg | ORAL_CAPSULE | Freq: Four times a day (QID) | ORAL | Status: DC | PRN

## 2013-04-13 MED ORDER — IBUPROFEN 600 MG PO TABS
600.0000 mg | ORAL_TABLET | Freq: Four times a day (QID) | ORAL | Status: DC
  Administered 2013-04-13 – 2013-04-15 (×8): 600 mg via ORAL
  Filled 2013-04-13 (×8): qty 1

## 2013-04-13 MED ORDER — PRENATAL MULTIVITAMIN CH
1.0000 | ORAL_TABLET | Freq: Every day | ORAL | Status: DC
  Administered 2013-04-13 – 2013-04-15 (×3): 1 via ORAL
  Filled 2013-04-13 (×3): qty 1

## 2013-04-13 MED ORDER — SIMETHICONE 80 MG PO CHEW: 80.0000 mg | CHEWABLE_TABLET | ORAL | Status: DC | PRN

## 2013-04-13 MED ORDER — SENNOSIDES-DOCUSATE SODIUM 8.6-50 MG PO TABS
2.0000 | ORAL_TABLET | Freq: Every day | ORAL | Status: DC
  Administered 2013-04-13 – 2013-04-14 (×2): 2 via ORAL

## 2013-04-13 MED ORDER — TETANUS-DIPHTH-ACELL PERTUSSIS 5-2.5-18.5 LF-MCG/0.5 IM SUSP: 0.5000 mL | Freq: Once | INTRAMUSCULAR | Status: DC

## 2013-04-13 MED ORDER — BENZOCAINE-MENTHOL 20-0.5 % EX AERO
1.0000 "application " | INHALATION_SPRAY | CUTANEOUS | Status: DC | PRN
  Administered 2013-04-13: 1 via TOPICAL
  Filled 2013-04-13: qty 56

## 2013-04-13 MED ORDER — LANOLIN HYDROUS EX OINT: TOPICAL_OINTMENT | CUTANEOUS | Status: DC | PRN

## 2013-04-13 MED ORDER — EPHEDRINE 5 MG/ML INJ
10.0000 mg | INTRAVENOUS | Status: DC | PRN
  Filled 2013-04-13: qty 2

## 2013-04-13 MED ORDER — PHENYLEPHRINE 40 MCG/ML (10ML) SYRINGE FOR IV PUSH (FOR BLOOD PRESSURE SUPPORT)
80.0000 ug | PREFILLED_SYRINGE | INTRAVENOUS | Status: DC | PRN
  Filled 2013-04-13: qty 5
  Filled 2013-04-13: qty 2

## 2013-04-13 MED ORDER — DIBUCAINE 1 % RE OINT: 1.0000 "application " | TOPICAL_OINTMENT | RECTAL | Status: DC | PRN

## 2013-04-13 MED ORDER — DIPHENHYDRAMINE HCL 50 MG/ML IJ SOLN: 12.5000 mg | INTRAMUSCULAR | Status: DC | PRN

## 2013-04-13 MED ORDER — LACTATED RINGERS IV SOLN
500.0000 mL | Freq: Once | INTRAVENOUS | Status: AC
  Administered 2013-04-13: 500 mL via INTRAVENOUS

## 2013-04-13 MED ORDER — PHENYLEPHRINE 40 MCG/ML (10ML) SYRINGE FOR IV PUSH (FOR BLOOD PRESSURE SUPPORT)
80.0000 ug | PREFILLED_SYRINGE | INTRAVENOUS | Status: DC | PRN
  Filled 2013-04-13: qty 2

## 2013-04-13 MED ORDER — WITCH HAZEL-GLYCERIN EX PADS: 1.0000 "application " | MEDICATED_PAD | CUTANEOUS | Status: DC | PRN

## 2013-04-13 NOTE — Anesthesia Postprocedure Evaluation (Signed)
Anesthesia Post Note  Patient: Taylor Bautista  Procedure(s) Performed: * No procedures listed *  Anesthesia type: Epidural  Patient location: Mother/Baby  Post pain: Pain level controlled  Post assessment: Post-op Vital signs reviewed  Last Vitals:  Filed Vitals:   04/13/13 1100  BP: 105/60  Pulse: 66  Temp: 37.1 C  Resp: 18    Post vital signs: Reviewed  Level of consciousness:alert  Complications: No apparent anesthesia complications

## 2013-04-13 NOTE — Progress Notes (Signed)
   Taylor Bautista is a 21 y.o. G3P1011 at [redacted]w[redacted]d  admitted for Preterm labor, PROM  Subjective: Comfortable with epidural  Objective: BP 125/75  Pulse 75  Temp(Src) 98 F (36.7 C) (Oral)  Resp 18  Ht 5\' 6"  (1.676 m)  Wt 135 lb (61.236 kg)  BMI 21.8 kg/m2  SpO2 100%  LMP 07/28/2012    FHT:  FHR: 135 bpm, variability: moderate,  accelerations:  Present,  decelerations:  Absent UC:   Had been regular before epidural, have since spaced out SVE:   Dilation: 2 Effacement (%): 70 Station: -3   Labs: Lab Results  Component Value Date   WBC 6.7 04/12/2013   HGB 11.4* 04/12/2013   HCT 32.1* 04/12/2013   MCV 89.2 04/12/2013   PLT 198 04/12/2013    Assessment / Plan: PROM, early labor Will augment with pitocin Labor: Progressing normally Fetal Wellbeing:  Category I Pain Control:  Epidural Anticipated MOD:  NSVD  CRESENZO-DISHMAN,Darinda [place] 04/13/2013, 3:11 AM

## 2013-04-13 NOTE — Anesthesia Preprocedure Evaluation (Signed)

## 2013-04-13 NOTE — Progress Notes (Signed)
   Taylor Bautista is a 22 y.o. G3P1011 at [redacted]w[redacted]d  admitted for active labor, rupture of membranes  Subjective: Comfortable with epidural  Objective: BP 121/70  Pulse 71  Temp(Src) 98 F (36.7 C) (Oral)  Resp 18  Ht 5\' 6"  (1.676 m)  Wt 135 lb (61.236 kg)  BMI 21.8 kg/m2  SpO2 86%  LMP 07/28/2012    FHT:  FHR: 130 bpm, variability: moderate,  accelerations:  Present,  decelerations:  Absent UC:   regular, every 3 minutes SVE:   Dilation: 8 Effacement (%): 100 Station: +1 Exam by:: fcresenzo-dishmon,cnm Pitocin @ 6 mu/min  Labs: Lab Results  Component Value Date   WBC 6.7 04/12/2013   HGB 11.4* 04/12/2013   HCT 32.1* 04/12/2013   MCV 89.2 04/12/2013   PLT 198 04/12/2013    Assessment / Plan: Augmentation of labor, progressing well  Labor: Progressing normally Fetal Wellbeing:  Category I Pain Control:  Epidural Anticipated MOD:  NSVD  CRESENZO-DISHMAN,Taylor Bautista 04/13/2013, 7:25 AM

## 2013-04-13 NOTE — Anesthesia Procedure Notes (Signed)
Epidural Patient location during procedure: OB Start time: 04/13/2013 1:28 AM  Staffing Anesthesiologist: Angus Seller., Harrell Gave. Performed by: anesthesiologist   Preanesthetic Checklist Completed: patient identified, site marked, surgical consent, pre-op evaluation, timeout performed, IV checked, risks and benefits discussed and monitors and equipment checked  Epidural Patient position: sitting Prep: site prepped and draped and DuraPrep Patient monitoring: continuous pulse ox and blood pressure Approach: midline Injection technique: LOR air and LOR saline  Needle:  Needle type: Tuohy  Needle gauge: 17 G Needle length: 9 cm and 9 Needle insertion depth: 5 cm cm Catheter type: closed end flexible Catheter size: 19 Gauge Catheter at skin depth: 10 cm Test dose: negative  Assessment Events: blood not aspirated, injection not painful, no injection resistance, negative IV test and no paresthesia  Additional Notes Patient identified.  Risk benefits discussed including failed block, incomplete pain control, headache, nerve damage, paralysis, blood pressure changes, nausea, vomiting, reactions to medication both toxic or allergic, and postpartum back pain.  Patient expressed understanding and wished to proceed.  All questions were answered.  Sterile technique used throughout procedure and epidural site dressed with sterile barrier dressing. No paresthesia or other complications noted.The patient did not experience any signs of intravascular injection such as tinnitus or metallic taste in mouth nor signs of intrathecal spread such as rapid motor block. Please see nursing notes for vital signs.

## 2013-04-14 NOTE — Lactation Note (Signed)
This note was copied from the chart of Taylor Bautista. Lactation Consultation Note: Mom reports that her nipples are too sore to nurse so she has been pumping and also giving formula when she did not obtain any milk by pumping. Last pumping obtained about 5 cc's. No questions at present. Several visitors present. To call prn  Patient Name: Taylor Bautista ZOXWR'U Date: 04/14/2013 Reason for consult: Follow-up assessment   Maternal Data Formula Feeding for Exclusion: Yes Reason for exclusion: Mother's choice to formula and breast feed on admission  Feeding    LATCH Score/Interventions             Problem noted: Mild/Moderate discomfort Interventions  (Cracked/bleeding/bruising/blister): Double electric pump        Lactation Tools Discussed/Used     Consult Status Consult Status: PRN    Pamelia Hoit 04/14/2013, 1:25 PM
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
## 2013-04-14 NOTE — Progress Notes (Signed)
Post Partum Day 1 Subjective: no complaints, up ad lib, voiding, tolerating PO and + flatus  Objective: Blood pressure 103/52, pulse 58, temperature 98 F (36.7 C), temperature source Oral, resp. rate 18, height 5\' 6"  (1.676 m), weight 61.236 kg (135 lb), last menstrual period 07/28/2012, SpO2 98.00%, unknown if currently breastfeeding.  Physical Exam:  General: alert, cooperative and no distress Lochia: appropriate Uterine Fundus: firm Incision: na DVT Evaluation: No evidence of DVT seen on physical exam.   Recent Labs  04/12/13 2350  HGB 11.4*  HCT 32.1*    Assessment/Plan: Plan for discharge tomorrow, Breastfeeding, Lactation consult and Contraception nexplanon. Staying due to preterm birth.    LOS: 2 days   Callie Bunyard L 04/14/2013, 10:11 AM

## 2013-04-14 NOTE — Progress Notes (Signed)
CSW also noted hx of MJ use and will address this hx with MOB as well.  [phone]

## 2013-04-15 MED ORDER — IBUPROFEN 600 MG PO TABS: 600.0000 mg | ORAL_TABLET | Freq: Four times a day (QID) | ORAL | Status: DC

## 2013-04-15 MED ORDER — OXYCODONE-ACETAMINOPHEN 5-325 MG PO TABS: 1.0000 | ORAL_TABLET | ORAL | Status: DC | PRN

## 2013-04-15 NOTE — H&P (Signed)
Attestation of Attending Supervision of Advanced Practitioner (PA/CNM/NP): Evaluation and management procedures were performed by the Advanced Practitioner under my supervision and collaboration.  I have reviewed the Advanced Practitioner's note and chart, and I agree with the management and plan.  Leesa Leifheit, MD, FACOG Attending Obstetrician & Gynecologist Faculty Practice, [facility]  

## 2013-04-15 NOTE — Lactation Note (Signed)
This note was copied from the chart of Taylor Bautista. Lactation Consultation Note  Patient Name: Taylor Bautista ZOXWR'U Date: 04/15/2013 Reason for consult: Follow-up assessment   Maternal Data    Feeding    LATCH Score/Interventions          Comfort (Breast/Nipple): Filling, red/small blisters or bruises, mild/mod discomfort  Problem noted: Filling;Mild/Moderate discomfort Interventions  (Cracked/bleeding/bruising/blister): Double electric pump Interventions (Mild/moderate discomfort): Hand massage;Hand expression;Comfort gels        Lactation Tools Discussed/Used     Consult Status Consult Status: Complete  Experienced BF mom reports that her breasts are very full- her milk has come in this morning. Has not pumped or nursed since about 9 pm. Has not been putting the baby to the breast because her nipples are too sore. Baby had formula about 30 minutes ago. Getting ready to pump when I went into room. Mom states she has Medela pump at home. Discussed engorgement prevention and treatment. Offered assist with latch if mom desires. To call prn.  Pamelia Hoit 04/15/2013, 8:11 AM

## 2013-04-15 NOTE — Discharge Summary (Signed)
Obstetric Discharge Summary Reason for Admission: onset of labor Prenatal Procedures: ultrasound Intrapartum Procedures: spontaneous vaginal delivery Postpartum Procedures: none Complications-Operative and Postpartum: none Hemoglobin  Date Value Range Status  04/12/2013 11.4* 12.0 - 15.0 g/dL Final     HCT  Date Value Range Status  04/12/2013 32.1* 36.0 - 46.0 % Final    Physical Exam:  General: alert, cooperative, appears stated age and no distress Lochia: appropriate Uterine Fundus: firm Incision: n/a DVT Evaluation: No evidence of DVT seen on physical exam. Negative Homan's sign.  Discharge Diagnoses: Term Pregnancy-delivered  Discharge Information: Date: 04/15/2013 Activity: pelvic rest Diet: routine Medications: PNV, Ibuprofen and Percocet Condition: stable and improved Instructions: refer to practice specific booklet Discharge to: home   Newborn Data: Live born female  Birth Weight: 6 lb 7 oz (2920 g) APGAR: 9, 9  Home with mother.  Taylor Bautista 04/15/2013, 9:42 AM
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
## 2013-04-19 ENCOUNTER — Encounter: Payer: 59 | Admitting: Obstetrics and Gynecology

## 2013-04-23 ENCOUNTER — Ambulatory Visit (HOSPITAL_COMMUNITY): Admission: RE | Admit: 2013-04-23 | Payer: 59 | Source: Ambulatory Visit

## 2013-05-29 ENCOUNTER — Ambulatory Visit (INDEPENDENT_AMBULATORY_CARE_PROVIDER_SITE_OTHER): Payer: 59 | Admitting: Obstetrics & Gynecology

## 2013-05-29 ENCOUNTER — Encounter: Payer: Self-pay | Admitting: Obstetrics & Gynecology

## 2013-05-29 DIAGNOSIS — Z3049 Encounter for surveillance of other contraceptives: Secondary | ICD-10-CM

## 2013-05-29 DIAGNOSIS — Z23 Encounter for immunization: Secondary | ICD-10-CM

## 2013-05-29 DIAGNOSIS — Z30013 Encounter for initial prescription of injectable contraceptive: Secondary | ICD-10-CM

## 2013-05-29 DIAGNOSIS — Z01812 Encounter for preprocedural laboratory examination: Secondary | ICD-10-CM

## 2013-05-29 MED ORDER — MEDROXYPROGESTERONE ACETATE 150 MG/ML IM SUSP
150.0000 mg | INTRAMUSCULAR | Status: DC
  Administered 2013-05-29 – 2014-03-12 (×3): 150 mg via INTRAMUSCULAR

## 2013-05-29 NOTE — Progress Notes (Signed)
  Subjective:     Taylor Bautista is a 22 y.o. [phone] female who presents for a postpartum visit. She is 6 weeks postpartum following a spontaneous vaginal delivery. I have fully reviewed the prenatal and intrapartum course. The delivery was at 36 gestational weeks.  Anesthesia: epidural. Postpartum course and baby's course have been uncomplicated.  Baby is feeding by bottle. Bleeding no bleeding. Bowel function is normal. Bladder function is normal. Patient is sexually active, had about two weeks ago. Contraception method is Depo-Provera injections, will receive today. Postpartum depression screening: negative.  The following portions of the patient's history were reviewed and updated as appropriate: allergies, current medications, past family history, past medical history, past social history, past surgical history and problem list.  Review of Systems Pertinent items are noted in HPI.   Objective:    BP 104/69  Pulse 76  Ht 5\' 6"  (1.676 m)  Wt 121 lb (54.885 kg)  BMI 19.54 kg/m2  Breastfeeding? No  General:  alert and no distress  Abdomen: soft, non-tender; bowel sounds normal; no masses,  no organomegaly   Vulva:  normal  Vagina: normal vagina  Cervix:  normal  Corpus: normal size, contour, position, consistency, mobility, non-tender  Adnexa:  normal adnexa and no mass, fullness, tenderness  Rectal Exam: Not performed.     POCT UPT : Negative  Assessment:     Normal postpartum exam. Pap smear not done at today's visit.   Plan:   1. Contraception: Depo-Provera injections, received today, return in 3 months 2. Flu vaccine given today 3. Follow up as needed.

## 2013-05-29 NOTE — Patient Instructions (Signed)
Return to clinic for any scheduled appointments or for any gynecologic concerns as needed.   

## 2013-07-03 ENCOUNTER — Encounter: Payer: Self-pay | Admitting: Family Medicine

## 2013-07-03 ENCOUNTER — Ambulatory Visit (INDEPENDENT_AMBULATORY_CARE_PROVIDER_SITE_OTHER): Payer: 59 | Admitting: Family Medicine

## 2013-07-03 VITALS — BP 119/75 | HR 73 | Ht 66.0 in | Wt 123.0 lb

## 2013-07-03 DIAGNOSIS — F341 Dysthymic disorder: Secondary | ICD-10-CM

## 2013-07-03 MED ORDER — VENLAFAXINE HCL ER 75 MG PO CP24: 75.0000 mg | ORAL_CAPSULE | Freq: Every day | ORAL | Status: DC

## 2013-07-03 NOTE — Assessment & Plan Note (Addendum)
Attempted to write for Effexor, however, after pt. Learned of medication, said she was not anxious or depressed, had been on Zoloft in the past and it had not worked for her.  She just desired something to help her calm down when she was angry.  Advised that she should seek psychiatry for this.  Pt. Was angry on leaving the office and was loud in the lobby with angry outburst but psychiatry referral was made.
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
## 2013-07-03 NOTE — Patient Instructions (Signed)

## 2013-07-20 ENCOUNTER — Encounter: Payer: Self-pay | Admitting: Internal Medicine

## 2013-07-20 ENCOUNTER — Ambulatory Visit (INDEPENDENT_AMBULATORY_CARE_PROVIDER_SITE_OTHER): Payer: 59 | Admitting: Internal Medicine

## 2013-07-20 VITALS — BP 102/58 | HR 103 | Temp 98.1°F | Wt 124.0 lb

## 2013-07-20 DIAGNOSIS — Z0000 Encounter for general adult medical examination without abnormal findings: Secondary | ICD-10-CM

## 2013-07-20 DIAGNOSIS — F419 Anxiety disorder, unspecified: Secondary | ICD-10-CM | POA: Insufficient documentation

## 2013-07-20 DIAGNOSIS — F411 Generalized anxiety disorder: Secondary | ICD-10-CM

## 2013-07-20 MED ORDER — ALPRAZOLAM 0.5 MG PO TABS: 0.5000 mg | ORAL_TABLET | Freq: Two times a day (BID) | ORAL | Status: DC | PRN

## 2013-07-20 NOTE — Progress Notes (Signed)
HPI  Pt presents to the clinic today to establish care. She is transferring care from. She does have some concerns today anger impatience, especially when dealing with her children. She discussed this with her GYN who put her on an antidepressant. She is not taking it and reports she is not depressed.  Flu: 11/2012 Tetanus: 2014 LMP: 06/2013 Pap Smear: 2014 Dentist: yearly  Past Medical History  Diagnosis Date  . Allergy   . Dysthymic disorder   . Dysmenorrhea   . Exposure to STD   . MRSA infection   . Tobacco use disorder     Current Outpatient Prescriptions  Medication Sig Dispense Refill  . Prenatal Vit-Fe Fumarate-FA (PRENATAL MULTIVITAMIN) TABS Take 1 tablet by mouth daily.  30 tablet  11   Current Facility-Administered Medications  Medication Dose Route Frequency Provider Last Rate Last Dose  . medroxyPROGESTERone (DEPO-PROVERA) injection 150 mg  150 mg Intramuscular Q90 days Tereso Newcomer, MD   150 mg at 05/29/13 1101    No Known Allergies  Family History  Problem Relation Age of Onset  . Cancer Mother 82    one breast  . Cancer Cousin 24    breast  . Anxiety disorder Other   . Cancer Sister 16    lymph node cancer twice    History   Social History  . Marital Status: Single    Spouse Name: N/A    Number of Children: N/A  . Years of Education: N/A   Occupational History  . Not on file.   Social History Main Topics  . Smoking status: Former Smoker    Quit date: 04/13/2012  . Smokeless tobacco: Never Used  . Alcohol Use: Yes     Comment: social drinking  . 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 Sexual Activity: Yes    Birth Control/ Protection: Injection   Other Topics Concern  . Not on file   Social History Narrative   Lives with mother      Works at [employer]          ROS:  Constitutional: Denies fever, malaise, fatigue, headache or abrupt weight  changes.  HEENT: Denies eye pain, eye redness, ear pain, ringing in the ears, wax buildup, runny nose, nasal congestion, bloody nose, or sore throat. Respiratory: Denies difficulty breathing, shortness of breath, cough or sputum production.   Cardiovascular: Denies chest pain, chest tightness, palpitations or swelling in the hands or feet.  Gastrointestinal: Denies abdominal pain, bloating, constipation, diarrhea or blood in the stool.  GU: Denies frequency, urgency, pain with urination, blood in urine, odor or discharge. Musculoskeletal: Denies decrease in range of motion, difficulty with gait, muscle pain or joint pain and swelling.  Skin: Denies redness, rashes, lesions or ulcercations.  Neurological: Denies dizziness, difficulty with memory, difficulty with speech or problems with balance and coordination.  Psych: Pt reports anxiety, denies depression, SI/HI.  No other specific complaints in a complete review of systems (except as listed in HPI above).  PE:  BP 102/58  Pulse 103  Temp(Src) 98.1 F (36.7 C) (Oral)  Wt 124 lb (56.246 kg)  SpO2 95%  LMP 06/19/2013  Breastfeeding? No Wt Readings from Last 3 Encounters:  07/20/13 124 lb (56.246 kg)  07/03/13 123 lb (55.792 kg)  05/29/13 121 lb (54.885 kg)    General: Appears her stated age, well developed, well nourished in NAD.  HEENT: Head: normal shape and size; Eyes: sclera white, no icterus, conjunctiva pink, PERRLA and EOMs intact; Ears: Tm's gray and intact, normal light reflex; Nose: mucosa pink and moist, septum midline; Throat/Mouth: Teeth present, mucosa pink and moist, no lesions or ulcerations noted.  Neck: Normal range of motion. Neck supple, trachea midline. No massses, lumps or thyromegaly present.  Cardiovascular: Normal rate and rhythm. S1,S2 noted.  No murmur, rubs or gallops noted. No JVD or BLE edema. No carotid bruits noted. Pulmonary/Chest: Normal effort and positive vesicular breath sounds. No respiratory  distress. No wheezes, rales or ronchi noted.  Abdomen: Soft and nontender. Normal bowel sounds, no bruits noted. No distention or masses noted. Liver, spleen and kidneys non palpable. Musculoskeletal: Normal range of motion. No signs of joint swelling. No difficulty with gait.  Neurological: Alert and oriented. Cranial nerves II-XII intact. Coordination normal. +DTRs bilaterally. Psychiatric: Mood and affect normal. Behavior is normal. Judgment and thought content normal.    BMET    Component Value Date/Time   NA 135 09/29/2012 1612   K 3.7 09/29/2012 1612   CL 100 09/29/2012 1612   CO2 23 09/29/2012 1612   GLUCOSE 97 09/29/2012 1612   BUN 13 09/29/2012 1612   CREATININE 0.47* 09/29/2012 1612   CALCIUM 9.3 09/29/2012 1612   GFRNONAA >90 09/29/2012 1612   GFRAA >90 09/29/2012 1612    Lipid Panel  No results found for this basename: chol, trig, hdl, cholhdl, vldl, ldlcalc    CBC    Component Value Date/Time   WBC 6.7 04/12/2013 2350   RBC 3.60* 04/12/2013 2350   HGB 11.4* 04/12/2013 2350   HCT 32.1* 04/12/2013 2350   PLT 198 04/12/2013 2350   MCV 89.2 04/12/2013 2350   MCH 31.7 04/12/2013 2350   MCHC 35.5 04/12/2013 2350   RDW 13.0 04/12/2013 2350   LYMPHSABS 1.1 10/16/2012 1135   MONOABS 0.4 10/16/2012 1135   EOSABS 0.1 10/16/2012 1135   BASOSABS 0.0 10/16/2012 1135    Hgb A1C No results found for this basename: HGBA1C     Assessment and Plan:  Preventative Health Maintenance:  Pt declines screening lab work today All HM UTD

## 2013-07-20 NOTE — Patient Instructions (Signed)

## 2013-07-20 NOTE — Assessment & Plan Note (Signed)
PT has tried and failed SSRI therapy- "made me feel worse" Will try xanax 0.5 mg BID Consider counseling for CBT

## 2013-07-20 NOTE — Progress Notes (Signed)
HPI: Pt presents to office today to establish care. Pt endorses concerns with anxiety. She states she feels on edge and is unable to relax. She does endorse occasional marijuana use to help with anxiety, however she does wish to discontinue this habit. She tried taking an SSRI, unknown what medication, but endorsed having side effects of feeling worse and was unable to continue this medications. Pt states episodes happen greater than 7 times in two weeks.  LMP: 06/19/2013 Pap smear: 2014 Flu shot: 2014 Tdap: 2014 Dentist: 12/2012  Past Medical History  Diagnosis Date  . Allergy   . Dysthymic disorder   . Dysmenorrhea   . Exposure to STD   . MRSA infection   . Tobacco use disorder     Current Outpatient Prescriptions  Medication Sig Dispense Refill  . Prenatal Vit-Fe Fumarate-FA (PRENATAL MULTIVITAMIN) TABS Take 1 tablet by mouth daily.  30 tablet  11   Current Facility-Administered Medications  Medication Dose Route Frequency Provider Last Rate Last Dose  . medroxyPROGESTERone (DEPO-PROVERA) injection 150 mg  150 mg Intramuscular Q90 days Tereso Newcomer, MD   150 mg at 05/29/13 1101    No Known Allergies  Family History  Problem Relation Age of Onset  . Cancer Mother 26    one breast  . Cancer Cousin 24    breast  . Anxiety disorder Other   . Cancer Sister 28    lymph node cancer twice    History   Social History  . Marital Status: Single    Spouse Name: N/A    Number of Children: N/A  . Years of Education: N/A   Occupational History  . Not on file.   Social History Main Topics  . Smoking status: Former Smoker    Quit date: 04/13/2012  . Smokeless tobacco: Never Used  . Alcohol Use: Yes     Comment: social drinking  . 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 Sexual Activity: Yes    Birth Control/ Protection: Injection   Other Topics Concern  . Not on file   Social History  Narrative   Lives with mother      Works at [employer]          ROS:  Constitutional: Denies fever, malaise, fatigue, headache or abrupt weight changes.  HEENT: Denies eye pain, eye redness, ear pain, ringing in the ears, wax buildup, runny nose, nasal congestion, bloody nose, or sore throat. Respiratory: Denies difficulty breathing, shortness of breath, cough or sputum production.   Cardiovascular: Denies chest pain, chest tightness, palpitations or swelling in the hands or feet.  Gastrointestinal: Denies abdominal pain, bloating, constipation, diarrhea or blood in the stool.  GU: Denies frequency, urgency, pain with urination, blood in urine, odor or discharge. Musculoskeletal: Denies decrease in range of motion, difficulty with gait, muscle pain or joint pain and swelling.  Skin: Denies redness, rashes, lesions or ulcercations.  Neurological: Denies dizziness, difficulty with memory, difficulty with speech or problems with balance and coordination.  Psych: Endorses anxiety and worry. Denies depression or SI/HI  No other specific complaints in a complete review of systems (except as listed in HPI above).  PE:  BP 102/58  Pulse 103  Temp(Src) 98.1 F (36.7 C) (Oral)  Wt 124 lb (56.246 kg)  SpO2 95%  LMP 06/19/2013  Breastfeeding? No Wt Readings from Last 3 Encounters:  07/20/13 124 lb (56.246  kg)  07/03/13 123 lb (55.792 kg)  05/29/13 121 lb (54.885 kg)    General: Appears their stated age, well developed, well nourished in NAD. HEENT: Head: normal shape and size; Eyes: sclera white, no icterus, conjunctiva pink, PERRLA and EOMs intact; Ears: Tm's gray and intact, normal light reflex; Nose: mucosa pink and moist, septum midline; Throat/Mouth: Teeth present, mucosa pink and moist, no lesions or ulcerations noted.  Neck: Normal range of motion. Neck supple, trachea midline. No massses, lumps or thyromegaly present.  Cardiovascular: Normal rate and rhythm. S1,S2 noted.   No murmur, rubs or gallops noted. No JVD or BLE edema. No carotid bruits noted. Pulmonary/Chest: Normal effort and positive vesicular breath sounds. No respiratory distress. No wheezes, rales or ronchi noted.  Abdomen: Soft and nontender. Normal bowel sounds, no bruits noted. No distention or masses noted. Liver, spleen and kidneys non palpable. Musculoskeletal: Normal range of motion. No signs of joint swelling. No difficulty with gait.  Neurological: Alert and oriented. Cranial nerves II-XII intact. Coordination normal. +DTRs bilaterally. Psychiatric: Mood and affect normal. Behavior is normal. Judgment and thought content normal.   EKG:  BMET    Component Value Date/Time   NA 135 09/29/2012 1612   K 3.7 09/29/2012 1612   CL 100 09/29/2012 1612   CO2 23 09/29/2012 1612   GLUCOSE 97 09/29/2012 1612   BUN 13 09/29/2012 1612   CREATININE 0.47* 09/29/2012 1612   CALCIUM 9.3 09/29/2012 1612   GFRNONAA >90 09/29/2012 1612   GFRAA >90 09/29/2012 1612    Lipid Panel  No results found for this basename: chol, trig, hdl, cholhdl, vldl, ldlcalc    CBC    Component Value Date/Time   WBC 6.7 04/12/2013 2350   RBC 3.60* 04/12/2013 2350   HGB 11.4* 04/12/2013 2350   HCT 32.1* 04/12/2013 2350   PLT 198 04/12/2013 2350   MCV 89.2 04/12/2013 2350   MCH 31.7 04/12/2013 2350   MCHC 35.5 04/12/2013 2350   RDW 13.0 04/12/2013 2350   LYMPHSABS 1.1 10/16/2012 1135   MONOABS 0.4 10/16/2012 1135   EOSABS 0.1 10/16/2012 1135   BASOSABS 0.0 10/16/2012 1135    Hgb A1C No results found for this basename: HGBA1C     Assessment and Plan: Health Maintenance: No labs at this time, pt refused Will follow up in one year for physical  Anxiety: Scored 11 on anxiety screening tool, moderate anxiety Prescribed Xanax 0.5mg  one tablet by mouth twice daily as needed for anxiety. #60, no refills Follow up in 3 months, required drug screen at visit  Leeanne Mannan, Student-NP

## 2013-07-20 NOTE — Progress Notes (Signed)
Pre-visit discussion using our clinic review tool. No additional management support is needed unless otherwise documented below in the visit note.  

## 2013-08-24 ENCOUNTER — Telehealth: Payer: Self-pay

## 2013-08-24 NOTE — Telephone Encounter (Signed)
Pt started with S/T and hoarsness 08/23/13; no CP or SOB. Pt is at work and has to be seen 9-10 am. Offered pt different appt times today but pt said she has to be seen 9-10 am. [facility] had no available, offered [facility] and pt said no has to work. Advised pt to be seen between 9-10 am today to go to [facility]. Pt voiced understanding.

## 2013-08-24 NOTE — Telephone Encounter (Signed)
noted 

## 2013-08-24 NOTE — Telephone Encounter (Signed)
Patient Information:  Caller Name: Aisa  Phone: [phone]  Patient: Taylor Bautista  Gender: Female  DOB: 07-24-1991  Age: 22 Years  PCP: [facility] [employer])  Pregnant: No  Office Follow Up:  Does the office need to follow up with this patient?: No  Instructions For The Office: N/A   Symptoms  Reason For Call & Symptoms: Pt is calling and states that she has a sore throat; sx started 08/23/2013; body aches noted as well; no fever;  very hoarse voice  Reviewed Health History In EMR: Yes  Reviewed Medications In EMR: Yes  Reviewed Allergies In EMR: Yes  Reviewed Surgeries / Procedures: Yes  Date of Onset of Symptoms: 08/23/2013 OB / GYN:  LMP: Unknown  Guideline(s) Used:  Sore Throat  Disposition Per Guideline:   See Today in Office  Reason For Disposition Reached:   Severe sore throat pain  No appt available at [facility]; pt offered an appt at [facility] at 11:15am today; pt states that that is too late and she will just go to UC because she can not leave work.  RN unable to give Care Advice due to pt had to get off the phone at work.   Call Back If:  You become worse.  Patient Will Follow Care Advice:  YES

## 2013-08-28 ENCOUNTER — Ambulatory Visit: Payer: 59

## 2013-09-03 ENCOUNTER — Ambulatory Visit (INDEPENDENT_AMBULATORY_CARE_PROVIDER_SITE_OTHER): Payer: 59 | Admitting: *Deleted

## 2013-09-03 DIAGNOSIS — Z3049 Encounter for surveillance of other contraceptives: Secondary | ICD-10-CM

## 2013-09-03 DIAGNOSIS — Z3042 Encounter for surveillance of injectable contraceptive: Secondary | ICD-10-CM

## 2013-09-04 ENCOUNTER — Ambulatory Visit: Payer: 59

## 2013-09-11 ENCOUNTER — Other Ambulatory Visit: Payer: Self-pay

## 2013-09-11 DIAGNOSIS — F411 Generalized anxiety disorder: Secondary | ICD-10-CM

## 2013-09-11 MED ORDER — ALPRAZOLAM 0.5 MG PO TABS: 0.5000 mg | ORAL_TABLET | Freq: Two times a day (BID) | ORAL | Status: DC | PRN

## 2013-09-11 NOTE — Telephone Encounter (Signed)
Pt request refill alprazolam to [facility] pharmacy. Pt is out of med and wants to pick up today. Pt request cb when med called to [facility].

## 2013-09-11 NOTE — Telephone Encounter (Signed)
Ok to phone in xanax 

## 2013-09-11 NOTE — Telephone Encounter (Signed)
Rx called in to pharmacy. 

## 2013-09-25 ENCOUNTER — Ambulatory Visit: Payer: Self-pay | Admitting: Obstetrics & Gynecology

## 2013-09-25 DIAGNOSIS — Z01419 Encounter for gynecological examination (general) (routine) without abnormal findings: Secondary | ICD-10-CM

## 2013-10-11 ENCOUNTER — Other Ambulatory Visit: Payer: Self-pay

## 2013-10-11 DIAGNOSIS — F411 Generalized anxiety disorder: Secondary | ICD-10-CM

## 2013-10-11 MED ORDER — ALPRAZOLAM 0.5 MG PO TABS: 0.5000 mg | ORAL_TABLET | Freq: Two times a day (BID) | ORAL | Status: DC | PRN

## 2013-10-11 NOTE — Telephone Encounter (Signed)
Ok to phone in xanax 

## 2013-10-11 NOTE — Telephone Encounter (Signed)
Rx called in to pharmacy. 

## 2013-10-11 NOTE — Telephone Encounter (Signed)
Pt left v/m [facility]. Please advise. Pt request cb.

## 2013-10-31 ENCOUNTER — Encounter (HOSPITAL_COMMUNITY): Payer: Self-pay | Admitting: Emergency Medicine

## 2013-10-31 ENCOUNTER — Emergency Department (HOSPITAL_COMMUNITY): Payer: 59

## 2013-10-31 ENCOUNTER — Emergency Department (HOSPITAL_COMMUNITY)
Admission: EM | Admit: 2013-10-31 | Discharge: 2013-10-31 | Disposition: A | Discharge status: Home / Self Care | Payer: 59 | Attending: Emergency Medicine | Admitting: Emergency Medicine

## 2013-10-31 DIAGNOSIS — R209 Unspecified disturbances of skin sensation: Secondary | ICD-10-CM | POA: Insufficient documentation

## 2013-10-31 DIAGNOSIS — Z87891 Personal history of nicotine dependence: Secondary | ICD-10-CM | POA: Insufficient documentation

## 2013-10-31 DIAGNOSIS — Z8742 Personal history of other diseases of the female genital tract: Secondary | ICD-10-CM | POA: Insufficient documentation

## 2013-10-31 DIAGNOSIS — M79672 Pain in left foot: Secondary | ICD-10-CM

## 2013-10-31 DIAGNOSIS — Z8659 Personal history of other mental and behavioral disorders: Secondary | ICD-10-CM | POA: Insufficient documentation

## 2013-10-31 DIAGNOSIS — Z8619 Personal history of other infectious and parasitic diseases: Secondary | ICD-10-CM | POA: Insufficient documentation

## 2013-10-31 DIAGNOSIS — M79609 Pain in unspecified limb: Secondary | ICD-10-CM | POA: Insufficient documentation

## 2013-10-31 MED ORDER — IBUPROFEN 800 MG PO TABS: 800.0000 mg | ORAL_TABLET | Freq: Three times a day (TID) | ORAL | Status: DC | PRN

## 2013-10-31 MED ORDER — HYDROCODONE-ACETAMINOPHEN 5-325 MG PO TABS: 1.0000 | ORAL_TABLET | ORAL | Status: DC | PRN

## 2013-10-31 NOTE — ED Notes (Signed)
Pt c/o left foot pain at great toe x 2 days; pt denies obvious injury

## 2013-10-31 NOTE — ED Provider Notes (Signed)
CSN: [id number]     Arrival date & time 10/31/13  0815 History   First MD Initiated Contact with Patient 10/31/13 [phone]     Chief Complaint  Patient presents with  . Foot Pain     (Consider location/radiation/quality/duration/timing/severity/associated sxs/prior Treatment) HPI Patient presents with two days of left foot pain that she woke up with.  Pain is throbbing, located over her dorsal foot and 1st MTP, 7/10 at rest and 10/10 with walking.  Denies any injury/trauma, any new shoes, prolonged standing.  Denies fevers, chills, weakness.     Past Medical History  Diagnosis Date  . Allergy   . Dysthymic disorder   . Dysmenorrhea   . Exposure to STD   . MRSA infection   . Tobacco use disorder    Past Surgical History  Procedure Laterality Date  . Pigmented nevus removal  04/1989   Family History  Problem Relation Age of Onset  . Cancer Mother 6839    one breast  . Cancer Cousin 24    breast  . Anxiety disorder Other   . Cancer Sister 5423    lymph node cancer twice   History  Substance Use Topics  . Smoking status: Former Smoker    Quit date: 04/13/2012  . Smokeless tobacco: Never Used  . Alcohol Use: Yes     Comment: social drinking   OB History   Grav Para Term Preterm Abortions TAB SAB Ect Mult Living   3 2 1 1 1 1  0 0 0 2     Review of Systems  Constitutional: Negative for fever.  Respiratory: Negative for shortness of breath.   Cardiovascular: Negative for chest pain.  Genitourinary: Negative for vaginal discharge.  Neurological: Positive for numbness. Negative for weakness.      Allergies  Review of patient's allergies indicates no known allergies.  Home Medications   Current Outpatient Rx  Name  Route  Sig  Dispense  Refill  . ALPRAZolam (XANAX) 0.5 MG tablet   Oral   Take 1 tablet (0.5 mg total) by mouth 2 (two) times daily as needed for anxiety.   60 tablet   0   . ibuprofen (ADVIL,MOTRIN) 200 MG tablet   Oral   Take 800 mg by mouth every 6  (six) hours as needed for moderate pain.          BP 103/73  Pulse 104  Temp(Src) 99.2 F (37.3 C) (Oral)  Resp 18  SpO2 99% Physical Exam  Nursing note and vitals reviewed. Constitutional: She appears well-developed and well-nourished. No distress.  HENT:  Head: Normocephalic and atraumatic.  Neck: Neck supple.  Pulmonary/Chest: Effort normal.  Musculoskeletal:       Left ankle: Normal.       Left lower leg: Normal. She exhibits no tenderness, no swelling, no edema and no deformity.       Feet:  Capillary refill < 2 seconds throughout left foot.  Dorsalis pedis and posterior tibialis pulses are intact.   Tender to palpation over dorsal and plantar aspect of foot from 1st MTP through mid tarsal area and through forefoot 1st-3rd metatarsals.  No erythema, edema, warmth, ecchymosis.  No break in skin.   Neurological: She is alert.  Skin: She is not diaphoretic.    ED Course  Procedures (including critical care time) Labs Review Labs Reviewed - No data to display Imaging Review Dg Foot Complete Left  10/31/2013   CLINICAL DATA:  Pain  EXAM: LEFT FOOT - COMPLETE  3+ VIEW  COMPARISON:  None.  FINDINGS: Frontal, oblique, and lateral views were obtained. No fracture or dislocation. Joint spaces appear intact. No erosive change.  IMPRESSION: No abnormality noted.   Electronically Signed   By: Bretta Bang M.D.   On: 10/31/2013 09:04    EKG Interpretation   None       MDM   Final diagnoses:  Left foot pain   Afebrile nontoxic patient with two days atraumatic left foot pain, mostly over 1st MTP and surrounding area.  No erythema, edema, warmth.  No skin changes.  Xray negative.  Pt declines crutches.  D/C home with PCP follow up and podiatry if needed.  Vicodin #10, ibuprofen, post op shoe.  Discussed result, findings, treatment, and follow up  with patient.  Pt given return precautions.  Pt verbalizes understanding and agrees with plan.      I doubt any other EMC  precluding discharge at this time including, but not necessarily limited to the following: septic joint, gout.     [place], PA-C 10/31/13 [phone]

## 2013-10-31 NOTE — Discharge Instructions (Signed)
Read the information below.  Use the prescribed medication as directed.  Please discuss all new medications with your pharmacist.  Do not take additional tylenol while taking the prescribed pain medication to avoid overdose.  You may return to the Emergency Department at any time for worsening condition or any new symptoms that concern you.  If there is any possibility that you might be pregnant, please let your health care provider know and discuss this with the pharmacist to ensure medication safety. If you develop uncontrolled pain, weakness or numbness of the extremity, severe discoloration of the skin, or you are unable to walk, return to the ER for a recheck.     Musculoskeletal Pain Musculoskeletal pain is muscle and boney aches and pains. These pains can occur in any part of the body. Your caregiver may treat you without knowing the cause of the pain. They may treat you if blood or urine tests, X-rays, and other tests were normal.  CAUSES There is often not a definite cause or reason for these pains. These pains may be caused by a type of germ (virus). The discomfort may also come from overuse. Overuse includes working out too hard when your body is not fit. Boney aches also come from weather changes. Bone is sensitive to atmospheric pressure changes. HOME CARE INSTRUCTIONS   Ask when your test results will be ready. Make sure you get your test results.  Only take over-the-counter or prescription medicines for pain, discomfort, or fever as directed by your caregiver. If you were given medications for your condition, do not drive, operate machinery or power tools, or sign legal documents for 24 hours. Do not drink alcohol. Do not take sleeping pills or other medications that may interfere with treatment.  Continue all activities unless the activities cause more pain. When the pain lessens, slowly resume normal activities. Gradually increase the intensity and duration of the activities or  exercise.  During periods of severe pain, bed rest may be helpful. Lay or sit in any position that is comfortable.  Putting ice on the injured area.  Put ice in a bag.  Place a towel between your skin and the bag.  Leave the ice on for 15 to 20 minutes, 3 to 4 times a day.  Follow up with your caregiver for continued problems and no reason can be found for the pain. If the pain becomes worse or does not go away, it may be necessary to repeat tests or do additional testing. Your caregiver may need to look further for a possible cause. SEEK IMMEDIATE MEDICAL CARE IF:  You have pain that is getting worse and is not relieved by medications.  You develop chest pain that is associated with shortness or breath, sweating, feeling sick to your stomach (nauseous), or throw up (vomit).  Your pain becomes localized to the abdomen.  You develop any new symptoms that seem different or that concern you. MAKE SURE YOU:   Understand these instructions.  Will watch your condition.  Will get help right away if you are not doing well or get worse. Document Released: 08/30/2005 Document Revised: 11/22/2011 Document Reviewed: 05/04/2013 [employer] Patient Information 2014 [place].

## 2013-10-31 NOTE — ED Provider Notes (Signed)
Medical screening examination/treatment/procedure(s) were performed by non-physician practitioner and as supervising physician I was immediately available for consultation/collaboration.  EKG Interpretation   None         Lyanne CoKevin M Naevia Unterreiner, MD 10/31/13 [phone]

## 2013-11-02 ENCOUNTER — Other Ambulatory Visit: Payer: Self-pay | Admitting: *Deleted

## 2013-11-02 MED ORDER — PRENATAL PLUS 27-1 MG PO TABS: 1.0000 | ORAL_TABLET | Freq: Every day | ORAL | Status: DC

## 2013-11-02 NOTE — Telephone Encounter (Signed)
Received a refill request from patients pharmacy for Prenatal vitamins.  I have sent in refills to her pharmacy.

## 2013-11-15 ENCOUNTER — Other Ambulatory Visit: Payer: Self-pay

## 2013-11-15 DIAGNOSIS — F411 Generalized anxiety disorder: Secondary | ICD-10-CM

## 2013-11-15 NOTE — Telephone Encounter (Signed)
Pt left vm requesting [facility].Please advise.  

## 2013-11-16 MED ORDER — ALPRAZOLAM 0.5 MG PO TABS: 0.5000 mg | ORAL_TABLET | Freq: Two times a day (BID) | ORAL | Status: DC | PRN

## 2013-11-16 NOTE — Telephone Encounter (Signed)
Phoned in to pharmacy. 

## 2013-11-16 NOTE — Telephone Encounter (Signed)
Ok to phone in xanax 

## 2013-11-27 ENCOUNTER — Ambulatory Visit: Payer: 59 | Admitting: Obstetrics & Gynecology

## 2013-11-27 DIAGNOSIS — Z01419 Encounter for gynecological examination (general) (routine) without abnormal findings: Secondary | ICD-10-CM

## 2013-12-06 ENCOUNTER — Other Ambulatory Visit (INDEPENDENT_AMBULATORY_CARE_PROVIDER_SITE_OTHER): Payer: 59 | Admitting: *Deleted

## 2013-12-06 DIAGNOSIS — Z3049 Encounter for surveillance of other contraceptives: Secondary | ICD-10-CM

## 2013-12-06 DIAGNOSIS — Z01812 Encounter for preprocedural laboratory examination: Secondary | ICD-10-CM

## 2013-12-06 DIAGNOSIS — Z3042 Encounter for surveillance of injectable contraceptive: Secondary | ICD-10-CM

## 2013-12-06 LAB — POCT URINE PREGNANCY: PREG TEST UR: NEGATIVE

## 2013-12-06 MED ORDER — MEDROXYPROGESTERONE ACETATE 150 MG/ML IM SUSP
150.0000 mg | Freq: Once | INTRAMUSCULAR | Status: AC
  Administered 2013-12-06: 150 mg via INTRAMUSCULAR

## 2013-12-06 NOTE — Progress Notes (Signed)
Pt here today for her Depo Provera.  UPT negative today in office. 

## 2013-12-27 ENCOUNTER — Other Ambulatory Visit: Payer: Self-pay

## 2013-12-27 DIAGNOSIS — F411 Generalized anxiety disorder: Secondary | ICD-10-CM

## 2013-12-27 MED ORDER — ALPRAZOLAM 0.5 MG PO TABS: 0.5000 mg | ORAL_TABLET | Freq: Two times a day (BID) | ORAL | Status: DC | PRN

## 2013-12-27 NOTE — Telephone Encounter (Signed)
Rx called in to pharmacy. 

## 2013-12-27 NOTE — Telephone Encounter (Signed)
Ok yo phone in xanax

## 2013-12-27 NOTE — Telephone Encounter (Signed)
Pt left v/m requesting refill alprazolam to [facility]. Please advise.

## 2013-12-28 ENCOUNTER — Telehealth: Payer: Self-pay | Admitting: *Deleted

## 2013-12-28 DIAGNOSIS — N390 Urinary tract infection, site not specified: Secondary | ICD-10-CM

## 2013-12-28 MED ORDER — CIPROFLOXACIN HCL 500 MG PO TABS: 500.0000 mg | ORAL_TABLET | Freq: Two times a day (BID) | ORAL | Status: DC

## 2013-12-28 NOTE — Telephone Encounter (Signed)
Patient is having symptoms of pain with urination and increased frequency.  She would like something called in for uti as it is Friday afternoon and she is unable to be seen for an appointment.  She will follow up if her symptoms continue or change.

## 2014-01-23 ENCOUNTER — Other Ambulatory Visit: Payer: 59

## 2014-01-29 ENCOUNTER — Other Ambulatory Visit: Payer: Self-pay

## 2014-01-29 DIAGNOSIS — F411 Generalized anxiety disorder: Secondary | ICD-10-CM

## 2014-01-29 MED ORDER — ALPRAZOLAM 0.5 MG PO TABS: 0.5000 mg | ORAL_TABLET | Freq: Two times a day (BID) | ORAL | Status: DC | PRN

## 2014-01-29 NOTE — Telephone Encounter (Signed)
Ok to phone in xanax 

## 2014-01-29 NOTE — Telephone Encounter (Signed)
Rx called in to pharmacy. 

## 2014-01-29 NOTE — Telephone Encounter (Signed)
Pt left v/m requesting refill alprazolam to [facility].Please advise.

## 2014-02-27 ENCOUNTER — Other Ambulatory Visit: Payer: Self-pay

## 2014-02-27 DIAGNOSIS — F411 Generalized anxiety disorder: Secondary | ICD-10-CM

## 2014-02-27 MED ORDER — ALPRAZOLAM 0.5 MG PO TABS: 0.5000 mg | ORAL_TABLET | Freq: Two times a day (BID) | ORAL | Status: DC | PRN

## 2014-02-27 NOTE — Telephone Encounter (Signed)
Pt left v[facility]m [facility]Please advise.

## 2014-02-27 NOTE — Telephone Encounter (Signed)
Ok to phone in xanax on or after 03/01/14

## 2014-03-01 NOTE — Telephone Encounter (Signed)
Rx called in to pharmacy. 

## 2014-03-01 NOTE — Telephone Encounter (Signed)
Pt left v/m for status of alprazolam refill; called pt back no answer and no v/m set up.

## 2014-03-04 ENCOUNTER — Ambulatory Visit: Payer: 59

## 2014-03-11 ENCOUNTER — Other Ambulatory Visit: Payer: 59

## 2014-03-12 ENCOUNTER — Other Ambulatory Visit (INDEPENDENT_AMBULATORY_CARE_PROVIDER_SITE_OTHER): Payer: 59

## 2014-03-12 DIAGNOSIS — Z3049 Encounter for surveillance of other contraceptives: Secondary | ICD-10-CM

## 2014-03-29 ENCOUNTER — Other Ambulatory Visit: Payer: Self-pay

## 2014-03-29 DIAGNOSIS — F411 Generalized anxiety disorder: Secondary | ICD-10-CM

## 2014-03-29 MED ORDER — ALPRAZOLAM 0.5 MG PO TABS: 0.5000 mg | ORAL_TABLET | Freq: Two times a day (BID) | ORAL | Status: DC | PRN

## 2014-03-29 NOTE — Telephone Encounter (Signed)
Rx called in to pharmacy. 

## 2014-03-29 NOTE — Telephone Encounter (Signed)
Pt request refill alprazolam to [facility]. Pt realized would be out of med on Sunday. Pt will ck with pharmacy about refill.Please advise.

## 2014-03-29 NOTE — Telephone Encounter (Signed)
Ok to phone in xanax 

## 2014-04-04 ENCOUNTER — Ambulatory Visit (INDEPENDENT_AMBULATORY_CARE_PROVIDER_SITE_OTHER): Payer: 59 | Admitting: Obstetrics & Gynecology

## 2014-04-04 ENCOUNTER — Encounter: Payer: Self-pay | Admitting: Obstetrics & Gynecology

## 2014-04-04 VITALS — BP 100/70 | HR 84 | Ht 66.0 in | Wt 130.4 lb

## 2014-04-04 DIAGNOSIS — R300 Dysuria: Secondary | ICD-10-CM

## 2014-04-04 DIAGNOSIS — N899 Noninflammatory disorder of vagina, unspecified: Secondary | ICD-10-CM

## 2014-04-04 DIAGNOSIS — Z3009 Encounter for other general counseling and advice on contraception: Secondary | ICD-10-CM

## 2014-04-04 DIAGNOSIS — Z0000 Encounter for general adult medical examination without abnormal findings: Secondary | ICD-10-CM

## 2014-04-04 DIAGNOSIS — IMO0002 Reserved for concepts with insufficient information to code with codable children: Secondary | ICD-10-CM

## 2014-04-04 DIAGNOSIS — Z01812 Encounter for preprocedural laboratory examination: Secondary | ICD-10-CM

## 2014-04-04 DIAGNOSIS — Z30430 Encounter for insertion of intrauterine contraceptive device: Secondary | ICD-10-CM

## 2014-04-04 LAB — POCT URINALYSIS DIPSTICK
Bilirubin, UA: NEGATIVE
Glucose, UA: NEGATIVE
Leukocytes, UA: NEGATIVE
Nitrite, UA: NEGATIVE
PH UA: 6
Protein, UA: NEGATIVE
Spec Grav, UA: >=1.03
UROBILINOGEN UA: NEGATIVE

## 2014-04-04 LAB — POCT URINE PREGNANCY: PREG TEST UR: NEGATIVE

## 2014-04-04 MED ORDER — ESTRADIOL 10 MCG VA TABS: ORAL_TABLET | VAGINAL | Status: DC

## 2014-04-04 MED ORDER — PRENATAL PLUS 27-1 MG PO TABS: 1.0000 | ORAL_TABLET | Freq: Every day | ORAL | Status: DC

## 2014-04-04 NOTE — Progress Notes (Signed)
   Subjective:    Patient ID: Taylor Bautista, female    DOB: 01/02/1991, 23 y.o.   MRN: [id number]  HPI  23 yo SW P2 is here with the complaint of vaginal dryness all the time, including with sex (with adequate foreplay). She says that this has only been a problem since she started depo provera a year ago. She does not think that she wants more children. She has tried OCPs in the past but says that she does not remember to take them.  Review of Systems     Objective:   Physical Exam  Her vulva and vaginaappear normal.  UPT negative, consent signed, Time out procedure done. Cervix prepped with betadine and grasped with a single tooth tenaculum. Mirena was easily placed and the strings were cut to 3-4 cm. Uterus sounded to 9 cm. She tolerated the procedure well.        Assessment & Plan:  Contraception- She wants to stop using the depo provera and switch to Mirena Vaginal dryness- I have prescribed vagifem to be used for the next few months until the depo provera is out of her system. New partner- check STIs

## 2014-04-04 NOTE — Progress Notes (Signed)
Patient is having increased vaginal dryness and irritation.  She is having some burning with urination but her urinalysis is within normal limits just a small trace of red blood cells is seen.

## 2014-04-05 LAB — GC/CHLAMYDIA PROBE AMP
CT Probe RNA: NEGATIVE
GC Probe RNA: NEGATIVE

## 2014-04-05 LAB — TSH: TSH: 1.162 u[IU]/mL (ref 0.350–4.500)

## 2014-04-05 LAB — HEPATITIS C ANTIBODY: HCV AB: NEGATIVE

## 2014-04-05 LAB — RPR

## 2014-04-05 LAB — HIV ANTIBODY (ROUTINE TESTING W REFLEX): HIV 1&2 Ab, 4th Generation: NONREACTIVE

## 2014-04-05 LAB — HEPATITIS B SURFACE ANTIGEN: Hepatitis B Surface Ag: NEGATIVE

## 2014-04-05 NOTE — Progress Notes (Signed)
Patient called back RX for vaginal estrogen was going to be 140.00 dollars.  I have called in compounded estrogen vaginal cream 2% to [facility] pharmacy.  Patient can get this for 45.00 for a three month supply.

## 2014-04-29 ENCOUNTER — Other Ambulatory Visit: Payer: Self-pay | Admitting: Internal Medicine

## 2014-04-29 NOTE — Telephone Encounter (Signed)
OK to phone in xanaxd

## 2014-04-29 NOTE — Telephone Encounter (Signed)
Pt left vm requesting [facility].Please advise.  

## 2014-04-29 NOTE — Telephone Encounter (Signed)
Rx called in to pharmacy. 

## 2014-04-30 ENCOUNTER — Ambulatory Visit (INDEPENDENT_AMBULATORY_CARE_PROVIDER_SITE_OTHER): Payer: 59 | Admitting: Internal Medicine

## 2014-04-30 ENCOUNTER — Encounter: Payer: Self-pay | Admitting: *Deleted

## 2014-04-30 ENCOUNTER — Encounter: Payer: Self-pay | Admitting: Internal Medicine

## 2014-04-30 VITALS — BP 112/66 | HR 76 | Temp 98.4°F | Wt 131.0 lb

## 2014-04-30 DIAGNOSIS — F411 Generalized anxiety disorder: Secondary | ICD-10-CM

## 2014-04-30 NOTE — Addendum Note (Signed)
Addended by: Lorre MunroeBAITY, REGINA W on: 04/30/2014 09:20 AM   Modules accepted: Orders

## 2014-04-30 NOTE — Assessment & Plan Note (Signed)
She wants me to increase her xanax to 1 mg BID- I declined this d/t her history of substance abuse and risk for addiction I advised her we could try a different SSRI- she is completely against trying thist- stating "it didn't work for me or my sister before" I encouraged her to try CBT, she states : "I don't need therapy" Offered psych referral- she does not think she will go but she will allow me to refer her in case she changes her mind  Will set up for CSA and drug screen today

## 2014-04-30 NOTE — Progress Notes (Signed)
Subjective:    Patient ID: Danne Baxterandice R Jones, female    DOB: 02/15/1991, 23 y.o.   MRN: [id number]  HPI  Pt presents to the clinic today to follow up anxiety. She is on xanax 0.5 mg BID prn for anxiety. She reports that she is having to take about 4 per day because the effect does not last long enough. She only takes the xanax 3 days per week. She has been on antidepressants in the past for anxiety but reports they seemed to make her anxiety worse. She has has no feelings of depression. She denies SI/HI.  Review of Systems      Past Medical History  Diagnosis Date  . Allergy   . Dysthymic disorder   . Dysmenorrhea   . Exposure to STD   . MRSA infection   . Tobacco use disorder     Current Outpatient Prescriptions  Medication Sig Dispense Refill  . ALPRAZolam (XANAX) 0.5 MG tablet TAKE ONE TABLET BY MOUTH TWICE DAILY AS NEEDED  60 tablet  0  . Estradiol 10 MCG TABS vaginal tablet 1 tablet per vagina for a week and then every other night  30 tablet  3  . ibuprofen (ADVIL,MOTRIN) 200 MG tablet Take 800 mg by mouth every 6 (six) hours as needed for moderate pain.      Marland Kitchen. ibuprofen (ADVIL,MOTRIN) 800 MG tablet Take 1 tablet (800 mg total) by mouth every 8 (eight) hours as needed for mild pain or moderate pain.  15 tablet  0  . prenatal vitamin w/FE, FA (PRENATAL 1 + 1) 27-1 MG TABS tablet Take 1 tablet by mouth daily at 12 noon.  30 each  6   Current Facility-Administered Medications  Medication Dose Route Frequency Provider Last Rate Last Dose  . medroxyPROGESTERone (DEPO-PROVERA) injection 150 mg  150 mg Intramuscular Q90 days Tereso NewcomerUgonna A Anyanwu, MD   150 mg at 03/12/14 1345    No Known Allergies  Family History  Problem Relation Age of Onset  . Cancer Mother 1539    one breast  . Cancer Cousin 24    breast  . Anxiety disorder Other   . Cancer Sister 523    lymph node cancer twice    History   Social History  . Marital Status: Single    Spouse Name: N/A    Number of  Children: N/A  . Years of Education: N/A   Occupational History  . Not on file.   Social History Main Topics  . Smoking status: Former Smoker    Quit date: 04/13/2012  . Smokeless tobacco: Never Used  . Alcohol Use: Yes     Comment: social drinking  . 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 Sexual Activity: Yes    Partners: Male    Birth Control/ Protection: Injection   Other Topics Concern  . Not on file   Social History Narrative   Lives with mother      Works at [employer]           Constitutional: Denies fever, malaise, fatigue, headache or abrupt weight changes.  Psych: Pt reports anxiety. Denies depression, SI/HI.  No other specific complaints in a complete review of systems (except as listed in HPI above).  Objective:   Physical Exam   BP 112/66  Pulse 76  Temp(Src) 98.4 F (36.9 C) (Oral)  Wt 131 lb (59.421 kg)  SpO2 99%  Breastfeeding? No Wt Readings from Last 3 Encounters:  04/30/14 131 lb (59.421 kg)  04/04/14 130 lb 6.4 oz (59.149 kg)  07/20/13 124 lb (56.246 kg)    General: Appears her stated age, well developed, well nourished in NAD. Cardiovascular: Normal rate and rhythm. S1,S2 noted.  No murmur, rubs or gallops noted. No JVD or BLE edema. No carotid bruits noted. Pulmonary/Chest: Normal effort and positive vesicular breath sounds. No respiratory distress. No wheezes, rales or ronchi noted.  Psychiatric: Mood and affect normal. Behavior is normal. Judgment and thought content normal.     BMET    Component Value Date/Time   NA 135 09/29/2012 1612   K 3.7 09/29/2012 1612   CL 100 09/29/2012 1612   CO2 23 09/29/2012 1612   GLUCOSE 97 09/29/2012 1612   BUN 13 09/29/2012 1612   CREATININE 0.47* 09/29/2012 1612   CALCIUM 9.3 09/29/2012 1612   GFRNONAA >90 09/29/2012 1612   GFRAA >90 09/29/2012 1612    Lipid Panel  No results found for this basename: chol, trig, hdl, cholhdl, vldl,  ldlcalc    CBC    Component Value Date/Time   WBC 6.7 04/12/2013 2350   RBC 3.60* 04/12/2013 2350   HGB 11.4* 04/12/2013 2350   HCT 32.1* 04/12/2013 2350   PLT 198 04/12/2013 2350   MCV 89.2 04/12/2013 2350   MCH 31.7 04/12/2013 2350   MCHC 35.5 04/12/2013 2350   RDW 13.0 04/12/2013 2350   LYMPHSABS 1.1 10/16/2012 1135   MONOABS 0.4 10/16/2012 1135   EOSABS 0.1 10/16/2012 1135   BASOSABS 0.0 10/16/2012 1135    Hgb A1C No results found for this basename: HGBA1C        Assessment & Plan:

## 2014-04-30 NOTE — Patient Instructions (Addendum)
Generalized Anxiety Disorder Generalized anxiety disorder (GAD) is a mental disorder. It interferes with life functions, including relationships, work, and school. GAD is different from normal anxiety, which everyone experiences at some point in their lives in response to specific life events and activities. Normal anxiety actually helps us prepare for and get through these life events and activities. Normal anxiety goes away after the event or activity is over.  GAD causes anxiety that is not necessarily related to specific events or activities. It also causes excess anxiety in proportion to specific events or activities. The anxiety associated with GAD is also difficult to control. GAD can vary from mild to severe. People with severe GAD can have intense waves of anxiety with physical symptoms (panic attacks).  SYMPTOMS The anxiety and worry associated with GAD are difficult to control. This anxiety and worry are related to many life events and activities and also occur more days than not for 6 months or longer. People with GAD also have three or more of the following symptoms (one or more in children):  Restlessness.   Fatigue.  Difficulty concentrating.   Irritability.  Muscle tension.  Difficulty sleeping or unsatisfying sleep. DIAGNOSIS GAD is diagnosed through an assessment by your health care provider. Your health care provider will ask you questions aboutyour mood,physical symptoms, and events in your life. Your health care provider may ask you about your medical history and use of alcohol or drugs, including prescription medicines. Your health care provider may also do a physical exam and blood tests. Certain medical conditions and the use of certain substances can cause symptoms similar to those associated with GAD. Your health care provider may refer you to a mental health specialist for further evaluation. TREATMENT The following therapies are usually used to treat GAD:    Medication. Antidepressant medication usually is prescribed for long-term daily control. Antianxiety medicines may be added in severe cases, especially when panic attacks occur.   Talk therapy (psychotherapy). Certain types of talk therapy can be helpful in treating GAD by providing support, education, and guidance. A form of talk therapy called cognitive behavioral therapy can teach you healthy ways to think about and react to daily life events and activities.  Stress managementtechniques. These include yoga, meditation, and exercise and can be very helpful when they are practiced regularly. A mental health specialist can help determine which treatment is best for you. Some people see improvement with one therapy. However, other people require a combination of therapies. Document Released: 12/25/2012 Document Revised: 01/14/2014 Document Reviewed: 12/25/2012 Exit[employer] Patient Information 2015 [employer]. This information is not intended to replace advice given to you by your health care provider. Make sure you discuss any questions you have with your health care provider.  

## 2014-04-30 NOTE — Progress Notes (Signed)
Pre visit review using our clinic review tool, if applicable. No additional management support is needed unless otherwise documented below in the visit note. 

## 2014-05-21 ENCOUNTER — Encounter: Payer: Self-pay | Admitting: Internal Medicine

## 2014-05-30 ENCOUNTER — Other Ambulatory Visit: Payer: Self-pay

## 2014-05-30 MED ORDER — ALPRAZOLAM 0.5 MG PO TABS: ORAL_TABLET | ORAL | Status: DC

## 2014-05-30 NOTE — Telephone Encounter (Signed)
Pt left v/m requesting refill alprazolam to [facility]. Please advise.  

## 2014-05-30 NOTE — Telephone Encounter (Signed)
Ok to phone in xanax 

## 2014-05-30 NOTE — Telephone Encounter (Signed)
Pt wanting to pick up at [facility] today at 12:30; Medication phoned to [facility] pharmacy as instructed;spoke with [place] and she will get rx ready for pick up. Rose will let pt know rx called in.

## 2014-06-03 ENCOUNTER — Encounter: Payer: Self-pay | Admitting: Internal Medicine

## 2014-06-03 ENCOUNTER — Ambulatory Visit (INDEPENDENT_AMBULATORY_CARE_PROVIDER_SITE_OTHER): Payer: 59 | Admitting: Internal Medicine

## 2014-06-03 VITALS — BP 108/66 | HR 70 | Temp 98.4°F | Wt 130.0 lb

## 2014-06-03 DIAGNOSIS — H5789 Other specified disorders of eye and adnexa: Secondary | ICD-10-CM

## 2014-06-03 DIAGNOSIS — H109 Unspecified conjunctivitis: Secondary | ICD-10-CM

## 2014-06-03 DIAGNOSIS — H579 Unspecified disorder of eye and adnexa: Secondary | ICD-10-CM

## 2014-06-03 NOTE — Progress Notes (Signed)
Pre visit review using our clinic review tool, if applicable. No additional management support is needed unless otherwise documented below in the visit note. 

## 2014-06-03 NOTE — Patient Instructions (Signed)

## 2014-06-03 NOTE — Progress Notes (Signed)
Subjective:    Patient ID: Taylor Bautista, female    DOB: 04-20-1991, 23 y.o.   MRN: [id number]  HPI  Pt presents to the clinic today with c/o left eye irritation. She reports that her left eye feels gritty. She reports this started 4 days ago. She denies crusting or drainage from the eye. She has tried itch relief eye drops without relief. She denies fever, chills or upper respiratory infections. She reports that she did have a niece with pink eye who stayed at her house.   Review of Systems      Past Medical History  Diagnosis Date  . Allergy   . Dysthymic disorder   . Dysmenorrhea   . Exposure to STD   . MRSA infection   . Tobacco use disorder     Current Outpatient Prescriptions  Medication Sig Dispense Refill  . ALPRAZolam (XANAX) 0.5 MG tablet TAKE ONE TABLET BY MOUTH TWICE DAILY AS NEEDED  60 tablet  0  . levonorgestrel (MIRENA) 20 MCG/24HR IUD 1 each by Intrauterine route once. 03/2014      . prenatal vitamin w/FE, FA (PRENATAL 1 + 1) 27-1 MG TABS tablet Take 1 tablet by mouth daily at 12 noon.  30 each  6   No current facility-administered medications for this visit.    No Known Allergies  Family History  Problem Relation Age of Onset  . Cancer Mother 46    one breast  . Cancer Cousin 24    breast  . Anxiety disorder Other   . Cancer Sister 68    lymph node cancer twice    History   Social History  . Marital Status: Single    Spouse Name: N/A    Number of Children: N/A  . Years of Education: N/A   Occupational History  . Not on file.   Social History Main Topics  . Smoking status: Former Smoker    Quit date: 04/13/2012  . Smokeless tobacco: Never Used  . Alcohol Use: Yes     Comment: social drinking  . 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 Sexual Activity: Yes    Partners: Male    Birth Control/ Protection: Injection   Other Topics Concern  . Not on file   Social History Narrative    Lives with mother      Works at [employer]           Constitutional: Denies fever, malaise, fatigue, headache or abrupt weight changes.  HEENT: Pt reports left eye irritation. Denies eye pain, ear pain, ringing in the ears, wax buildup, runny nose, nasal congestion, bloody nose, or sore throat.    No other specific complaints in a complete review of systems (except as listed in HPI above).  Objective:   Physical Exam   BP 108/66  Pulse 70  Temp(Src) 98.4 F (36.9 C) (Oral)  Wt 130 lb (58.968 kg)  SpO2 98% Wt Readings from Last 3 Encounters:  06/03/14 130 lb (58.968 kg)  04/30/14 131 lb (59.421 kg)  04/04/14 130 lb 6.4 oz (59.149 kg)    General: Appears her stated age, well developed, well nourished in NAD. Eyes: sclera white, no icterus, conjunctiva pink, PERRLA and EOMs intact; Ears: Tm's gray and intact, normal light reflex; Nose: mucosa pink and moist, septum midline; Throat/Mouth: Teeth present, mucosa pink and moist, no exudate, lesions or ulcerations noted.  Cardiovascular: Normal rate and  rhythm. S1,S2 noted.  No murmur, rubs or gallops noted.  Pulmonary/Chest: Normal effort and positive vesicular breath sounds. No respiratory distress. No wheezes, rales or ronchi noted.     BMET    Component Value Date/Time   NA 135 09/29/2012 1612   K 3.7 09/29/2012 1612   CL 100 09/29/2012 1612   CO2 23 09/29/2012 1612   GLUCOSE 97 09/29/2012 1612   BUN 13 09/29/2012 1612   CREATININE 0.47* 09/29/2012 1612   CALCIUM 9.3 09/29/2012 1612   GFRNONAA >90 09/29/2012 1612   GFRAA >90 09/29/2012 1612    Lipid Panel  No results found for this basename: chol, trig, hdl, cholhdl, vldl, ldlcalc    CBC    Component Value Date/Time   WBC 6.7 04/12/2013 2350   RBC 3.60* 04/12/2013 2350   HGB 11.4* 04/12/2013 2350   HCT 32.1* 04/12/2013 2350   PLT 198 04/12/2013 2350   MCV 89.2 04/12/2013 2350   MCH 31.7 04/12/2013 2350   MCHC 35.5 04/12/2013 2350   RDW 13.0 04/12/2013 2350    LYMPHSABS 1.1 10/16/2012 1135   MONOABS 0.4 10/16/2012 1135   EOSABS 0.1 10/16/2012 1135   BASOSABS 0.0 10/16/2012 1135    Hgb A1C No results found for this basename: HGBA1C        Assessment & Plan:   Left eye irritation, maybe mild case of conjunctivitis:  Warm compresses TID Saline eye drops to help get rid of gritty feeling Ibuprofen for eye pain Should resolve on its own in a few days  RTC as needed or if symptoms persist or worsen

## 2014-06-03 NOTE — Progress Notes (Signed)
Subjective:    Patient ID: Taylor Bautista, female    DOB: 1991/03/16, 23 y.o.   MRN: [id number]  HPI Patient presents with itchy eye and congestions for the past 4 days.  She has used OTC eye drops without relief.  Denies trauma, discharge, or crusting.     Review of Systems  Past Medical History  Diagnosis Date  . Allergy   . Dysthymic disorder   . Dysmenorrhea   . Exposure to STD   . MRSA infection   . Tobacco use disorder     Current Outpatient Prescriptions  Medication Sig Dispense Refill  . ALPRAZolam (XANAX) 0.5 MG tablet TAKE ONE TABLET BY MOUTH TWICE DAILY AS NEEDED  60 tablet  0  . levonorgestrel (MIRENA) 20 MCG/24HR IUD 1 each by Intrauterine route once. 03/2014      . prenatal vitamin w/FE, FA (PRENATAL 1 + 1) 27-1 MG TABS tablet Take 1 tablet by mouth daily at 12 noon.  30 each  6   No current facility-administered medications for this visit.    No Known Allergies  Family History  Problem Relation Age of Onset  . Cancer Mother 65    one breast  . Cancer Cousin 24    breast  . Anxiety disorder Other   . Cancer Sister 46    lymph node cancer twice    History   Social History  . Marital Status: Single    Spouse Name: N/A    Number of Children: N/A  . Years of Education: N/A   Occupational History  . Not on file.   Social History Main Topics  . Smoking status: Former Smoker    Quit date: 04/13/2012  . Smokeless tobacco: Never Used  . Alcohol Use: Yes     Comment: social drinking  . 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 Sexual Activity: Yes    Partners: Male    Birth Control/ Protection: Injection   Other Topics Concern  . Not on file   Social History Narrative   Lives with mother      Works at [employer]           Constitutional: Denies fever, malaise, fatigue, headache or abrupt weight changes.  HEENT: Denies  ear pain, ringing in the ears, wax buildup, runny nose,  nasal congestion, bloody nose, or sore throat.   No other specific complaints in a complete review of systems (except as listed in HPI above).     Objective:   Physical Exam  BP 108/66  Pulse 70  Temp(Src) 98.4 F (36.9 C) (Oral)  Wt 130 lb (58.968 kg)  SpO2 98% Wt Readings from Last 3 Encounters:  06/03/14 130 lb (58.968 kg)  04/30/14 131 lb (59.421 kg)  04/04/14 130 lb 6.4 oz (59.149 kg)    General: Appears their stated age, well developed, well nourished in NAD. HEENT:Eyes: sclera injected, fundoscopic exam WNL, red reflex present, no foreign body noted. BMET    Component Value Date/Time   NA 135 09/29/2012 1612   K 3.7 09/29/2012 1612   CL 100 09/29/2012 1612   CO2 23 09/29/2012 1612   GLUCOSE 97 09/29/2012 1612   BUN 13 09/29/2012 1612   CREATININE 0.47* 09/29/2012 1612   CALCIUM 9.3 09/29/2012 1612   GFRNONAA >90 09/29/2012 1612   GFRAA >90 09/29/2012 1612    Lipid Panel  No results found for this basename:  chol, trig, hdl, cholhdl, vldl, ldlcalc    CBC    Component Value Date/Time   WBC 6.7 04/12/2013 2350   RBC 3.60* 04/12/2013 2350   HGB 11.4* 04/12/2013 2350   HCT 32.1* 04/12/2013 2350   PLT 198 04/12/2013 2350   MCV 89.2 04/12/2013 2350   MCH 31.7 04/12/2013 2350   MCHC 35.5 04/12/2013 2350   RDW 13.0 04/12/2013 2350   LYMPHSABS 1.1 10/16/2012 1135   MONOABS 0.4 10/16/2012 1135   EOSABS 0.1 10/16/2012 1135   BASOSABS 0.0 10/16/2012 1135    Hgb A1C No results found for this basename: HGBA1C         Assessment & Plan:  Conjunctivitis  Refrain from touching/rubbing eye, use saline eye drops for comfort Please call us if your symptoms worsen or do no improve

## 2014-06-13 ENCOUNTER — Ambulatory Visit: Payer: 59 | Admitting: Internal Medicine

## 2014-06-13 ENCOUNTER — Ambulatory Visit (INDEPENDENT_AMBULATORY_CARE_PROVIDER_SITE_OTHER): Payer: 59 | Admitting: Internal Medicine

## 2014-06-13 ENCOUNTER — Encounter: Payer: Self-pay | Admitting: Internal Medicine

## 2014-06-13 ENCOUNTER — Telehealth: Payer: Self-pay | Admitting: *Deleted

## 2014-06-13 VITALS — BP 108/72 | HR 82 | Temp 98.4°F | Wt 129.0 lb

## 2014-06-13 DIAGNOSIS — M79675 Pain in left toe(s): Secondary | ICD-10-CM

## 2014-06-13 DIAGNOSIS — B379 Candidiasis, unspecified: Secondary | ICD-10-CM

## 2014-06-13 MED ORDER — NAPROXEN SODIUM 550 MG PO TABS: 550.0000 mg | ORAL_TABLET | Freq: Two times a day (BID) | ORAL | Status: DC

## 2014-06-13 MED ORDER — FLUCONAZOLE 150 MG PO TABS: 150.0000 mg | ORAL_TABLET | Freq: Once | ORAL | Status: DC

## 2014-06-13 NOTE — Patient Instructions (Addendum)

## 2014-06-13 NOTE — Progress Notes (Signed)
Pre visit review using our clinic review tool, if applicable. No additional management support is needed unless otherwise documented below in the visit note. 

## 2014-06-13 NOTE — Progress Notes (Signed)
Subjective:    Patient ID: Taylor Bautista, female    DOB: 07/07/1991, 23 y.o.   MRN: [id number]  HPI  Pt presents to the clinic today with c/o left foot pain. She reports this started. The pain is located in the base of her left big toe. She reports that it feels stiff and feels like it is jammed. She has had pain like this before and was seen in the ED. Xray of her foot was normal. She was given Vicodin and advised to follow up with podiatry which she has not done. She reports that the pain has persisted and worsened since her ER visit. She has tried OTC Ibuprofen and ice without any relief.  Review of Systems      Past Medical History  Diagnosis Date  . Allergy   . Dysthymic disorder   . Dysmenorrhea   . Exposure to STD   . MRSA infection   . Tobacco use disorder     Current Outpatient Prescriptions  Medication Sig Dispense Refill  . ALPRAZolam (XANAX) 0.5 MG tablet TAKE ONE TABLET BY MOUTH TWICE DAILY AS NEEDED  60 tablet  0  . levonorgestrel (MIRENA) 20 MCG/24HR IUD 1 each by Intrauterine route once. 03/2014      . prenatal vitamin w/FE, FA (PRENATAL 1 + 1) 27-1 MG TABS tablet Take 1 tablet by mouth daily at 12 noon.  30 each  6   No current facility-administered medications for this visit.    No Known Allergies  Family History  Problem Relation Age of Onset  . Cancer Mother 9339    one breast  . Cancer Cousin 24    breast  . Anxiety disorder Other   . Cancer Sister 723    lymph node cancer twice    History   Social History  . Marital Status: Single    Spouse Name: N/A    Number of Children: N/A  . Years of Education: N/A   Occupational History  . Not on file.   Social History Main Topics  . Smoking status: Former Smoker    Quit date: 04/13/2012  . Smokeless tobacco: Never Used  . Alcohol Use: Yes     Comment: social drinking  . 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 Sexual Activity: Yes   Partners: Male    Birth Control/ Protection: Injection   Other Topics Concern  . Not on file   Social History Narrative   Lives with mother      Works at [employer]           Constitutional: Denies fever, malaise, fatigue, headache or abrupt weight changes.  Musculoskeletal: Pt reports left foot pain. Denies decrease in range of motion, difficulty with gait, muscle pain or joint swelling.     No other specific complaints in a complete review of systems (except as listed in HPI above).  Objective:   Physical Exam   BP 108/72  Pulse 82  Temp(Src) 98.4 F (36.9 C) (Oral)  Wt 129 lb (58.514 kg)  SpO2 99% Wt Readings from Last 3 Encounters:  06/13/14 129 lb (58.514 kg)  06/03/14 130 lb (58.968 kg)  04/30/14 131 lb (59.421 kg)    General: Appears her stated age, well developed, well nourished in NAD. Cardiovascular: Normal rate and rhythm. S1,S2 noted.  No murmur, rubs or gallops noted.  Pulmonary/Chest: Normal effort and positive vesicular breath sounds. No  respiratory distress. No wheezes, rales or ronchi noted.  Musculoskeletal: Decreased flexion of the left big toe due to pain. Normal extension. Pain with palpation under the first MTP joint.  No signs of joint swelling. No difficulty with gait.   BMET    Component Value Date/Time   NA 135 09/29/2012 1612   K 3.7 09/29/2012 1612   CL 100 09/29/2012 1612   CO2 23 09/29/2012 1612   GLUCOSE 97 09/29/2012 1612   BUN 13 09/29/2012 1612   CREATININE 0.47* 09/29/2012 1612   CALCIUM 9.3 09/29/2012 1612   GFRNONAA >90 09/29/2012 1612   GFRAA >90 09/29/2012 1612    Lipid Panel  No results found for this basename: chol, trig, hdl, cholhdl, vldl, ldlcalc    CBC    Component Value Date/Time   WBC 6.7 04/12/2013 2350   RBC 3.60* 04/12/2013 2350   HGB 11.4* 04/12/2013 2350   HCT 32.1* 04/12/2013 2350   PLT 198 04/12/2013 2350   MCV 89.2 04/12/2013 2350   MCH 31.7 04/12/2013 2350   MCHC 35.5 04/12/2013 2350   RDW 13.0  04/12/2013 2350   LYMPHSABS 1.1 10/16/2012 1135   MONOABS 0.4 10/16/2012 1135   EOSABS 0.1 10/16/2012 1135   BASOSABS 0.0 10/16/2012 1135    Hgb A1C No results found for this basename: HGBA1C        Assessment & Plan:   Left toe pain:  No injury or trauma Xray and hospital notes reviewed Will refer to podiatry She did ask for stronger pain medicine like she got in the ER Advised her I would given her Anaprox- not to be taken with Ibuprofen  RTC as needed or if symptoms persist or worsen

## 2014-06-13 NOTE — Telephone Encounter (Signed)
Patient has a yeast infection and would like Diflucan called in.  She will call back for office visit if her symptoms do no resolve.

## 2014-06-28 ENCOUNTER — Other Ambulatory Visit: Payer: Self-pay

## 2014-06-28 MED ORDER — ALPRAZOLAM 0.5 MG PO TABS: ORAL_TABLET | ORAL | Status: DC

## 2014-06-28 NOTE — Telephone Encounter (Signed)
Pt left v/m requesting refill xanax to [facility].Please advise.

## 2014-06-28 NOTE — Telephone Encounter (Signed)
Pt called back on her work break asking to please send this in today. She will run out tomorrow and since we are closed on weekends she is calling it in today.

## 2014-06-28 NOTE — Telephone Encounter (Signed)
Phoned in.  Attempted to call patient, mailbox has not been set up.

## 2014-07-03 ENCOUNTER — Encounter: Payer: Self-pay | Admitting: Family Medicine

## 2014-07-03 ENCOUNTER — Ambulatory Visit (INDEPENDENT_AMBULATORY_CARE_PROVIDER_SITE_OTHER): Payer: 59 | Admitting: Family Medicine

## 2014-07-03 VITALS — BP 108/68 | HR 80 | Temp 98.0°F | Wt 131.0 lb

## 2014-07-03 DIAGNOSIS — R059 Cough, unspecified: Secondary | ICD-10-CM | POA: Insufficient documentation

## 2014-07-03 DIAGNOSIS — R05 Cough: Secondary | ICD-10-CM

## 2014-07-03 MED ORDER — HYDROCODONE-HOMATROPINE 5-1.5 MG/5ML PO SYRP: 5.0000 mL | ORAL_SOLUTION | Freq: Three times a day (TID) | ORAL | Status: DC | PRN

## 2014-07-03 MED ORDER — BENZONATATE 200 MG PO CAPS: 200.0000 mg | ORAL_CAPSULE | Freq: Three times a day (TID) | ORAL | Status: DC | PRN

## 2014-07-03 NOTE — Progress Notes (Signed)
Pre visit review using our clinic review tool, if applicable. No additional management support is needed unless otherwise documented below in the visit note.  duration of symptoms: about 6 days.  Rhinorrhea: yes Congestion: yes ear pain: no sore throat: yes, worse in the last 24 hours Cough: yes Myalgias: some, mild other concerns: initially lost her voice, voice is some better in the meantime  Cough is clearly worse in the last 24 hours.  Chest is sore from coughing. Some sputum, occ.   Sick child at home.    ROS: See HPI.  Otherwise negative.    Meds, vitals, and allergies reviewed.   GEN: nad, alert and oriented HEENT: mucous membranes moist, TM w/o erythema, nasal epithelium injected, OP with cobblestoning NECK: supple w/o LA, no stridor CV: rrr. PULM: ctab, no inc wob, no wheeze, no rhonchi.  ABD: soft, +bs EXT: no edema

## 2014-07-03 NOTE — Assessment & Plan Note (Addendum)
D/w pt.  Nontoxic.  Use tessalon and then hycodan if needed for cough.  Okay for outpatient f/u.  Likely viral. Notify me if not better.  She agrees.  No need/indication for abx at this point.

## 2014-07-03 NOTE — Patient Instructions (Signed)
Use tessalon (pills) during the day and hydrocodone (syrup) at night.   Take care.  Try to get some rest. Drink plenty of fluids.

## 2014-07-10 ENCOUNTER — Ambulatory Visit (INDEPENDENT_AMBULATORY_CARE_PROVIDER_SITE_OTHER): Payer: 59 | Admitting: Family Medicine

## 2014-07-10 ENCOUNTER — Encounter: Payer: Self-pay | Admitting: Family Medicine

## 2014-07-10 VITALS — BP 100/64 | HR 82 | Temp 98.3°F | Wt 133.0 lb

## 2014-07-10 DIAGNOSIS — R05 Cough: Secondary | ICD-10-CM

## 2014-07-10 DIAGNOSIS — R059 Cough, unspecified: Secondary | ICD-10-CM

## 2014-07-10 MED ORDER — DOXYCYCLINE HYCLATE 100 MG PO TABS: 100.0000 mg | ORAL_TABLET | Freq: Two times a day (BID) | ORAL | Status: DC

## 2014-07-10 MED ORDER — LORATADINE 10 MG PO TABS: 10.0000 mg | ORAL_TABLET | Freq: Every day | ORAL | Status: DC

## 2014-07-10 MED ORDER — HYDROCODONE-HOMATROPINE 5-1.5 MG/5ML PO SYRP: 5.0000 mL | ORAL_SOLUTION | Freq: Three times a day (TID) | ORAL | Status: DC | PRN

## 2014-07-10 NOTE — Assessment & Plan Note (Signed)
2 weeks, now with purulent sputum.  Start doxy, continue hycodan prn and f/u prn.  She agrees.  Can use claritin for the itchy eyes.  Nontoxic.

## 2014-07-10 NOTE — Progress Notes (Signed)
Pre visit review using our clinic review tool, if applicable. No additional management support is needed unless otherwise documented below in the visit note.  Cough continues.  ST is some better from before but the L ear pain got worse in the meantime. Eyes have been itching.  Chest is more sore, more coughing.  Pharmacy was out of the tessalon.  Hydrocodone helped some, had used that up in the meantime.  Fatigued.  No fevers.  Some sputum.  Usually yellow/greenish mucous.  Using visine in the meantime for her itchy eyes, w/o much help.    Meds, vitals, and allergies reviewed.   ROS: See HPI.  Otherwise, noncontributory.  GEN: nad, alert and oriented HEENT: mucous membranes moist, tm w/o erythema, nasal exam w/o erythema, clear discharge noted,  OP with cobblestoning, B conjunctival irritation that is mild.   NECK: supple w/o LA CV: rrr.   PULM: cough noted but ctab, no inc wob EXT: no edema SKIN: no acute rash

## 2014-07-10 NOTE — Patient Instructions (Addendum)
Hycodan for the cough, sedation caution.  Drink plenty of fluids.  Start doxycycline today.  Take claritin for the itch eyes.

## 2014-07-15 ENCOUNTER — Encounter: Payer: Self-pay | Admitting: Family Medicine

## 2014-07-30 ENCOUNTER — Telehealth: Payer: Self-pay | Admitting: Internal Medicine

## 2014-07-30 ENCOUNTER — Encounter: Payer: Self-pay | Admitting: Internal Medicine

## 2014-07-30 ENCOUNTER — Other Ambulatory Visit: Payer: Self-pay

## 2014-07-30 MED ORDER — ALPRAZOLAM 0.5 MG PO TABS: ORAL_TABLET | ORAL | Status: DC

## 2014-07-30 NOTE — Telephone Encounter (Signed)
She should have had a drug screen. Needs one before she picks up RX

## 2014-07-30 NOTE — Telephone Encounter (Signed)
Pt left v/m requesting refill alprazolam to [facility].Please advise.

## 2014-07-30 NOTE — Telephone Encounter (Signed)
Patient is calling to find out if her rx is going to be refilled.  Please call patient.

## 2014-07-30 NOTE — Telephone Encounter (Signed)
Per Carrie Mewobin Hayes, pt is aware Rx is ready for pick up

## 2014-07-31 NOTE — Telephone Encounter (Signed)
Pt has picked up Rx

## 2014-08-02 ENCOUNTER — Telehealth: Payer: Self-pay | Admitting: Internal Medicine

## 2014-08-02 NOTE — Telephone Encounter (Signed)
Called pt but was unable lmovm because it has not been set up yet

## 2014-08-02 NOTE — Telephone Encounter (Signed)
Called pt re: her psychiatry referral, she wanted to know why she is at high risk for meds.  Pt requests a call back [phone] / [phone]

## 2014-08-07 ENCOUNTER — Telehealth: Payer: Self-pay

## 2014-08-07 NOTE — Telephone Encounter (Signed)
Per Sharmaine Baseegina Baity---last UDS was negative for Xanax which has been prescribed and positive for Ritalin for which patient is not prescribed  Pt states she has been taking medication--patient expressed understanding that Rene KocherRegina will no longer prescribe any controlled substances

## 2014-08-13 ENCOUNTER — Encounter: Payer: Self-pay | Admitting: Internal Medicine

## 2014-08-22 ENCOUNTER — Telehealth: Payer: Self-pay | Admitting: Internal Medicine

## 2014-08-22 NOTE — Telephone Encounter (Signed)
Pt called and was very upset because she had been told we would not prescribe her controlled substances anymore and that she had Ritalin in her system.  Pt states she called a lawyer and had a blood test that showed she did not have any Ritalin in her system.  I called Richardson Doppole with [facility] and he spoke with a toxicologist there.  The toxicologist said that it showed up positive for Methylphenadate because her result was 4.3.  This test zeros out at 4.0 for Methylphenadate.  The toxicologist said that 4.3 was so close to 4.0 that it was actually negative for Ritalin.  I spoke to the patient about her not taking Hydrocodone and Xanax.  She said that she does not take Hydrocodone and that it was prescribed to her once for a very sore throat and she couldn't sleep because it was so sore.  She said that she does not take Xanax every single day and that is why it was not in her system.  She only uses it when she has a panic attack.  She said she uses [facility] when she gets her medication filled.    She would like to know if you will reconsider prescribing her Xanax when she needs it or if you will give her the name of someone else she can see to get her medication.

## 2014-08-22 NOTE — Telephone Encounter (Signed)
Pt is aware as instructed and states she is okay with a referral

## 2014-08-22 NOTE — Telephone Encounter (Signed)
I will not refill her controlled substances. I would be happy to refer her to psychiatry who could give her the medication she needs

## 2014-09-09 ENCOUNTER — Other Ambulatory Visit: Payer: Self-pay | Admitting: *Deleted

## 2014-09-09 DIAGNOSIS — B379 Candidiasis, unspecified: Secondary | ICD-10-CM

## 2014-09-09 MED ORDER — FLUCONAZOLE 150 MG PO TABS: 150.0000 mg | ORAL_TABLET | Freq: Once | ORAL | Status: DC

## 2014-09-09 NOTE — Telephone Encounter (Signed)
Pt walked in and needed something sent in for a yeast infection.  I have sent in Diflucan to patients pharmacy.

## 2014-09-10 ENCOUNTER — Telehealth: Payer: Self-pay | Admitting: *Deleted

## 2014-09-10 DIAGNOSIS — N760 Acute vaginitis: Principal | ICD-10-CM

## 2014-09-10 DIAGNOSIS — B9689 Other specified bacterial agents as the cause of diseases classified elsewhere: Secondary | ICD-10-CM

## 2014-09-10 MED ORDER — METRONIDAZOLE 500 MG PO TABS: 500.0000 mg | ORAL_TABLET | Freq: Two times a day (BID) | ORAL | Status: DC

## 2014-09-10 NOTE — Telephone Encounter (Signed)
Patient walked in and said she thinks she has a bacterial infection.  I called in Diflucan for her yesterday because she thought it was a yeast infection.  Today she said she has a fishy smell and discharge.  I will send in Flagyl.

## 2014-09-13 HISTORY — PX: FOOT SURGERY: SHX648

## 2014-10-22 ENCOUNTER — Encounter: Payer: Self-pay | Admitting: Family Medicine

## 2014-10-22 ENCOUNTER — Ambulatory Visit (INDEPENDENT_AMBULATORY_CARE_PROVIDER_SITE_OTHER): Payer: 59 | Admitting: Family Medicine

## 2014-10-22 VITALS — BP 102/82 | HR 104 | Ht 66.0 in | Wt 123.2 lb

## 2014-10-22 DIAGNOSIS — Z30431 Encounter for routine checking of intrauterine contraceptive device: Secondary | ICD-10-CM

## 2014-10-22 NOTE — Progress Notes (Signed)
    Subjective:    Patient ID: Taylor Bautista is a 24 y.o. female presenting with iud check  on 10/22/2014  HPI: IUD placed 7/15.  No cycle since then.  Bleeding now x 7 days. Here for IUD string check.  Review of Systems  Constitutional: Negative for fever and chills.  Respiratory: Negative for shortness of breath.   Cardiovascular: Negative for chest pain.  Gastrointestinal: Negative for nausea, vomiting and abdominal pain.  Genitourinary: Positive for vaginal bleeding. Negative for dysuria.  Skin: Negative for rash.      Objective:    BP 102/82 mmHg  Pulse 104  Ht 5\' 6"  (1.676 m)  Wt 123 lb 3.2 oz (55.883 kg)  BMI 19.89 kg/m2  LMP 10/15/2014 (Exact Date)  Breastfeeding? No Physical Exam  Constitutional: She is oriented to person, place, and time. She appears well-developed and well-nourished. No distress.  HENT:  Head: Normocephalic and atraumatic.  Eyes: No scleral icterus.  Neck: Neck supple.  Cardiovascular: Normal rate.   Pulmonary/Chest: Effort normal.  Abdominal: Soft.  Genitourinary:  IUD stings are in normal position.  Neurological: She is alert and oriented to person, place, and time.  Skin: Skin is warm and dry.  Psychiatric: She has a normal mood and affect.   Abdominal U/S shows IUD in correct placement.     Assessment & Plan:  IUD check up  In place--return if does not stop bleeding.  Return in about 3 months (around 01/20/2015).

## 2014-10-22 NOTE — Patient Instructions (Signed)
Intrauterine Device Information An intrauterine device (IUD) is inserted into your uterus to prevent pregnancy. There are two types of IUDs available:   Copper IUD--This type of IUD is wrapped in copper wire and is placed inside the uterus. Copper makes the uterus and fallopian tubes produce a fluid that kills sperm. The copper IUD can stay in place for 10 years.  Hormone IUD--This type of IUD contains the hormone progestin (synthetic progesterone). The hormone thickens the cervical mucus and prevents sperm from entering the uterus. It also thins the uterine lining to prevent implantation of a fertilized egg. The hormone can weaken or kill the sperm that get into the uterus. One type of hormone IUD can stay in place for 5 years, and another type can stay in place for 3 years. Your health care provider will make sure you are a good candidate for a contraceptive IUD. Discuss with your health care provider the possible side effects.  ADVANTAGES OF AN INTRAUTERINE DEVICE  IUDs are highly effective, reversible, long acting, and low maintenance.   There are no estrogen-related side effects.   An IUD can be used when breastfeeding.   IUDs are not associated with weight gain.   The copper IUD works immediately after insertion.   The hormone IUD works right away if inserted within 7 days of your period starting. You will need to use a backup method of birth control for 7 days if the hormone IUD is inserted at any other time in your cycle.  The copper IUD does not interfere with your female hormones.   The hormone IUD can make heavy menstrual periods lighter and decrease cramping.   The hormone IUD can be used for 3 or 5 years.   The copper IUD can be used for 10 years. DISADVANTAGES OF AN INTRAUTERINE DEVICE  The hormone IUD can be associated with irregular bleeding patterns.   The copper IUD can make your menstrual flow heavier and more painful.   You may experience cramping and  vaginal bleeding after insertion.  Document Released: 08/03/2004 Document Revised: 05/02/2013 Document Reviewed: 02/18/2013 Exit[employer] Patient Information 2015 [employer]. This information is not intended to replace advice given to you by your health care provider. Make sure you discuss any questions you have with your health care provider.  

## 2014-10-22 NOTE — Progress Notes (Signed)
Patient has had Mirena IUD since July of last year and has not had any bleeding until last week, she has been bleeding like a heavy period for 7 days now and was concerned the IUD was not in place.  Ultrasound shows IUD in the endometrial cavity in the correct position.

## 2014-10-31 ENCOUNTER — Encounter: Payer: Self-pay | Admitting: Internal Medicine

## 2014-10-31 ENCOUNTER — Ambulatory Visit (INDEPENDENT_AMBULATORY_CARE_PROVIDER_SITE_OTHER): Payer: 59 | Admitting: Internal Medicine

## 2014-10-31 VITALS — BP 112/74 | HR 76 | Temp 98.1°F | Wt 122.0 lb

## 2014-10-31 DIAGNOSIS — R0982 Postnasal drip: Secondary | ICD-10-CM

## 2014-10-31 DIAGNOSIS — J029 Acute pharyngitis, unspecified: Secondary | ICD-10-CM | POA: Diagnosis not present

## 2014-10-31 DIAGNOSIS — R519 Headache, unspecified: Secondary | ICD-10-CM

## 2014-10-31 DIAGNOSIS — R51 Headache: Secondary | ICD-10-CM | POA: Diagnosis not present

## 2014-10-31 MED ORDER — IBUPROFEN 800 MG PO TABS: 800.0000 mg | ORAL_TABLET | Freq: Three times a day (TID) | ORAL | Status: DC | PRN

## 2014-10-31 NOTE — Progress Notes (Signed)
Pre visit review using our clinic review tool, if applicable. No additional management support is needed unless otherwise documented below in the visit note. 

## 2014-10-31 NOTE — Progress Notes (Signed)
Subjective:    Patient ID: Taylor Bautista, female    DOB: 1991/01/23, 24 y.o.   MRN: [id number]  HPI  Pt presents to the clinic today with c/o a headache. She reports her headache is located in her temporal area. She reports it feels like a throbbing sensation. The pain does not radiate but she does have sensitivity to light and sound. She denies flickering lights, aura, nausea or vomiting. She has tried 800 mg of Ibuprofen without any relief. She also reports of a post nasal drip, sore throat and nasal congestion. She reports this started this morning. She is blowing clear mucous out of her nose. She denies fever, chills or body aches. She has not tried anything OTC. She has no history of allergies or breathing problems. She has not had sick contacts.  Review of Systems      Past Medical History  Diagnosis Date  . Allergy   . Dysthymic disorder   . Dysmenorrhea   . Exposure to STD   . MRSA infection   . Tobacco use disorder     Current Outpatient Prescriptions  Medication Sig Dispense Refill  . ALPRAZolam (XANAX) 0.5 MG tablet TAKE ONE TABLET BY MOUTH TWICE DAILY AS NEEDED 60 tablet 0  . levonorgestrel (MIRENA) 20 MCG/24HR IUD 1 each by Intrauterine route once. 03/2014    . ibuprofen (ADVIL,MOTRIN) 800 MG tablet Take 1 tablet (800 mg total) by mouth every 8 (eight) hours as needed. 30 tablet 0   No current facility-administered medications for this visit.    No Known Allergies  Family History  Problem Relation Age of Onset  . Cancer Mother 77    one breast  . Cancer Cousin 24    breast  . Anxiety disorder Other   . Cancer Sister 72    lymph node cancer twice    History   Social History  . Marital Status: Single    Spouse Name: N/A  . Number of Children: N/A  . Years of Education: N/A   Occupational History  . Not on file.   Social History Main Topics  . Smoking status: Former Smoker    Quit date: 04/13/2012  . Smokeless tobacco: Never Used  . Alcohol Use:  0.0 oz/week    0 Standard drinks or equivalent per week     Comment: social drinking  . 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 Sexual Activity:    Partners: Male    Pharmacist, hospital Protection: IUD   Other Topics Concern  . Not on file   Social History Narrative   Lives with mother      Works at [employer]           Constitutional: Pt reports headache. Denies fever, malaise, fatigue, or abrupt weight changes.  HEENT: Pt reports nasal congestion and sore throat. Denies eye pain, eye redness, ear pain, ringing in the ears, wax buildup, runny nose, bloody nose. Respiratory: Denies difficulty breathing, shortness of breath, cough or sputum production.   Cardiovascular: Denies chest pain, chest tightness, palpitations or swelling in the hands or feet.  Gastrointestinal: Denies abdominal pain, bloating, constipation, diarrhea or blood in the stool.   Neurological: Denies dizziness, difficulty with memory, difficulty with speech or problems with balance and coordination.   No other specific complaints in a complete review of systems (except as listed in HPI above).  Objective:   Physical Exam  BP 112/74 mmHg  Pulse 76  Temp(Src) 98.1 F (36.7 C) (Oral)  Wt 122 lb (55.339 kg)  SpO2 98%  LMP 10/15/2014 (Exact Date) Wt Readings from Last 3 Encounters:  10/31/14 122 lb (55.339 kg)  10/22/14 123 lb 3.2 oz (55.883 kg)  07/10/14 133 lb (60.328 kg)    General: Appears her stated age, well developed, well nourished in NAD. HEENT: Head: normal shape and size, no sinus tenderness noted; Eyes: sclera white, no icterus, conjunctiva pink; Ears: Tm's gray and intact, normal light reflex; Nose: mucosa pink and moist, septum midline; Throat/Mouth: Teeth present, mucosa pink and moist, + PND, no exudate, lesions or ulcerations noted.  Neck: No adenopathy noted. Cardiovascular: Normal rate and rhythm. S1,S2 noted.  No murmur, rubs  or gallops noted.  Pulmonary/Chest: Normal effort and positive vesicular breath sounds. No respiratory distress. No wheezes, rales or ronchi noted.  Neurological: Alert and oriented.   BMET    Component Value Date/Time   NA 135 09/29/2012 1612   K 3.7 09/29/2012 1612   CL 100 09/29/2012 1612   CO2 23 09/29/2012 1612   GLUCOSE 97 09/29/2012 1612   BUN 13 09/29/2012 1612   CREATININE 0.47* 09/29/2012 1612   CALCIUM 9.3 09/29/2012 1612   GFRNONAA >90 09/29/2012 1612   GFRAA >90 09/29/2012 1612    Lipid Panel  No results found for: CHOL, TRIG, HDL, CHOLHDL, VLDL, LDLCALC  CBC    Component Value Date/Time   WBC 6.7 04/12/2013 2350   RBC 3.60* 04/12/2013 2350   HGB 11.4* 04/12/2013 2350   HCT 32.1* 04/12/2013 2350   PLT 198 04/12/2013 2350   MCV 89.2 04/12/2013 2350   MCH 31.7 04/12/2013 2350   MCHC 35.5 04/12/2013 2350   RDW 13.0 04/12/2013 2350   LYMPHSABS 1.1 10/16/2012 1135   MONOABS 0.4 10/16/2012 1135   EOSABS 0.1 10/16/2012 1135   BASOSABS 0.0 10/16/2012 1135    Hgb A1C No results found for: HGBA1C      Assessment & Plan:   Temporal headache:  Will give Toradol 30 mg IM today Ok to continue Ibuprofen TID prn  Sore throat secondary to post nasal drip:  Try zyrtec OTC  RTC as needed or if symptoms persist or worsen

## 2014-10-31 NOTE — Patient Instructions (Signed)

## 2014-11-01 DIAGNOSIS — R51 Headache: Secondary | ICD-10-CM

## 2014-11-01 MED ORDER — KETOROLAC TROMETHAMINE 30 MG/ML IJ SOLN
30.0000 mg | Freq: Once | INTRAMUSCULAR | Status: AC
  Administered 2014-11-01: 30 mg via INTRAMUSCULAR

## 2014-11-01 NOTE — Addendum Note (Signed)
Addended by: Roena MaladyEVONTENNO, Sani Loiseau Y on: 11/01/2014 08:18 AM   Modules accepted: Orders

## 2014-11-04 ENCOUNTER — Telehealth: Payer: Self-pay | Admitting: Internal Medicine

## 2014-11-04 NOTE — Telephone Encounter (Signed)
Pt made appt to come in 11/05/14

## 2014-11-04 NOTE — Telephone Encounter (Signed)
She has to have a follow up appt, we offer extended hours she can come in after work

## 2014-11-04 NOTE — Telephone Encounter (Signed)
Patient was seen on Thursday.  Patient said has congestion,runny nose,migraine ha,st, her mucous is green.  Patient wants to know if an antibiotic can be called in to [facility].

## 2014-11-04 NOTE — Telephone Encounter (Signed)
She would have to be reevaluated

## 2014-11-04 NOTE — Telephone Encounter (Signed)
Please call patient. Patient said she can't take anymore time off of work to come in for another appointment.

## 2014-11-05 ENCOUNTER — Encounter: Payer: Self-pay | Admitting: Internal Medicine

## 2014-11-05 ENCOUNTER — Ambulatory Visit (INDEPENDENT_AMBULATORY_CARE_PROVIDER_SITE_OTHER): Payer: 59 | Admitting: Internal Medicine

## 2014-11-05 VITALS — BP 110/76 | HR 103 | Temp 98.5°F | Wt 121.0 lb

## 2014-11-05 DIAGNOSIS — J0100 Acute maxillary sinusitis, unspecified: Secondary | ICD-10-CM

## 2014-11-05 MED ORDER — AZITHROMYCIN 250 MG PO TABS: ORAL_TABLET | ORAL | Status: DC

## 2014-11-05 NOTE — Patient Instructions (Signed)

## 2014-11-05 NOTE — Progress Notes (Signed)
HPI  Pt presents to the clinic today with c/o headache, nasal congestion, runny nose and sore throat. She reports this started 1 week ago and has progressively gotten worse each day. She is blowing green mucous out of her nose. She denies fever but has had chills and body aches. She is a current smoker. She does have seasonal allergies. She is currently using a saline nasal spray and Tylenol without relief. She has not had sick contacts.   Review of Systems    Past Medical History  Diagnosis Date  . Allergy   . Dysthymic disorder   . Dysmenorrhea   . Exposure to STD   . MRSA infection   . Tobacco use disorder     Family History  Problem Relation Age of Onset  . Cancer Mother 6939    one breast  . Cancer Cousin 24    breast  . Anxiety disorder Other   . Cancer Sister [id number]    lymph node cancer twice    History   Social History  . Marital Status: Single    Spouse Name: N/A  . Number of Children: N/A  . Years of Education: N/A   Occupational History  . Not on file.   Social History Main Topics  . Smoking status: Former Smoker    Quit date: 04/13/2012  . Smokeless tobacco: Never Used  . Alcohol Use: 0.0 oz/week    0 Standard drinks or equivalent per week     Comment: social drinking  . 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 Sexual Activity:    Partners: Male    Pharmacist, hospitalBirth Control/ Protection: IUD   Other Topics Concern  . Not on file   Social History Narrative   Lives with mother      Works at [employer]          No Known Allergies   Constitutional: Positive headache, fatigueDenies fever or abrupt weight changes.  HEENT:  Positive facial pain, nasal congestion, runny nose and sore throat. Denies eye redness, ear pain, ringing in the ears, wax buildup or bloody nose. Respiratory:  Denies cough, difficulty breathing or shortness of breath.  Cardiovascular: Denies chest pain, chest tightness,  palpitations or swelling in the hands or feet.   No other specific complaints in a complete review of systems (except as listed in HPI above).  Objective:  BP 110/76 mmHg  Pulse 103  Temp(Src) 98.5 F (36.9 C) (Oral)  Wt 121 lb (54.885 kg)  SpO2 99%  LMP 10/15/2014 (Exact Date)   General: Appears her stated age, ill appearing in NAD. HEENT: Head: normal shape and size, maxillary sinus tenderness noted;  Ears: Tm's gray and intact, normal light reflex; Nose: mucosa pink and moist, septum midline; Throat/Mouth: + PND. Teeth present, mucosa pink and moist, no exudate noted, no lesions or ulcerations noted.  Neck: No adenopathy noted. Cardiovascular: Tachycardic with normal rhythm. S1,S2 noted.  No murmur, rubs or gallops noted.  Pulmonary/Chest: Normal effort and positive vesicular breath sounds. No respiratory distress. No wheezes, rales or ronchi noted.      Assessment & Plan:   Acute maxillary sinusitis  Can use a Neti Pot which can be purchased from your local drug store. Flonase 2 sprays each nostril for 3 days and then as needed. Azithromax x 5 days  RTC as needed or if symptoms persist.

## 2014-11-05 NOTE — Progress Notes (Signed)
Pre visit review using our clinic review tool, if applicable. No additional management support is needed unless otherwise documented below in the visit note. 

## 2014-11-13 ENCOUNTER — Ambulatory Visit (INDEPENDENT_AMBULATORY_CARE_PROVIDER_SITE_OTHER): Payer: 59

## 2014-11-13 ENCOUNTER — Encounter: Payer: Self-pay | Admitting: Podiatry

## 2014-11-13 ENCOUNTER — Other Ambulatory Visit: Payer: Self-pay | Admitting: Podiatry

## 2014-11-13 ENCOUNTER — Ambulatory Visit (INDEPENDENT_AMBULATORY_CARE_PROVIDER_SITE_OTHER): Payer: 59 | Admitting: Podiatry

## 2014-11-13 VITALS — BP 100/71 | HR 86 | Resp 16

## 2014-11-13 DIAGNOSIS — M79672 Pain in left foot: Secondary | ICD-10-CM

## 2014-11-13 DIAGNOSIS — M779 Enthesopathy, unspecified: Secondary | ICD-10-CM

## 2014-11-13 DIAGNOSIS — M21621 Bunionette of right foot: Secondary | ICD-10-CM

## 2014-11-13 DIAGNOSIS — M205X1 Other deformities of toe(s) (acquired), right foot: Secondary | ICD-10-CM | POA: Diagnosis not present

## 2014-11-13 MED ORDER — MELOXICAM 15 MG PO TABS: 15.0000 mg | ORAL_TABLET | Freq: Every day | ORAL | Status: DC

## 2014-11-13 NOTE — Progress Notes (Signed)
   Subjective:    Patient ID: Taylor Bautista, female    DOB: 02/11/1991, 24 y.o.   MRN: [id number]  HPI Comments: "I have pain on the side of my foot"  Patient c/o aching 1st MPJ left since Feb. 2015. She states that she woke up one morning and foot was swollen and painful. No injury. She has had pain ever since. No home treatment.  Also states that her 5th MPJ right is sore with shoe gear.  Foot Pain Associated symptoms include headaches.      Review of Systems  HENT: Positive for sinus pressure.   Eyes: Positive for itching.  Musculoskeletal: Positive for gait problem.  Neurological: Positive for headaches.  All other systems reviewed and are negative.      Objective:   Physical Exam : I have reviewed her past medical history medications allergies surgery social history and review of systems. Pulses are strongly palpable bilateral. Neurologic sensorium is intact per Semmes-Weinstein monofilament. Deep tendon reflexes are intact bilaterally muscle strength +5 over 5 dorsiflexion plantar flexors and inverters everters all just musculatures intact. Orthopedic evaluation does demonstrate a severely painful tailor's bunion deformity secondary to known fracture fifth metatarsal of the right foot. The overlying neuritis is quite tender on palpation. There is no erythema edema cellulitis drainage or odor associated with this. The contralateral foot does demonstrate pain on end range of motion on dorsiflexion and plantarflexion of the first metatarsophalangeal joint of the left foot. This is elevated and elongated based on her x-rays. It does demonstrate some early*arthritic degenerative changes.        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 Tailor's bunion deformity fifth right.  Plan: Discussed etiology pathology conservative versus surgical therapies. After sterile Betadine skin prep I injected the first  metatarsophalangeal joint of the left foot with dexamethasone and local anesthetic. We discussed the need for surgical correction of the fifth metatarsal with an osteotomy right foot. She would like to have this done and sent as possible. I will follow up with her in 3 weeks for evaluation of the left foot as well as the right and discussed surgical possibility for that right foot. I also wrote her prescription for meloxicam. No narcotics were provided.

## 2014-11-14 ENCOUNTER — Institutional Professional Consult (permissible substitution): Payer: 59 | Admitting: Family Medicine

## 2014-11-25 ENCOUNTER — Encounter: Payer: Self-pay | Admitting: Podiatry

## 2014-11-25 ENCOUNTER — Ambulatory Visit (INDEPENDENT_AMBULATORY_CARE_PROVIDER_SITE_OTHER): Payer: 59 | Admitting: Podiatry

## 2014-11-25 VITALS — BP 112/71 | HR 83 | Resp 16

## 2014-11-25 DIAGNOSIS — M205X1 Other deformities of toe(s) (acquired), right foot: Secondary | ICD-10-CM

## 2014-11-25 DIAGNOSIS — M21621 Bunionette of right foot: Secondary | ICD-10-CM

## 2014-11-25 NOTE — Patient Instructions (Signed)
Pre-Operative Instructions  Congratulations, you have decided to take an important step to improving your quality of life.  You can be assured that the doctors of [facility] will be with you every step of the way.  1. Plan to be at the surgery center/hospital at least 1 (one) hour prior to your scheduled time unless otherwise directed by the surgical center/hospital staff.  You must have a responsible adult accompany you, remain during the surgery and drive you home.  Make sure you have directions to the surgical center/hospital and know how to get there on time. 2. For hospital based surgery you will need to obtain a history and physical form from your family physician within 1 month prior to the date of surgery- we will give you a form for you primary physician.  3. We make every effort to accommodate the date you request for surgery.  There are however, times where surgery dates or times have to be moved.  We will contact you as soon as possible if a change in schedule is required.   4. No Aspirin/Ibuprofen for one week before surgery.  If you are on aspirin, any non-steroidal anti-inflammatory medications (Mobic, Aleve, Ibuprofen) you should stop taking it 7 days prior to your surgery.  You make take Tylenol  For pain prior to surgery.  5. Medications- If you are taking daily heart and blood pressure medications, seizure, reflux, allergy, asthma, anxiety, pain or diabetes medications, make sure the surgery center/hospital is aware before the day of surgery so they may notify you which medications to take or avoid the day of surgery. 6. No food or drink after midnight the night before surgery unless directed otherwise by surgical center/hospital staff. 7. No alcoholic beverages 24 hours prior to surgery.  No smoking 24 hours prior to or 24 hours after surgery. 8. Wear loose pants or shorts- loose enough to fit over bandages, boots, and casts. 9. No slip on shoes, sneakers are best. 10. Bring  your boot with you to the surgery center/hospital.  Also bring crutches or a walker if your physician has prescribed it for you.  If you do not have this equipment, it will be provided for you after surgery. 11. If you have not been contracted by the surgery center/hospital by the day before your surgery, call to confirm the date and time of your surgery. 12. Leave-time from work may vary depending on the type of surgery you have.  Appropriate arrangements should be made prior to surgery with your employer. 13. Prescriptions will be provided immediately following surgery by your doctor.  Have these filled as soon as possible after surgery and take the medication as directed. 14. Remove nail polish on the operative foot. 15. Wash the night before surgery.  The night before surgery wash the foot and leg well with the antibacterial soap provided and water paying special attention to beneath the toenails and in between the toes.  Rinse thoroughly with water and dry well with a towel.  Perform this wash unless told not to do so by your physician.  Enclosed: 1 Ice pack (please put in freezer the night before surgery)   1 Hibiclens skin cleaner   Pre-op Instructions  If you have any questions regarding the instructions, do not hesitate to call our office.  [place]: [address] [place], [place] [address] [phone]  [place]: [address] [place], [place] [address] [phone]  [place]: [address]  [place], [place] [address] [phone]  Dr. Richard   Tuchman DPM, Dr. Norman Regal DPM Dr. Richard Sikora DPM, Dr. M. Todd Hyatt DPM, Dr. Kathryn Egerton DPM 
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
## 2014-11-27 ENCOUNTER — Other Ambulatory Visit: Payer: Self-pay | Admitting: Podiatry

## 2014-11-27 MED ORDER — HYDROCODONE-ACETAMINOPHEN 5-325 MG PO TABS: ORAL_TABLET | ORAL | Status: DC

## 2014-11-27 MED ORDER — CEPHALEXIN 500 MG PO CAPS: 500.0000 mg | ORAL_CAPSULE | Freq: Three times a day (TID) | ORAL | Status: DC

## 2014-11-27 MED ORDER — PROMETHAZINE HCL 25 MG PO TABS: 25.0000 mg | ORAL_TABLET | Freq: Three times a day (TID) | ORAL | Status: DC | PRN

## 2014-11-29 ENCOUNTER — Encounter: Payer: Self-pay | Admitting: Podiatry

## 2014-11-29 DIAGNOSIS — M21541 Acquired clubfoot, right foot: Secondary | ICD-10-CM | POA: Diagnosis not present

## 2014-12-02 ENCOUNTER — Telehealth: Payer: Self-pay | Admitting: *Deleted

## 2014-12-02 MED ORDER — OXYCODONE-ACETAMINOPHEN 10-325 MG PO TABS: 1.0000 | ORAL_TABLET | Freq: Four times a day (QID) | ORAL | Status: DC | PRN

## 2014-12-02 NOTE — Telephone Encounter (Signed)
Patient called stating she had surgery on Friday in a lot of pain, has already loosen ace bandages. She does not think pain medication is working , per dr Al Corpushyatt new prescription for pain, and this will be her last until seen

## 2014-12-04 ENCOUNTER — Encounter: Payer: Self-pay | Admitting: Podiatry

## 2014-12-04 ENCOUNTER — Ambulatory Visit (INDEPENDENT_AMBULATORY_CARE_PROVIDER_SITE_OTHER): Payer: 59

## 2014-12-04 ENCOUNTER — Ambulatory Visit (INDEPENDENT_AMBULATORY_CARE_PROVIDER_SITE_OTHER): Payer: 59 | Admitting: Podiatry

## 2014-12-04 VITALS — BP 112/72 | HR 86 | Resp 16

## 2014-12-04 DIAGNOSIS — M205X1 Other deformities of toe(s) (acquired), right foot: Secondary | ICD-10-CM

## 2014-12-04 DIAGNOSIS — Z9889 Other specified postprocedural states: Secondary | ICD-10-CM

## 2014-12-04 NOTE — Progress Notes (Signed)
She presents today 1 week status post fifth metatarsal osteotomy right foot. She denies fever chills nausea vomiting muscle aches and pains.  Objective: Vital signs are stable she is alert and oriented 3. Pulses are strongly palpable bilateral. Neurologic sensorium is intact. Dry sterile dressing intact was removed demonstrates sutures are intact margins are well coapted overlying the fifth metatarsal right foot. Radiograph confirms a fifth metatarsal osteotomy with screw fixation. This appears to be in good position and healing well.  Assessment: Well-healed surgical foot status post 1 week fifth met osteotomy with screw fixation.  Plan: Redressed today with a dry sterile compressive dressing follow-up with her in 1 week for suture removal.

## 2014-12-04 NOTE — Progress Notes (Signed)
DOS 11/29/2014 5th Metatarsal Osteotomy with screw right foot.

## 2014-12-12 ENCOUNTER — Encounter: Payer: Self-pay | Admitting: Podiatry

## 2014-12-12 ENCOUNTER — Ambulatory Visit (INDEPENDENT_AMBULATORY_CARE_PROVIDER_SITE_OTHER): Payer: 59 | Admitting: Podiatry

## 2014-12-12 VITALS — BP 110/81 | HR 80 | Resp 16

## 2014-12-12 DIAGNOSIS — Z9889 Other specified postprocedural states: Secondary | ICD-10-CM

## 2014-12-12 DIAGNOSIS — M205X1 Other deformities of toe(s) (acquired), right foot: Secondary | ICD-10-CM

## 2014-12-12 MED ORDER — OXYCODONE-ACETAMINOPHEN 10-325 MG PO TABS: 1.0000 | ORAL_TABLET | Freq: Four times a day (QID) | ORAL | Status: DC | PRN

## 2014-12-15 NOTE — Progress Notes (Signed)
Patient ID: Taylor Bautista, female   DOB: 05/29/1991, 24 y.o.   MRN: [id number]  Subjective: 24 year old female presents the office today status post right fifth metatarsal osteotomy. She says that overall she is doing well. She does state that she is having some pain for which the Percocet does help. She states that she feels the area swells which causes more discomfort. She denies any systemic complaints as fevers, chills, nausea, vomiting. No other complaints at this time in no acute changes since last appointment.  Objective: AAO 3, NAD DP/PT pulses palpable, CRT less than 3 seconds Protective sensation appears to be intact with Dorann OuSimms Weinstein monofilament Dressing was clean, dry, intact. Incision on the dorsolateral aspect the right foot as well coapted without any evidence of dehiscence. Suture is intact. There is no surrounding erythema, drainage, increase in warmth, or any other clinical signs of infection. There is mild discomfort directly upon palpation of the surgical site. No other areas of tenderness to bilateral lower extremity's. No other areas of edema, erythema, increase in warmth bilaterally. No open lesions or pre-ulcerative lesions identified bilaterally No pain with calf compression, swelling, warmth, erythema.  Assessment: [age] year old female status post right fifth metatarsal osteotomy  Plan: At today's appointment suture ends were cut. Antibiotic ointment and a Band-Aid was applied. Discussed the patient that she can start shower in 24 hours however there is any problems in the incision to hold off on showering and call the office. Continue with CAM walker. Dispensed compression anklet. Ice and elevation. Follow-up with Dr. Al CorpusHyatt in 2 weeks or sooner if any problems are to arise. In the meantime incurs to call the office with any questions, concerns, change in symptoms.

## 2014-12-18 ENCOUNTER — Ambulatory Visit (INDEPENDENT_AMBULATORY_CARE_PROVIDER_SITE_OTHER): Payer: 59

## 2014-12-18 ENCOUNTER — Encounter: Payer: Self-pay | Admitting: Podiatry

## 2014-12-18 ENCOUNTER — Ambulatory Visit (INDEPENDENT_AMBULATORY_CARE_PROVIDER_SITE_OTHER): Payer: 59 | Admitting: Podiatry

## 2014-12-18 VITALS — BP 114/59 | HR 95 | Resp 16

## 2014-12-18 DIAGNOSIS — M205X1 Other deformities of toe(s) (acquired), right foot: Secondary | ICD-10-CM

## 2014-12-18 DIAGNOSIS — Z9889 Other specified postprocedural states: Secondary | ICD-10-CM

## 2014-12-18 MED ORDER — OXYCODONE-ACETAMINOPHEN 10-325 MG PO TABS: 1.0000 | ORAL_TABLET | Freq: Four times a day (QID) | ORAL | Status: DC | PRN

## 2014-12-18 NOTE — Progress Notes (Signed)
She presents today 3 weeks status post fifth metatarsal osteotomy right foot. She denies fever chills nausea vomiting muscle aches and pains states that the foot is been sore. She continues to wear her boot on a regular basis.  Objective: Vital signs are stable she is alert and oriented 3. It's evident that she's had her nails painted professionally. The lateral aspect of the right foot does not demonstrate erythema edema saline as drainage or odor small suture is retained to the proximal incision site. I removed this without complication and there is no bleeding. Minimal edema saline as drainage or odor.  Assessment: Well-healing surgical foot.  Plan: I dispensed her final dose of narcotic today and placed her in a Darco shoe. Follow up with her in 2 weeks

## 2014-12-25 ENCOUNTER — Encounter: Payer: 59 | Admitting: Podiatry

## 2014-12-26 DIAGNOSIS — F410 Panic disorder [episodic paroxysmal anxiety] without agoraphobia: Secondary | ICD-10-CM | POA: Insufficient documentation

## 2015-01-01 ENCOUNTER — Ambulatory Visit (INDEPENDENT_AMBULATORY_CARE_PROVIDER_SITE_OTHER): Payer: 59

## 2015-01-01 ENCOUNTER — Encounter: Payer: Self-pay | Admitting: Podiatry

## 2015-01-01 ENCOUNTER — Ambulatory Visit (INDEPENDENT_AMBULATORY_CARE_PROVIDER_SITE_OTHER): Payer: 59 | Admitting: Podiatry

## 2015-01-01 DIAGNOSIS — M205X1 Other deformities of toe(s) (acquired), right foot: Secondary | ICD-10-CM

## 2015-01-01 DIAGNOSIS — Z9889 Other specified postprocedural states: Secondary | ICD-10-CM

## 2015-01-01 MED ORDER — OXYCODONE-ACETAMINOPHEN 10-325 MG PO TABS: 1.0000 | ORAL_TABLET | Freq: Four times a day (QID) | ORAL | Status: DC | PRN

## 2015-01-01 NOTE — Progress Notes (Signed)
She presents today one month status post fifth metatarsal osteotomy right foot. She states that she's been on it more than she should've been and she's been using her nor products daily.  Objective: Vital signs are stable she is alert and oriented 3. Moderate edema no erythema saline as drainage or odor of the foot is mildly tender to touch.  Assessment: Well-healing osteotomy fifth right confirmed by radiographs.  Plan: We'll allow her to start trying to get into a soft size shoe refilled her narcotic and I'll follow-up with in 2 weeks.

## 2015-01-08 ENCOUNTER — Encounter: Payer: Self-pay | Admitting: Podiatry

## 2015-01-08 ENCOUNTER — Telehealth: Payer: Self-pay | Admitting: *Deleted

## 2015-01-08 DIAGNOSIS — B9689 Other specified bacterial agents as the cause of diseases classified elsewhere: Secondary | ICD-10-CM

## 2015-01-08 DIAGNOSIS — N760 Acute vaginitis: Principal | ICD-10-CM

## 2015-01-08 MED ORDER — METRONIDAZOLE 500 MG PO TABS: 500.0000 mg | ORAL_TABLET | Freq: Two times a day (BID) | ORAL | Status: DC

## 2015-01-08 NOTE — Telephone Encounter (Signed)
Patient called and needed something called in for bacterial vaginosis.  I have sent in Flagyl to pharmacy.

## 2015-01-15 ENCOUNTER — Ambulatory Visit: Payer: 59 | Admitting: Podiatry

## 2015-01-17 ENCOUNTER — Telehealth: Payer: Self-pay | Admitting: Podiatry

## 2015-01-17 NOTE — Telephone Encounter (Signed)
Needs peroct filled

## 2015-01-29 ENCOUNTER — Ambulatory Visit (INDEPENDENT_AMBULATORY_CARE_PROVIDER_SITE_OTHER): Payer: 59

## 2015-01-29 ENCOUNTER — Ambulatory Visit (INDEPENDENT_AMBULATORY_CARE_PROVIDER_SITE_OTHER): Payer: 59 | Admitting: Podiatry

## 2015-01-29 VITALS — BP 112/70 | HR 82 | Resp 16

## 2015-01-29 DIAGNOSIS — M205X1 Other deformities of toe(s) (acquired), right foot: Secondary | ICD-10-CM

## 2015-01-29 MED ORDER — OXYCODONE-ACETAMINOPHEN 10-325 MG PO TABS: 1.0000 | ORAL_TABLET | Freq: Four times a day (QID) | ORAL | Status: DC | PRN

## 2015-01-29 NOTE — Progress Notes (Signed)
She presents today 2 months status post fifth metatarsal osteotomy right foot with double screw fixation. States it is still tender.  Objective: Vital signs are stable she is alert and oriented 3 presents today in a pair flip-flops. She has mild tenderness on palpation with some swelling to the lateral fifth metatarsal head right foot. Radiographs confirm well-healing osteotomy.  Assessment: Mild swelling status post fifth met osteotomy 2 months right.  Plan: Wrote another prescription for pain medication follow-up with her in 1 month if necessary.

## 2015-02-12 ENCOUNTER — Telehealth: Payer: Self-pay | Admitting: *Deleted

## 2015-02-12 DIAGNOSIS — N760 Acute vaginitis: Principal | ICD-10-CM

## 2015-02-12 DIAGNOSIS — B9689 Other specified bacterial agents as the cause of diseases classified elsewhere: Secondary | ICD-10-CM

## 2015-02-12 MED ORDER — METRONIDAZOLE 500 MG PO TABS: 500.0000 mg | ORAL_TABLET | Freq: Two times a day (BID) | ORAL | Status: DC

## 2015-02-12 NOTE — Telephone Encounter (Signed)
Patient called and thinks she has BV again for the 3rd time in three months.  I am sending in Flagyl.  Patient is going to schedule appt next week to come in and talk to to doctor about her frequent BV.

## 2015-02-20 ENCOUNTER — Encounter: Payer: Self-pay | Admitting: Obstetrics & Gynecology

## 2015-02-20 ENCOUNTER — Ambulatory Visit (INDEPENDENT_AMBULATORY_CARE_PROVIDER_SITE_OTHER): Payer: 59 | Admitting: Obstetrics & Gynecology

## 2015-02-20 VITALS — BP 105/70 | HR 62 | Ht 66.0 in | Wt 124.6 lb

## 2015-02-20 DIAGNOSIS — N898 Other specified noninflammatory disorders of vagina: Secondary | ICD-10-CM | POA: Diagnosis not present

## 2015-02-20 DIAGNOSIS — Z113 Encounter for screening for infections with a predominantly sexual mode of transmission: Secondary | ICD-10-CM | POA: Diagnosis not present

## 2015-02-20 NOTE — Addendum Note (Signed)
Addended by: Barbara Cower on: 02/20/2015 10:51 AM   Modules accepted: Orders

## 2015-02-20 NOTE — Progress Notes (Signed)
   Subjective:    Patient ID: Taylor Bautista, female    DOB: 1991-02-06, 24 y.o.   MRN: [id number]  HPI  24 yo SW P1 here today with a 3 month h/o vaginal discharge associated with a fishy odor. She attributes this to BV and has been treated 3 times recently but no physical exam. She has had a new partner for the last 4 months.  Review of Systems She has had Mirena for about a year and had an occasion of bleeding a few months ago (first time since getting Mirena)    Objective:   Physical Exam  WNWHWFNAD Breathing and ambulating normally EG normal Vagina with a frothy discharge near the cervix IUD strings seen      Assessment & Plan:  Probable BV (versus trich)- BD affirm sent along with cervical cultures Samples of Hylafem given

## 2015-02-20 NOTE — Progress Notes (Signed)
Patient has been having more episodes of BV and would like to be checked out just to be sure everything is ok.  I have discussed probiotic and rephresh use with her to help with this.

## 2015-02-21 LAB — CERVICOVAGINAL ANCILLARY ONLY: Wet Prep (BD Affirm): NEGATIVE

## 2015-02-21 LAB — GC/CHLAMYDIA PROBE AMP
CT PROBE, AMP APTIMA: NEGATIVE
GC Probe RNA: NEGATIVE

## 2015-02-21 LAB — HEPATITIS B SURFACE ANTIGEN: HEP B S AG: NEGATIVE

## 2015-02-21 LAB — HIV ANTIBODY (ROUTINE TESTING W REFLEX): HIV: NONREACTIVE

## 2015-02-21 LAB — HEPATITIS C ANTIBODY: HCV Ab: NEGATIVE

## 2015-02-21 LAB — RPR

## 2015-02-24 ENCOUNTER — Telehealth: Payer: Self-pay | Admitting: *Deleted

## 2015-02-24 ENCOUNTER — Encounter: Payer: 59 | Admitting: Obstetrics & Gynecology

## 2015-02-24 NOTE — Telephone Encounter (Signed)
Patient is notified of negative results.

## 2015-02-26 ENCOUNTER — Ambulatory Visit: Payer: 59 | Admitting: Podiatry

## 2015-02-27 ENCOUNTER — Telehealth: Payer: Self-pay | Admitting: *Deleted

## 2015-02-27 DIAGNOSIS — B379 Candidiasis, unspecified: Secondary | ICD-10-CM

## 2015-02-27 MED ORDER — FLUCONAZOLE 150 MG PO TABS: 150.0000 mg | ORAL_TABLET | Freq: Once | ORAL | Status: DC

## 2015-02-27 NOTE — Telephone Encounter (Signed)
Patient called and needed something called in for yeast. I have sent in Diflucan.

## 2015-03-04 ENCOUNTER — Other Ambulatory Visit (INDEPENDENT_AMBULATORY_CARE_PROVIDER_SITE_OTHER): Payer: 59 | Admitting: *Deleted

## 2015-03-04 DIAGNOSIS — N390 Urinary tract infection, site not specified: Secondary | ICD-10-CM

## 2015-03-04 LAB — POCT URINALYSIS DIPSTICK
Bilirubin, UA: NEGATIVE
Glucose, UA: NEGATIVE
Ketones, UA: NEGATIVE
NITRITE UA: POSITIVE
Protein, UA: NEGATIVE
Spec Grav, UA: 1.025
Urobilinogen, UA: NEGATIVE
pH, UA: 6

## 2015-03-04 MED ORDER — NITROFURANTOIN MONOHYD MACRO 100 MG PO CAPS: 100.0000 mg | ORAL_CAPSULE | Freq: Two times a day (BID) | ORAL | Status: DC

## 2015-03-04 NOTE — Progress Notes (Signed)
Patient walked in and wanted her urine checked for a UTI.  Nitrite positive. Trace Leuk, Trace blood.   Will call in Macrobid for patient.

## 2015-03-06 LAB — URINE CULTURE

## 2015-03-07 MED ORDER — CIPROFLOXACIN HCL 500 MG PO TABS: 500.0000 mg | ORAL_TABLET | Freq: Two times a day (BID) | ORAL | Status: DC

## 2015-03-07 NOTE — Addendum Note (Signed)
Addended by: Jaynie Collins A on: 03/07/2015 12:03 PM   Modules accepted: Orders

## 2015-03-28 ENCOUNTER — Ambulatory Visit (INDEPENDENT_AMBULATORY_CARE_PROVIDER_SITE_OTHER): Payer: 59 | Admitting: Obstetrics & Gynecology

## 2015-03-28 ENCOUNTER — Encounter: Payer: Self-pay | Admitting: Obstetrics & Gynecology

## 2015-03-28 VITALS — BP 102/67 | HR 62 | Ht 66.0 in | Wt 121.0 lb

## 2015-03-28 DIAGNOSIS — Z30432 Encounter for removal of intrauterine contraceptive device: Secondary | ICD-10-CM

## 2015-03-28 DIAGNOSIS — N898 Other specified noninflammatory disorders of vagina: Secondary | ICD-10-CM | POA: Diagnosis not present

## 2015-03-28 DIAGNOSIS — Z3042 Encounter for surveillance of injectable contraceptive: Secondary | ICD-10-CM

## 2015-03-28 MED ORDER — MEDROXYPROGESTERONE ACETATE 150 MG/ML IM SUSP
150.0000 mg | INTRAMUSCULAR | Status: DC
Start: 2015-03-28 — End: 2016-06-24
  Administered 2015-03-28 – 2016-03-24 (×3): 150 mg via INTRAMUSCULAR

## 2015-03-28 NOTE — Progress Notes (Signed)
    GYNECOLOGY CLINIC PROCEDURE NOTE  Taylor BaxterCandice R Bautista is a 24 y.o. [phone] [id number] here for Mirena IUD removal. Noticed yellow vaginal discharge, wants to be evaluated for vaginitis. No itching or other symptoms.  Last pap smear was on 10/20/2012 and was normal.  IUD Removal  Patient identified, informed consent performed, consent signed.  Patient was in the dorsal lithotomy position, normal external genitalia was noted.  A speculum was placed in the patient's vagina, no lesions noted, yellow discharge was noted and wet prep sample obtained.  The cervix was visualized, no lesions, no abnormal discharge.  The strings of the IUD were grasped and pulled using ring forceps. The IUD was removed in its entirety.  Patient tolerated the procedure well.    Patient will use Depo Provera for contraception and this was given today. She was reminded to have backup contraception for one week during this transition period between contraceptives. She will return in 3 months for next Depo Provera injection. Will follow up wet prep and manage accordingly.  Routine preventative health maintenance measures emphasized.    Jaynie CollinsUGONNA  Danell Vazquez, MD, FACOG Attending Obstetrician & Gynecologist Center for [facility], [facility]

## 2015-03-28 NOTE — Progress Notes (Signed)
Here today for IUD removal and does want to start on depo provera.  Feels like she may have a bacterial infection now.

## 2015-03-28 NOTE — Patient Instructions (Signed)
Return to clinic for any obstetric concerns or go to MAU for evaluation  

## 2015-03-29 LAB — WET PREP, GENITAL
TRICH WET PREP: NONE SEEN
YEAST WET PREP: NONE SEEN

## 2015-04-01 ENCOUNTER — Ambulatory Visit: Payer: 59 | Admitting: Obstetrics & Gynecology

## 2015-04-14 ENCOUNTER — Encounter: Payer: Self-pay | Admitting: Obstetrics & Gynecology

## 2015-04-14 ENCOUNTER — Ambulatory Visit (INDEPENDENT_AMBULATORY_CARE_PROVIDER_SITE_OTHER): Payer: 59 | Admitting: Obstetrics & Gynecology

## 2015-04-14 VITALS — BP 107/66 | HR 92 | Ht 66.0 in | Wt 118.0 lb

## 2015-04-14 DIAGNOSIS — R300 Dysuria: Secondary | ICD-10-CM

## 2015-04-14 DIAGNOSIS — N9489 Other specified conditions associated with female genital organs and menstrual cycle: Secondary | ICD-10-CM

## 2015-04-14 DIAGNOSIS — N898 Other specified noninflammatory disorders of vagina: Secondary | ICD-10-CM

## 2015-04-14 LAB — POCT URINALYSIS DIPSTICK
BILIRUBIN UA: NEGATIVE
Blood, UA: NEGATIVE
Glucose, UA: NEGATIVE
Nitrite, UA: NEGATIVE
Protein, UA: NEGATIVE
SPEC GRAV UA: 1.025
Urobilinogen, UA: NEGATIVE
pH, UA: 5

## 2015-04-14 LAB — POCT URINE PREGNANCY: Preg Test, Ur: NEGATIVE

## 2015-04-14 NOTE — Patient Instructions (Signed)

## 2015-04-14 NOTE — Progress Notes (Signed)
Patient ID: Taylor Bautista, female   DOB: 1991-01-02, 24 y.o.   MRN: [id number] History:  24 y.o. [id number] here today for eval of vaginal odor. Pt started probiotics last week.  She reports having her IUD removed 2 weeks ago but the sx continue.      The following portions of the patient's history were reviewed and updated as appropriate: allergies, current medications, past family history, past medical history, past social history, past surgical history and problem list.   Objective:  Physical Exam Blood pressure 107/66, pulse 92, height  (1.676 m), weight 118 lb (53.524 kg), last menstrual period 03/25/2015, not currently breastfeeding. Gen: NAD Abd: Soft, nontender and nondistended Pelvic: Normal appearing external genitalia; normal appearing vaginal mucosa and cervix.  Normal discharge.    Assessment & Plan:  F/u wet mount Reviewed causes of vaginal odor.

## 2015-04-14 NOTE — Progress Notes (Signed)
Having vaginal discharge, with odor.  Some urine frequency.

## 2015-04-15 ENCOUNTER — Telehealth: Payer: Self-pay | Admitting: *Deleted

## 2015-04-15 DIAGNOSIS — N898 Other specified noninflammatory disorders of vagina: Secondary | ICD-10-CM

## 2015-04-15 LAB — WET PREP, GENITAL
TRICH WET PREP: NONE SEEN
Yeast Wet Prep HPF POC: NONE SEEN

## 2015-04-15 MED ORDER — METRONIDAZOLE 0.75 % VA GEL: 1.0000 | Freq: Every day | VAGINAL | Status: DC

## 2015-04-15 NOTE — Telephone Encounter (Signed)
Informed pt of wet prep result, called in Metrogel to pharmacy for recurrent BV.

## 2015-04-16 ENCOUNTER — Other Ambulatory Visit: Payer: Self-pay | Admitting: Obstetrics & Gynecology

## 2015-04-16 DIAGNOSIS — N760 Acute vaginitis: Principal | ICD-10-CM

## 2015-04-16 DIAGNOSIS — B9689 Other specified bacterial agents as the cause of diseases classified elsewhere: Secondary | ICD-10-CM

## 2015-04-16 MED ORDER — METRONIDAZOLE 0.75 % VA GEL: 1.0000 | Freq: Every day | VAGINAL | Status: DC

## 2015-04-28 ENCOUNTER — Encounter (HOSPITAL_COMMUNITY): Payer: Self-pay | Admitting: *Deleted

## 2015-04-28 ENCOUNTER — Emergency Department (HOSPITAL_COMMUNITY)
Admission: EM | Admit: 2015-04-28 | Discharge: 2015-04-29 | Disposition: A | Discharge status: Home / Self Care | Payer: 59 | Attending: Emergency Medicine | Admitting: Emergency Medicine

## 2015-04-28 ENCOUNTER — Emergency Department (HOSPITAL_COMMUNITY): Payer: 59

## 2015-04-28 DIAGNOSIS — Z3202 Encounter for pregnancy test, result negative: Secondary | ICD-10-CM | POA: Diagnosis not present

## 2015-04-28 DIAGNOSIS — J029 Acute pharyngitis, unspecified: Secondary | ICD-10-CM | POA: Diagnosis not present

## 2015-04-28 DIAGNOSIS — Z8659 Personal history of other mental and behavioral disorders: Secondary | ICD-10-CM | POA: Diagnosis not present

## 2015-04-28 DIAGNOSIS — Z87891 Personal history of nicotine dependence: Secondary | ICD-10-CM | POA: Insufficient documentation

## 2015-04-28 DIAGNOSIS — Z8614 Personal history of Methicillin resistant Staphylococcus aureus infection: Secondary | ICD-10-CM | POA: Diagnosis not present

## 2015-04-28 DIAGNOSIS — R079 Chest pain, unspecified: Secondary | ICD-10-CM | POA: Diagnosis present

## 2015-04-28 DIAGNOSIS — Z8742 Personal history of other diseases of the female genital tract: Secondary | ICD-10-CM | POA: Diagnosis not present

## 2015-04-28 LAB — CBC
HCT: 38.8 % (ref 36.0–46.0)
Hemoglobin: 13.5 g/dL (ref 12.0–15.0)
MCH: 31 pg (ref 26.0–34.0)
MCHC: 34.8 g/dL (ref 30.0–36.0)
MCV: 89.2 fL (ref 78.0–100.0)
PLATELETS: 231 10*3/uL (ref 150–400)
RBC: 4.35 MIL/uL (ref 3.87–5.11)
RDW: 12.2 % (ref 11.5–15.5)
WBC: 10.1 10*3/uL (ref 4.0–10.5)

## 2015-04-28 LAB — BASIC METABOLIC PANEL
Anion gap: 10 (ref 5–15)
BUN: 12 mg/dL (ref 6–20)
CO2: 26 mmol/L (ref 22–32)
CREATININE: 0.72 mg/dL (ref 0.44–1.00)
Calcium: 9.7 mg/dL (ref 8.9–10.3)
Chloride: 103 mmol/L (ref 101–111)
GLUCOSE: 123 mg/dL — AB (ref 65–99)
Potassium: 3.2 mmol/L — ABNORMAL LOW (ref 3.5–5.1)
Sodium: 139 mmol/L (ref 135–145)

## 2015-04-28 LAB — I-STAT BETA HCG BLOOD, ED (MC, WL, AP ONLY): I-stat hCG, quantitative: <5 m[IU]/mL (ref <5)

## 2015-04-28 NOTE — ED Notes (Signed)
Pt. States unable to void at the moment. 

## 2015-04-28 NOTE — ED Notes (Signed)
Patient transported to X-ray 

## 2015-04-28 NOTE — ED Notes (Signed)
Patient stated she was cooking dinner about 3pm and started with CP and some SOB

## 2015-04-29 ENCOUNTER — Encounter (HOSPITAL_COMMUNITY): Payer: Self-pay | Admitting: Emergency Medicine

## 2015-04-29 ENCOUNTER — Other Ambulatory Visit: Payer: Self-pay

## 2015-04-29 DIAGNOSIS — F410 Panic disorder [episodic paroxysmal anxiety] without agoraphobia: Secondary | ICD-10-CM

## 2015-04-29 LAB — URINALYSIS, ROUTINE W REFLEX MICROSCOPIC
Bilirubin Urine: NEGATIVE
GLUCOSE, UA: NEGATIVE mg/dL
HGB URINE DIPSTICK: NEGATIVE
KETONES UR: NEGATIVE mg/dL
Leukocytes, UA: NEGATIVE
Nitrite: NEGATIVE
PROTEIN: NEGATIVE mg/dL
Specific Gravity, Urine: 1.012 (ref 1.005–1.030)
Urobilinogen, UA: 0.2 mg/dL (ref 0.0–1.0)
pH: 8 (ref 5.0–8.0)

## 2015-04-29 LAB — POC URINE PREG, ED: Preg Test, Ur: NEGATIVE

## 2015-04-29 LAB — RAPID STREP SCREEN (MED CTR MEBANE ONLY): Streptococcus, Group A Screen (Direct): NEGATIVE

## 2015-04-29 MED ORDER — GI COCKTAIL ~~LOC~~
30.0000 mL | Freq: Once | ORAL | Status: AC
  Administered 2015-04-29: 30 mL via ORAL
  Filled 2015-04-29: qty 30

## 2015-04-29 MED ORDER — NAPROXEN 375 MG PO TABS: 375.0000 mg | ORAL_TABLET | Freq: Two times a day (BID) | ORAL | Status: DC

## 2015-04-29 MED ORDER — METHOCARBAMOL 500 MG PO TABS
1000.0000 mg | ORAL_TABLET | Freq: Once | ORAL | Status: AC
  Administered 2015-04-29: 1000 mg via ORAL
  Filled 2015-04-29: qty 2

## 2015-04-29 NOTE — ED Provider Notes (Signed)
CSN: [id number]     Arrival date & time 04/28/15  2254 History  This chart was scribed for Taylor Farrugia, MD by Doreatha Martin, ED Scribe. This patient was seen in room D30C/D30C and the patient's care was started at 12:45 AM.     Chief Complaint  Patient presents with  . Chest Pain  . Shortness of Breath   Patient is a 24 y.o. female presenting with pharyngitis. The history is provided by the patient. No language interpreter was used.  Sore Throat The current episode started 6 to 12 hours ago. The problem occurs constantly. The problem has not changed since onset.Pertinent negatives include no chest pain and no abdominal pain. Nothing aggravates the symptoms. Nothing relieves the symptoms. She has tried nothing for the symptoms. The treatment provided no relief.    HPI Comments: Taylor Bautista is a 24 y.o. female who presents to the Emergency Department complaining of moderate sore throat onset 10 hours PTA while cooking. She states associated itchy, watery eyes. No treatments tried PTA. No sick contact at home, no unusual foods. She denies fever, cough, wheezing, abdominal pain.    Past Medical History  Diagnosis Date  . Allergy   . Dysthymic disorder   . Dysmenorrhea   . Exposure to STD   . MRSA infection   . Tobacco use disorder    Past Surgical History  Procedure Laterality Date  . Pigmented nevus removal  04/1989   Family History  Problem Relation Age of Onset  . Cancer Mother 55    one breast  . Cancer Cousin 24    breast  . Anxiety disorder Other   . Cancer Sister 88    lymph node cancer twice   Social History  Substance Use Topics  . Smoking status: Former Smoker    Quit date: 04/13/2012  . Smokeless tobacco: Never Used  . Alcohol Use: 0.0 oz/week    0 Standard drinks or equivalent per week     Comment: social drinking   OB History    Gravida Para Term Preterm AB TAB SAB Ectopic Multiple Living   3 2 1 1 1 1  0 0 0 2     Review of Systems  Constitutional:  Negative for fever.  HENT: Positive for sore throat. Negative for drooling, trouble swallowing and voice change.   Eyes: Positive for itching.  Respiratory: Negative for cough and wheezing.   Cardiovascular: Negative for chest pain, palpitations and leg swelling.  Gastrointestinal: Negative for abdominal pain.  All other systems reviewed and are negative.  Allergies  Review of patient's allergies indicates no known allergies.  Home Medications   Prior to Admission medications   Medication Sig Start Date End Date Taking? Authorizing Provider  ALPRAZolam Prudy Feeler) 0.5 MG tablet TAKE ONE TABLET BY MOUTH TWICE DAILY AS NEEDED Patient not taking: Reported on 04/29/2015 07/30/14   Lorre Munroe, NP  ibuprofen (ADVIL,MOTRIN) 800 MG tablet Take 1 tablet (800 mg total) by mouth every 8 (eight) hours as needed. Patient not taking: Reported on 04/29/2015 10/31/14   Lorre Munroe, NP  metroNIDAZOLE (METROGEL) 0.75 % vaginal gel Place 1 Applicatorful vaginally at bedtime. Apply one applicatorful to vagina at bedtime for 5 days Patient not taking: Reported on 04/29/2015 04/16/15   Willodean Rosenthal, MD   BP 108/63 mmHg  Pulse 69  Resp 14  SpO2 99% Physical Exam  Constitutional: She is oriented to person, place, and time. She appears well-developed and well-nourished. No distress.  HENT:  Head: Normocephalic and atraumatic.  Mouth/Throat: Oropharynx is clear and moist. No uvula swelling. No oropharyngeal exudate.  Tonsils normal. No swelling of the lips, tongue or uvula. No swelling of the lymph nodes.   Eyes: Conjunctivae and EOM are normal. Pupils are equal, round, and reactive to light.  Neck: Normal range of motion. Neck supple.  Intact phonation no pain with displacement of the trachea  Cardiovascular: Normal rate, regular rhythm, normal heart sounds and intact distal pulses.   Pulmonary/Chest: Effort normal and breath sounds normal. No respiratory distress. She has no wheezes. She has no  rales. She exhibits no tenderness.  Lungs CTA  Abdominal: Soft. Bowel sounds are normal. She exhibits no distension. There is no tenderness.  Musculoskeletal: Normal range of motion.  Lymphadenopathy:    She has no cervical adenopathy.  Neurological: She is alert and oriented to person, place, and time.  Skin: Skin is warm and dry.  Psychiatric: She has a normal mood and affect. Her behavior is normal.  Nursing note and vitals reviewed.   ED Course  Procedures (including critical care time) DIAGNOSTIC STUDIES: Oxygen Saturation is 99% on RA, normal by my interpretation.    COORDINATION OF CARE: 12:47 AM Discussed treatment plan with pt at bedside and pt agreed to plan.   Labs Review Labs Reviewed  BASIC METABOLIC PANEL - Abnormal; Notable for the following:    Potassium 3.2 (*)    Glucose, Bld 123 (*)    All other components within normal limits  RAPID STREP SCREEN (NOT AT [facility])  CULTURE, GROUP A STREP  CBC  URINALYSIS, ROUTINE W REFLEX MICROSCOPIC (NOT AT [facility])  POC URINE PREG, ED  I-STAT BETA HCG BLOOD, ED ([facility], [facility], [facility] ONLY)    Imaging Review Dg Chest 2 View  04/29/2015   CLINICAL DATA:  Acute onset of generalized chest pain and shortness of breath. Initial encounter.  EXAM: CHEST  2 VIEW  COMPARISON:  Chest radiograph performed 04/09/2012  FINDINGS: The lungs are well-aerated and clear. There is no evidence of focal opacification, pleural effusion or pneumothorax.  The heart is normal in size; the mediastinal contour is within normal limits. No acute osseous abnormalities are seen.  IMPRESSION: No acute cardiopulmonary process seen.   Electronically Signed   By: Roanna Raider M.D.   On: 04/29/2015 00:19   I,, personally reviewed and evaluated these images and lab results as part of my medical decision-making.   EKG Interpretation   Date/Time:  Monday April 28 2015 22:56:20 EDT Ventricular Rate:  72 PR Interval:  124 QRS Duration: 86 QT Interval:  364 QTC  Calculation: 398 R Axis:   89 Text Interpretation:  Sinus rhythm with marked sinus arrhythmia \\E \  Confirmed by [facility]  MD, Elfreida Heggs ([id number]) on 04/29/2015 12:42:49 AM     MDM   Final diagnoses:  None   Based on Cent[facility] criteria there is no indication for further testing or treatment  Will treat with ibuprofen for presumed viral pharyngitis.  Strict return precautions given  I personally performed the services described in this documentation, which was scribed in my presence. The recorded information has been reviewed and is accurate.    Cy Blamer, MD 04/29/15 [phone]

## 2015-04-29 NOTE — ED Notes (Signed)
Pt being picked up by a friend.  Able to dress self and ambulate in hall.  Gait steady and even.

## 2015-04-29 NOTE — Telephone Encounter (Signed)
received  a fax requesting a refill on alprazolam  .  pt last seen on 01-30-15 next appt is 05-07-15

## 2015-04-30 ENCOUNTER — Ambulatory Visit: Payer: Self-pay | Admitting: Psychiatry

## 2015-05-01 ENCOUNTER — Other Ambulatory Visit: Payer: Self-pay

## 2015-05-01 DIAGNOSIS — F410 Panic disorder [episodic paroxysmal anxiety] without agoraphobia: Secondary | ICD-10-CM

## 2015-05-01 LAB — CULTURE, GROUP A STREP: Strep A Culture: NEGATIVE

## 2015-05-01 MED ORDER — ALPRAZOLAM 0.5 MG PO TABS: ORAL_TABLET | ORAL | Status: DC

## 2015-05-01 MED ORDER — ALPRAZOLAM 1 MG PO TABS: 1.0000 mg | ORAL_TABLET | Freq: Two times a day (BID) | ORAL | Status: DC | PRN

## 2015-05-01 NOTE — Telephone Encounter (Signed)
spoke with pt. rx was sent to [facility]. for  and month supply. pt needs to keep appt for the 05-07-15

## 2015-05-01 NOTE — Telephone Encounter (Signed)
faxed rx- confirmed 

## 2015-05-01 NOTE — Telephone Encounter (Signed)
pt called states two things: 1st her xanax was increase to  not .  and 2nd she states that she needs a whole month supply because she got to pay the same if she get one tablet or it she gets a month supply she states she not going to pay for less than a month supply of pills she doesn't have the money.  Pt appt was original for the 04-30-15 but that the doctor was not going to be in the office so it was moved to the 05-07-15.

## 2015-05-01 NOTE — Telephone Encounter (Signed)
faxed rx to pharmacy. - confirmed 

## 2015-05-07 ENCOUNTER — Ambulatory Visit (INDEPENDENT_AMBULATORY_CARE_PROVIDER_SITE_OTHER): Payer: 59 | Admitting: Psychiatry

## 2015-05-07 ENCOUNTER — Encounter: Payer: Self-pay | Admitting: Psychiatry

## 2015-05-07 VITALS — BP 118/68 | HR 93 | Temp 97.6°F | Ht 67.0 in | Wt 118.8 lb

## 2015-05-07 DIAGNOSIS — F410 Panic disorder [episodic paroxysmal anxiety] without agoraphobia: Secondary | ICD-10-CM

## 2015-05-07 MED ORDER — ALPRAZOLAM 1 MG PO TABS: ORAL_TABLET | ORAL | Status: DC

## 2015-05-07 NOTE — Progress Notes (Signed)
BH MD/PA/NP OP Progress Note  05/07/2015 11:59 AM Taylor Bautista  MRN:  [id number]  Subjective:  Patient returns for follow-up of her panic disorder without agoraphobia. She states overall things continue to do well. 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 She states she works from 5 AM to 5 PM. She she still typically very tired but has trouble sleeping. She states she finds that the Xanax has been effective for helping her go to sleep. We did discuss other sleep aids and sleep hygiene. Her sleep hygiene review was negative and she states she's tried other sleep medications such as Ambien, Vistaril but they've all left her too sedated in the morning. I did discuss that if she is having anxiety and stress that it might be useful to get involved with some counseling or therapy. She stated due to her obligations to care for her kids she does not want to pursue this at this time. I also discussed the use of SSRIs however patient states she is adamantly against that.   Chief Complaint:  Chief Complaint    Follow-up; Medication Refill     Visit Diagnosis:     ICD-9-CM ICD-10-CM   1. Panic disorder 300.01 F41.0 ALPRAZolam (XANAX) 1 MG tablet    Past Medical History:  Past Medical History  Diagnosis Date  . Allergy   . Dysthymic disorder   . Dysmenorrhea   . Exposure to STD   . MRSA infection   . Tobacco use disorder     Past Surgical History  Procedure Laterality Date  . Pigmented nevus removal  04/1989   Family History:  Family History  Problem Relation Age of Onset  . Cancer Mother 49    one breast  . Cancer Cousin 24    breast  . Anxiety disorder Other   . Cancer Sister 46    lymph node cancer twice  . Anxiety disorder Sister   . Depression Sister    Social History:  Social History   Social History  . Marital Status: Single    Spouse Name: N/A  . Number of Children: N/A  .  Years of Education: N/A   Social History Main Topics  . Smoking status: Former Smoker    Quit date: 04/13/2012  . Smokeless tobacco: Never Used  . Alcohol Use: 0.6 oz/week    0 Standard drinks or equivalent, 0 Glasses of wine, 1 Cans of beer, 0 Shots of liquor per week     Comment: social drinking  . 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 Sexual Activity:    Partners: Male    Pharmacist, hospital Protection: IUD   Other Topics Concern  . None   Social History Narrative   Lives with mother      Works at [employer]         Additional History:   Assessment:   Musculoskeletal: Strength & Muscle Tone: within normal limits Gait & Station: normal Patient leans: N/A  Psychiatric Specialty Exam: HPI  Review of Systems  Psychiatric/Behavioral: Negative for depression, suicidal ideas, hallucinations, memory loss and substance abuse. The patient is nervous/anxious and has insomnia (Responding to medications).     Blood pressure 118/68, pulse 93, temperature 97.6 F (36.4 C), temperature source Tympanic, height 5\' 7"  (1.702 m), weight 118 lb 12.8 oz (  53.887 kg), SpO2 96 %.Body mass index is 18.6 kg/(m^2).  General Appearance: Neat and Well Groomed  Eye Contact:  Good  Speech:  Normal Rate  Volume:  Normal  Mood:  Good  Affect:  Bright, smiling  Thought Process:  Linear and Logical  Orientation:  Full (Time, Place, and Person)  Thought Content:  Negative  Suicidal Thoughts:  No  Homicidal Thoughts:  No  Memory:  Immediate;   Good Recent;   Good Remote;   Good  Judgement:  Good  Insight:  Good  Psychomotor Activity:  Negative  Concentration:  Good  Recall:  Good  Fund of Knowledge: Good  Language: Good  Akathisia:  Negative  Handed:  Right unknown  AIMS (if indicated):  N/A  Assets:  Manufacturing systems engineer Physical Health Vocational/Educational  ADL's:  Intact  Cognition: WNL  Sleep:  Good with medication    Is the patient at risk to self?  No. Has the patient been a risk to self in the past 6 months?  No. Has the patient been a risk to self within the distant past?  No. Is the patient a risk to others?  No. Has the patient been a risk to others in the past 6 months?  No. Has the patient been a risk to others within the distant past?  No.  Current Medications: Current Outpatient Prescriptions  Medication Sig Dispense Refill  . ALPRAZolam (XANAX) 1 MG tablet Take 1 tablet (1 mg total) by mouth 2 (two) times daily as needed for anxiety. TAKE ONE TABLET BY MOUTH TWICE DAILY AS NEEDED 60 tablet 0  . ibuprofen (ADVIL,MOTRIN) 800 MG tablet Take 1 tablet (800 mg total) by mouth every 8 (eight) hours as needed. 30 tablet 0  . metroNIDAZOLE (METROGEL) 0.75 % vaginal gel Place 1 Applicatorful vaginally at bedtime. Apply one applicatorful to vagina at bedtime for 5 days 70 g 1  . ALPRAZolam (XANAX) 1 MG tablet One tablet in the morning and two tablets in the evening as needed for anxiety. (Patient not taking: Reported on 05/07/2015) 90 tablet 2  . naproxen (NAPROSYN) 375 MG tablet Take 1 tablet (375 mg total) by mouth 2 (two) times daily. (Patient not taking: Reported on 05/07/2015) 14 tablet 0   Current Facility-Administered Medications  Medication Dose Route Frequency Provider Last Rate Last Dose  . medroxyPROGESTERone (DEPO-PROVERA) injection 150 mg  150 mg Intramuscular Q90 days Tereso Newcomer, MD   150 mg at 03/28/15 1042    Medical Decision Making:  Established Problem, Stable/Improving (1) and Review of Medication Regimen & Side Effects (2)  Treatment Plan Summary:Medication management and Plan Patient has been stable on her alprazolam. We are going to increase her dose so that she can address increased anxiety in the evening. Thus we'll increase her dose to alprazolam 1 mg 3 times a day as needed. I have encouraged patient to engage in other avenues as well to address anxiety such as therapy and  continue to consider trials of SSRIs. However patient is comparable with the current plan. She has been functioning well and is stable on this current regimen at this time.   Wallace Going 05/07/2015, 11:59 AM

## 2015-05-09 ENCOUNTER — Ambulatory Visit (INDEPENDENT_AMBULATORY_CARE_PROVIDER_SITE_OTHER): Payer: 59 | Admitting: Obstetrics & Gynecology

## 2015-05-09 VITALS — BP 102/72 | HR 77 | Ht 66.0 in | Wt 118.0 lb

## 2015-05-09 DIAGNOSIS — Z113 Encounter for screening for infections with a predominantly sexual mode of transmission: Secondary | ICD-10-CM

## 2015-05-09 DIAGNOSIS — Z7251 High risk heterosexual behavior: Secondary | ICD-10-CM

## 2015-05-09 DIAGNOSIS — Z0000 Encounter for general adult medical examination without abnormal findings: Secondary | ICD-10-CM

## 2015-05-09 LAB — RPR

## 2015-05-09 NOTE — Addendum Note (Signed)
Addended by: Pennie Banter on: 05/09/2015 10:42 AM   Modules accepted: Orders

## 2015-05-09 NOTE — Progress Notes (Signed)
Here today for full STI screening.

## 2015-05-09 NOTE — Progress Notes (Signed)
   Subjective:    Patient ID: Taylor Bautista, female    DOB: 04-05-1991, 24 y.o.   MRN: [id number]  HPI  24 yo SW P1 heard through the grapevine that her boyfriend has been cheating. She would like full STI testing. She denies any vaginal discharge at this time, but does have some internal vaginal "burning".  Review of Systems     Objective:   Physical Exam [id number] Breathing, conversing, and ambulating normally Abd- benign EG, vagina, cervix all normal. No discharge noted.       Assessment & Plan:  Possible exposure to STI- testing, inc HSV per patient request

## 2015-05-10 LAB — GC/CHLAMYDIA PROBE AMP, URINE
Chlamydia, Swab/Urine, PCR: NEGATIVE
GC Probe Amp, Urine: NEGATIVE

## 2015-05-10 LAB — HEPATITIS C ANTIBODY: HCV Ab: NEGATIVE

## 2015-05-10 LAB — HIV ANTIBODY (ROUTINE TESTING W REFLEX): HIV 1&2 Ab, 4th Generation: NONREACTIVE

## 2015-05-10 LAB — HEPATITIS B SURFACE ANTIGEN: Hepatitis B Surface Ag: NEGATIVE

## 2015-05-12 ENCOUNTER — Ambulatory Visit: Payer: 59 | Admitting: Obstetrics & Gynecology

## 2015-05-13 LAB — HSV 2 ANTIBODY, IGG

## 2015-06-09 ENCOUNTER — Other Ambulatory Visit (INDEPENDENT_AMBULATORY_CARE_PROVIDER_SITE_OTHER): Payer: 59 | Admitting: *Deleted

## 2015-06-09 DIAGNOSIS — R300 Dysuria: Secondary | ICD-10-CM

## 2015-06-09 LAB — POCT URINALYSIS DIPSTICK
BILIRUBIN UA: NEGATIVE
Glucose, UA: NEGATIVE
Ketones, UA: NEGATIVE
NITRITE UA: POSITIVE
PH UA: 5
Spec Grav, UA: 1.025
UROBILINOGEN UA: 0.2

## 2015-06-09 MED ORDER — SULFAMETHOXAZOLE-TRIMETHOPRIM 800-160 MG PO TABS: 1.0000 | ORAL_TABLET | Freq: Two times a day (BID) | ORAL | Status: DC

## 2015-06-09 MED ORDER — PHENAZOPYRIDINE HCL 200 MG PO TABS: 200.0000 mg | ORAL_TABLET | Freq: Three times a day (TID) | ORAL | Status: DC | PRN

## 2015-06-09 NOTE — Progress Notes (Signed)
Pt c/o burning with urination for a few days.  Urinalysis + nitrites and trace blood, will send urine cx.  Sent rx for Septra DS and Pyridium per protocol.  Instructed on use, will call pt when results of urine cx are available.

## 2015-06-11 LAB — URINE CULTURE: Colony Count: 90000

## 2015-06-16 ENCOUNTER — Ambulatory Visit: Payer: 59 | Admitting: Obstetrics & Gynecology

## 2015-06-16 DIAGNOSIS — B373 Candidiasis of vulva and vagina: Secondary | ICD-10-CM

## 2015-07-02 ENCOUNTER — Ambulatory Visit: Payer: 59 | Admitting: Advanced Practice Midwife

## 2015-07-02 DIAGNOSIS — Z3009 Encounter for other general counseling and advice on contraception: Secondary | ICD-10-CM

## 2015-07-25 ENCOUNTER — Ambulatory Visit: Payer: 59 | Admitting: Family Medicine

## 2015-07-25 ENCOUNTER — Ambulatory Visit (INDEPENDENT_AMBULATORY_CARE_PROVIDER_SITE_OTHER): Payer: 59 | Admitting: *Deleted

## 2015-07-25 DIAGNOSIS — Z3202 Encounter for pregnancy test, result negative: Secondary | ICD-10-CM

## 2015-07-25 DIAGNOSIS — Z3042 Encounter for surveillance of injectable contraceptive: Secondary | ICD-10-CM

## 2015-07-25 LAB — POCT URINE PREGNANCY: Preg Test, Ur: NEGATIVE

## 2015-07-25 MED ORDER — MEDROXYPROGESTERONE ACETATE 150 MG/ML IM SUSP
150.0000 mg | Freq: Once | INTRAMUSCULAR | Status: AC
  Administered 2015-07-25: 150 mg via INTRAMUSCULAR

## 2015-07-25 NOTE — Progress Notes (Signed)
Patient here today for a Depo Provera injection.  UPT negative today in office.

## 2015-07-28 ENCOUNTER — Telehealth: Payer: Self-pay | Admitting: *Deleted

## 2015-07-28 DIAGNOSIS — N898 Other specified noninflammatory disorders of vagina: Secondary | ICD-10-CM

## 2015-07-28 MED ORDER — FLUCONAZOLE 150 MG PO TABS: 150.0000 mg | ORAL_TABLET | Freq: Once | ORAL | Status: DC

## 2015-07-28 NOTE — Telephone Encounter (Signed)
Pt called c/o white vaginal discharge and itching and now experiencing some burning with urination believed to be irritation from yeast infection.  Sent rx to pharmacy for Diflucan, instructed on use.  If burning with urination gets worse or continues after treatment with Diflucan pt will come in for UA and urine cx.

## 2015-08-01 ENCOUNTER — Other Ambulatory Visit (INDEPENDENT_AMBULATORY_CARE_PROVIDER_SITE_OTHER): Payer: 59 | Admitting: *Deleted

## 2015-08-01 DIAGNOSIS — R300 Dysuria: Secondary | ICD-10-CM

## 2015-08-01 LAB — POCT URINALYSIS DIPSTICK
GLUCOSE UA: NEGATIVE
Ketones, UA: NEGATIVE
Leukocytes, UA: NEGATIVE
NITRITE UA: NEGATIVE
PH UA: 5
PROTEIN UA: NEGATIVE
SPEC GRAV UA: 1.02
UROBILINOGEN UA: 0.2

## 2015-08-01 NOTE — Progress Notes (Signed)
Pt came in office c/o burning with urination.  UA showed slight trace of blood.  Will send urine cx for verification.  Instructed to take the Pyridium for relief and will call her on Monday with urine cx results.  Encouraged to push fluids, pt acknowledged.

## 2015-08-03 LAB — URINE CULTURE
Colony Count: NO GROWTH
Organism ID, Bacteria: NO GROWTH

## 2015-08-06 ENCOUNTER — Ambulatory Visit: Payer: 59 | Admitting: Psychiatry

## 2015-08-19 ENCOUNTER — Encounter: Payer: Self-pay | Admitting: Psychiatry

## 2015-08-19 ENCOUNTER — Ambulatory Visit (INDEPENDENT_AMBULATORY_CARE_PROVIDER_SITE_OTHER): Payer: 59 | Admitting: Psychiatry

## 2015-08-19 VITALS — BP 120/70 | HR 91 | Temp 97.7°F | Ht 66.0 in | Wt 121.6 lb

## 2015-08-19 DIAGNOSIS — F410 Panic disorder [episodic paroxysmal anxiety] without agoraphobia: Secondary | ICD-10-CM | POA: Diagnosis not present

## 2015-08-19 MED ORDER — ALPRAZOLAM 1 MG PO TABS: ORAL_TABLET | ORAL | Status: DC

## 2015-08-19 NOTE — Progress Notes (Signed)
[facility] MD/PA/NP OP Progress Note  08/19/2015 10:23 AM Taylor Bautista  MRN:  [id number]  Subjective:  Patient returns for follow-up of her panic disorder without agoraphobia. She states right now the medication continues to work well for her anxiety. She states the biggest stressor right now is her work schedule. She states given the holiday season [employer] as increased her work schedule to 7 days a week. She states that the only day she will have off this month will be Christmas day. She indicates she has some support from her son's father as they have 50-50 custody. She states that she expects that the schedule and workload will decrease in early January.  Chief Complaint:  Chief Complaint    Follow-up; Medication Refill     Visit Diagnosis:     ICD-9-CM ICD-10-CM   1. Panic disorder 300.01 F41.0 ALPRAZolam (XANAX) 1 MG tablet    Past Medical History:  Past Medical History  Diagnosis Date  . Allergy   . Dysthymic disorder   . Dysmenorrhea   . Exposure to STD   . MRSA infection   . Tobacco use disorder     Past Surgical History  Procedure Laterality Date  . Pigmented nevus removal  04/1989   Family History:  Family History  Problem Relation Age of Onset  . Cancer Mother 55    one breast  . Cancer Cousin 24    breast  . Anxiety disorder Other   . Cancer Sister 34    lymph node cancer twice  . Anxiety disorder Sister   . Depression Sister    Social History:  Social History   Social History  . Marital Status: Single    Spouse Name: N/A  . Number of Children: N/A  . Years of Education: N/A   Social History Main Topics  . Smoking status: Former Smoker    Quit date: 04/13/2012  . Smokeless tobacco: Never Used  . Alcohol Use: 0.6 oz/week    0 Standard drinks or equivalent, 0 Glasses of wine, 1 Cans of beer, 0 Shots of liquor per week     Comment: social drinking  . 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 Sexual Activity:    Partners: Male    Pharmacist, hospital Protection: IUD   Other Topics Concern  . None   Social History Narrative   Lives with mother      Works at [employer]         Additional History:   Assessment:   Musculoskeletal: Strength & Muscle Tone: within normal limits Gait & Station: normal Patient leans: N/A  Psychiatric Specialty Exam: HPI  Review of Systems  Psychiatric/Behavioral: Negative for depression, suicidal ideas, hallucinations, memory loss and substance abuse. The patient is nervous/anxious and has insomnia (Responding to medications).     Blood pressure 120/70, pulse 91, temperature 97.7 F (36.5 C), temperature source Tympanic, height  (1.676 m), weight 121 lb 9.6 oz (55.157 kg), SpO2 98 %.Body mass index is 19.64 kg/(m^2).  General Appearance: Neat and Well Groomed  Eye Contact:  Good  Speech:  Normal Rate  Volume:  Normal  Mood:  Good  Affect:  Bright, smiling  Thought Process:  Linear and Logical  Orientation:  Full (Time, Place, and Person)  Thought Content:  Negative  Suicidal Thoughts:  No  Homicidal Thoughts:  No  Memory:  Immediate;   Good Recent;  Good Remote;   Good  Judgement:  Good  Insight:  Good  Psychomotor Activity:  Negative  Concentration:  Good  Recall:  Good  Fund of Knowledge: Good  Language: Good  Akathisia:  Negative  Handed:  Right unknown  AIMS (if indicated):  N/A  Assets:  Manufacturing systems engineerCommunication Skills Physical Health Vocational/Educational  ADL's:  Intact  Cognition: WNL  Sleep:  Good with medication   Is the patient at risk to self?  No. Has the patient been a risk to self in the past 6 months?  No. Has the patient been a risk to self within the distant past?  No. Is the patient a risk to others?  No. Has the patient been a risk to others in the past 6 months?  No. Has the patient been a risk to others within the distant past?  No.  Current Medications: Current Outpatient Prescriptions   Medication Sig Dispense Refill  . metroNIDAZOLE (METROGEL) 0.75 % vaginal gel Place 1 Applicatorful vaginally at bedtime. Apply one applicatorful to vagina at bedtime for 5 days 70 g 1  . naproxen (NAPROSYN) 375 MG tablet Take 1 tablet (375 mg total) by mouth 2 (two) times daily. 14 tablet 0  . phenazopyridine (PYRIDIUM) 200 MG tablet Take 1 tablet (200 mg total) by mouth 3 (three) times daily as needed for pain (urethral spasm). 10 tablet 0  . ALPRAZolam (XANAX) 1 MG tablet One tablet in the morning and two tablets in the evening as needed for anxiety. 90 tablet 2   Current Facility-Administered Medications  Medication Dose Route Frequency Provider Last Rate Last Dose  . medroxyPROGESTERone (DEPO-PROVERA) injection 150 mg  150 mg Intramuscular Q90 days Tereso NewcomerUgonna A Anyanwu, MD   150 mg at 03/28/15 1042    Medical Decision Making:  Established Problem, Stable/Improving (1) and Review of Medication Regimen & Side Effects (2)  Treatment Plan Summary:Medication management and Plan    Disorder without agoraphobia Patient has been stable on her alprazolam. Continue alprazolam 1 mg QAm and 2mg  Qhs. I have encouraged patient to engage in other avenues as well to address anxiety such as therapy and continue to consider trials of SSRIs.  She has been functioning well and is stable on this current regimen at this time.  Discussed with patient my plans to depart the practice in February. Discussed with her that there will be arrangements for her to continue with another practitioner in the practice.  Follow-up in 3 months.  Wallace Goinglton Tnya Ades 08/19/2015, 10:23 AM

## 2015-09-10 NOTE — Progress Notes (Signed)
Duplicate/error

## 2015-10-10 ENCOUNTER — Ambulatory Visit: Payer: 59

## 2015-10-13 ENCOUNTER — Other Ambulatory Visit (INDEPENDENT_AMBULATORY_CARE_PROVIDER_SITE_OTHER): Payer: 59 | Admitting: *Deleted

## 2015-10-13 DIAGNOSIS — Z3042 Encounter for surveillance of injectable contraceptive: Secondary | ICD-10-CM

## 2015-10-13 DIAGNOSIS — M549 Dorsalgia, unspecified: Secondary | ICD-10-CM | POA: Diagnosis not present

## 2015-10-13 LAB — POCT URINALYSIS DIPSTICK
BILIRUBIN UA: NEGATIVE
GLUCOSE UA: NEGATIVE
Ketones, UA: NEGATIVE
Leukocytes, UA: NEGATIVE
Nitrite, UA: NEGATIVE
PH UA: 5
Protein, UA: NEGATIVE
RBC UA: NEGATIVE
SPEC GRAV UA: 1.025
UROBILINOGEN UA: NEGATIVE

## 2015-10-13 MED ORDER — MEDROXYPROGESTERONE ACETATE 150 MG/ML IM SUSP
150.0000 mg | Freq: Once | INTRAMUSCULAR | Status: AC
  Administered 2015-10-13: 150 mg via INTRAMUSCULAR

## 2015-10-13 NOTE — Progress Notes (Signed)
Patient here today for Depo Provera injection.  Also did a urine dip on patient because of back pain, dip clear.

## 2015-11-17 ENCOUNTER — Other Ambulatory Visit: Payer: Self-pay

## 2015-11-17 ENCOUNTER — Ambulatory Visit: Payer: 59 | Admitting: Psychiatry

## 2015-11-17 DIAGNOSIS — F410 Panic disorder [episodic paroxysmal anxiety] without agoraphobia: Secondary | ICD-10-CM

## 2015-11-17 MED ORDER — ALPRAZOLAM 1 MG PO TABS: ORAL_TABLET | ORAL | Status: DC

## 2015-11-17 NOTE — Telephone Encounter (Signed)
rx was faxed and confirmed for xanax  1 mg order # [id number] id # [id number]

## 2015-11-17 NOTE — Telephone Encounter (Signed)
pt did have an appt today at 6911 but she was stuck in traffic and so she had to r/s appt appt is r/s for 12-02-15.  pt needs enough medication to get to appt.

## 2015-12-02 ENCOUNTER — Encounter: Payer: Self-pay | Admitting: Psychiatry

## 2015-12-02 ENCOUNTER — Ambulatory Visit (INDEPENDENT_AMBULATORY_CARE_PROVIDER_SITE_OTHER): Payer: 59 | Admitting: Psychiatry

## 2015-12-02 VITALS — BP 108/60 | HR 88 | Temp 97.7°F | Ht 66.0 in | Wt 130.8 lb

## 2015-12-02 DIAGNOSIS — F410 Panic disorder [episodic paroxysmal anxiety] without agoraphobia: Secondary | ICD-10-CM | POA: Diagnosis not present

## 2015-12-02 DIAGNOSIS — F331 Major depressive disorder, recurrent, moderate: Secondary | ICD-10-CM

## 2015-12-02 MED ORDER — ALPRAZOLAM 0.5 MG PO TABS: 0.5000 mg | ORAL_TABLET | Freq: Three times a day (TID) | ORAL | Status: DC | PRN

## 2015-12-02 NOTE — Progress Notes (Signed)
Patient ID: Taylor Bautista, female   DOB: 12/18/1990, 25 y.o.   MRN: [id number] [facility] MD/PA/NP OP Progress Note  12/02/2015 11:04 AM Taylor Bautista  MRN:  [id number]  Subjective:  Patient returns for follow-up of her panic disorder without agoraphobia. Patient was previously seen by Dr. Mayford Knife. This is the first visit for this patient with this clinician. Per patient and Dr. Mayford Knife notes patient has not wanted to be started on any SSRIs for her anxiety. States that the Xanax works well for her and she wants to continue on that. She is currently taking 2 mg of Xanax at bedtime and 1 mg in the morning. States that she will not do want to change her therapy. It was discussed with her that this clinician does not prescribe Xanax benzodiazepine as a primary treatment. Patient understands it and will find another provider. Also discussed with patient tapering her Xanax to 1-1/2 mg daily to be taken as 0.5 mg 3 times daily and 1 month supply will be provided. Patient is okay with this and would look for a new clinician.   Chief Complaint:  Chief Complaint    Follow-up; Medication Refill     Visit Diagnosis:   Panic disorder  Past Medical History:  Past Medical History  Diagnosis Date  . Allergy   . Dysthymic disorder   . Dysmenorrhea   . Exposure to STD   . MRSA infection   . Tobacco use disorder     Past Surgical History  Procedure Laterality Date  . Pigmented nevus removal  04/1989   Family History:  Family History  Problem Relation Age of Onset  . Cancer Mother 56    one breast  . Cancer Cousin 24    breast  . Anxiety disorder Other   . Cancer Sister 61    lymph node cancer twice  . Anxiety disorder Sister   . Depression Sister    Social History:  Social History   Social History  . Marital Status: Single    Spouse Name: N/A  . Number of Children: N/A  . Years of Education: N/A   Social History Main Topics  . Smoking status: Former Smoker    Quit date: 04/13/2012  .  Smokeless tobacco: Never Used  . Alcohol Use: 0.6 oz/week    0 Standard drinks or equivalent, 0 Glasses of wine, 1 Cans of beer, 0 Shots of liquor per week     Comment: social drinking  . 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 Sexual Activity:    Partners: Male    Pharmacist, hospital Protection: IUD   Other Topics Concern  . None   Social History Narrative   Lives with mother      Works at [employer]         Additional History:   Assessment:   Musculoskeletal: Strength & Muscle Tone: within normal limits Gait & Station: normal Patient leans: N/A  Psychiatric Specialty Exam: HPI  Review of Systems  Psychiatric/Behavioral: Negative for depression, suicidal ideas, hallucinations, memory loss and substance abuse. The patient is nervous/anxious and has insomnia (Responding to medications).     Blood pressure 108/60, pulse 88, temperature 97.7 F (36.5 C), temperature source Tympanic, height  (1.676 m), weight 130 lb 12.8 oz (59.33 kg), SpO2 97 %.Body mass index is 21.12 kg/(m^2).  General Appearance: Neat and Well Groomed  Eye Contact:  Good  Speech:  Normal Rate  Volume:  Normal  Mood:  Good  Affect:  Bright, smiling  Thought Process:  Linear and Logical  Orientation:  Full (Time, Place, and Person)  Thought Content:  Negative  Suicidal Thoughts:  No  Homicidal Thoughts:  No  Memory:  Immediate;   Good Recent;   Good Remote;   Good  Judgement:  Good  Insight:  Good  Psychomotor Activity:  Negative  Concentration:  Good  Recall:  Good  Fund of Knowledge: Good  Language: Good  Akathisia:  Negative  Handed:  Right unknown  AIMS (if indicated):  N/A  Assets:  Manufacturing systems engineerCommunication Skills Physical Health Vocational/Educational  ADL's:  Intact  Cognition: WNL  Sleep:  Good with medication   Is the patient at risk to self?  No. Has the patient been a risk to self in the past 6 months?  No. Has the patient  been a risk to self within the distant past?  No. Is the patient a risk to others?  No. Has the patient been a risk to others in the past 6 months?  No. Has the patient been a risk to others within the distant past?  No.  Current Medications: Current Outpatient Prescriptions  Medication Sig Dispense Refill  . ALPRAZolam (XANAX) 1 MG tablet One tablet in the morning and two tablets in the evening as needed for anxiety. 48 tablet 0  . metroNIDAZOLE (METROGEL) 0.75 % vaginal gel Place 1 Applicatorful vaginally at bedtime. Apply one applicatorful to vagina at bedtime for 5 days 70 g 1  . naproxen (NAPROSYN) 375 MG tablet Take 1 tablet (375 mg total) by mouth 2 (two) times daily. (Patient not taking: Reported on 12/02/2015) 14 tablet 0  . phenazopyridine (PYRIDIUM) 200 MG tablet Take 1 tablet (200 mg total) by mouth 3 (three) times daily as needed for pain (urethral spasm). (Patient not taking: Reported on 12/02/2015) 10 tablet 0   Current Facility-Administered Medications  Medication Dose Route Frequency Provider Last Rate Last Dose  . medroxyPROGESTERone (DEPO-PROVERA) injection 150 mg  150 mg Intramuscular Q90 days Tereso NewcomerUgonna A Anyanwu, MD   150 mg at 03/28/15 1042    Medical Decision Making:  Established Problem, Stable/Improving (1) and Review of Medication Regimen & Side Effects (2)  Treatment Plan Summary:Medication management and Plan    Panic Disorder without agoraphobia - discussed different options of treatment for anxiety and panic symptoms. Patient is not interested in giving a trial off SSRIs. States that the Xanax works well for her. She was made aware that this clinician does not prescribe benzodiazepines as a primary treatment. She is willing to look for another clinician. Patient was given a prescription for 90 tablets off Xanax at 0.5 mg to be taken as twice daily and taper off to once daily after 2 weeks. Patient will find a new clinician in the interim.  Jeffry Vogelsang,  Gao Mitnick 12/02/2015, 11:04 AM

## 2015-12-22 ENCOUNTER — Ambulatory Visit (INDEPENDENT_AMBULATORY_CARE_PROVIDER_SITE_OTHER): Payer: 59 | Admitting: Primary Care

## 2015-12-22 ENCOUNTER — Encounter: Payer: Self-pay | Admitting: Primary Care

## 2015-12-22 VITALS — BP 110/64 | HR 90 | Temp 97.9°F | Ht 66.0 in | Wt 131.8 lb

## 2015-12-22 DIAGNOSIS — H1013 Acute atopic conjunctivitis, bilateral: Secondary | ICD-10-CM

## 2015-12-22 MED ORDER — OLOPATADINE HCL 0.1 % OP SOLN: 1.0000 [drp] | Freq: Two times a day (BID) | OPHTHALMIC | Status: DC

## 2015-12-22 NOTE — Patient Instructions (Addendum)
Start olopatadine eye drops for allergies to your eyes. Instill 1 drop into both eyes twice daily.   Start taking generic Claritin or Zyrtec daily for the next 2-3 weeks.   Please notify us if no improvement in symptoms in 1 week.  It was a pleasure meeting you!  Allergic Conjunctivitis Allergic conjunctivitis is inflammation of the clear membrane that covers the white part of your eye and the inner surface of your eyelid (conjunctiva), and it is caused by allergies. The blood vessels in the conjunctiva become inflamed, and this causes the eye to become red or pink, and it often causes itchiness in the eye. Allergic conjunctivitis cannot be spread by one person to another person (noncontagious). CAUSES This condition is caused by an allergic reaction. Common causes of an allergic reaction (allergens) include:  Dust.  Pollen.  Mold.  Animal dander or secretions. RISK FACTORS This condition is more likely to develop if you are exposed to high levels of allergens that cause the allergic reaction. This might include being outdoors when air pollen levels are high or being around animals that you are allergic to. SYMPTOMS Symptoms of this condition may include:  Eye redness.  Tearing of the eyes.  Watery eyes.  Itchy eyes.  Burning feeling in the eyes.  Clear drainage from the eyes.  Swollen eyelids. DIAGNOSIS This condition may be diagnosed by medical history and physical exam. If you have drainage from your eyes, it may be tested to rule out other causes of conjunctivitis. TREATMENT Treatment for this condition often includes medicines. These may be eye drops, ointments, or oral medicines. They may be prescription medicines or over-the-counter medicines. HOME CARE INSTRUCTIONS  Take or apply medicines only as directed by your health care provider.  Do not touch or rub your eyes.  Do not wear contact lenses until the inflammation is gone. Wear glasses instead.  Do not  wear eye makeup until the inflammation is gone.  Apply a cool, clean washcloth to your eye for 10-20 minutes, 3-4 times a day.  Try to avoid whatever allergen is causing the allergic reaction. SEEK MEDICAL CARE IF:  Your symptoms get worse.  You have pus draining from your eye.  You have new symptoms.  You have a fever.   This information is not intended to replace advice given to you by your health care provider. Make sure you discuss any questions you have with your health care provider.   Document Released: 11/20/2002 Document Revised: 09/20/2014 Document Reviewed: 06/11/2014 Elsevier Interactive Patient Education [employer]2016 Elsevier Inc.

## 2015-12-22 NOTE — Progress Notes (Signed)
Subjective:    Patient ID: Taylor Bautista, female    DOB: 07/27/1991, 25 y.o.   MRN: [id number]  HPI  Ms. Taylor Bautista is a 25 year old female who presents today with a chief complaint of dry, itchy eyes. She also reports irritation with redness to her eyes after rubbing. She's tried several eye drops OTC and taking Claritin and Zyrtec without improvement. Denies fevers, sensation of her eyes matted shut, rhinorrhea, post nasal drip, cough. She's not previously been treated with RX eye drops in the past as this is her first encounter for these symptoms.   Review of Systems  Constitutional: Negative for fever.  HENT: Negative for postnasal drip and rhinorrhea.   Eyes: Positive for redness and itching. Negative for discharge.  Respiratory: Negative for cough.        Past Medical History  Diagnosis Date  . Allergy   . Dysthymic disorder   . Dysmenorrhea   . Exposure to STD   . MRSA infection   . Tobacco use disorder     Social History   Social History  . Marital Status: Single    Spouse Name: N/A  . Number of Children: N/A  . Years of Education: N/A   Occupational History  . Not on file.   Social History Main Topics  . Smoking status: Former Smoker    Quit date: 04/13/2012  . Smokeless tobacco: Never Used  . Alcohol Use: 0.6 oz/week    0 Standard drinks or equivalent, 0 Glasses of wine, 1 Cans of beer, 0 Shots of liquor per week     Comment: social drinking  . 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 Sexual Activity:    Partners: Male    Pharmacist, hospitalBirth Control/ Protection: IUD   Other Topics Concern  . Not on file   Social History Narrative   Lives with mother      Works at [employer]          Past Surgical History  Procedure Laterality Date  . Pigmented nevus removal  04/1989    Family History  Problem Relation Age of Onset  . Cancer Mother 8839    one breast  . Cancer Cousin 24    breast  . Anxiety  disorder Other   . Cancer Sister 3923    lymph node cancer twice  . Anxiety disorder Sister   . Depression Sister     No Known Allergies  Current Outpatient Prescriptions on File Prior to Visit  Medication Sig Dispense Refill  . ALPRAZolam (XANAX) 0.5 MG tablet Take 1 tablet (0.5 mg total) by mouth 3 (three) times daily as needed for sleep or anxiety. 90 tablet 0   Current Facility-Administered Medications on File Prior to Visit  Medication Dose Route Frequency Provider Last Rate Last Dose  . medroxyPROGESTERone (DEPO-PROVERA) injection 150 mg  150 mg Intramuscular Q90 days Tereso NewcomerUgonna A Anyanwu, MD   150 mg at 03/28/15 1042    BP 110/64 mmHg  Pulse 90  Temp(Src) 97.9 F (36.6 C) (Oral)  Ht 5\' 6"  (1.676 m)  Wt 131 lb 12.8 oz (59.784 kg)  BMI 21.28 kg/m2  SpO2 96%    Objective:   Physical Exam  HENT:  Nose: Nose normal.  Mouth/Throat: Oropharynx is clear and moist.  Eyes: Pupils are equal, round, and reactive to light. Right eye exhibits no discharge. No foreign body present in the right eye.  Left eye exhibits no discharge. No foreign body present in the left eye.  Mildly injected, more so to left conjunctiva. No discharge or evidence of bacterial or viral involvement.  Cardiovascular: Normal rate and regular rhythm.   Pulmonary/Chest: Effort normal and breath sounds normal.          Assessment & Plan:  Allergic Conjunctivitis:  Complaints of eye irritation, itching and watering to bilateral eyes x several weeks. No improvement with OTC treatment. Exam without evidence of bacterial or viral involvement. Suspect allergy induced and will treat. RX for Patanol BID gtts and will have her start daily Claritin. She is to notify us if no improvement in symptoms in 1 week.

## 2015-12-22 NOTE — Progress Notes (Signed)
Pre visit review using our clinic review tool, if applicable. No additional management support is needed unless otherwise documented below in the visit note. 

## 2016-01-01 ENCOUNTER — Ambulatory Visit: Payer: 59

## 2016-01-07 ENCOUNTER — Ambulatory Visit (INDEPENDENT_AMBULATORY_CARE_PROVIDER_SITE_OTHER): Payer: 59 | Admitting: *Deleted

## 2016-01-07 DIAGNOSIS — Z3042 Encounter for surveillance of injectable contraceptive: Secondary | ICD-10-CM

## 2016-01-07 DIAGNOSIS — Z01812 Encounter for preprocedural laboratory examination: Secondary | ICD-10-CM

## 2016-01-07 LAB — POCT URINE PREGNANCY: Preg Test, Ur: NEGATIVE

## 2016-01-08 ENCOUNTER — Telehealth: Payer: Self-pay

## 2016-01-08 NOTE — Telephone Encounter (Signed)
Will send to PCP for suggestions/input.

## 2016-01-08 NOTE — Telephone Encounter (Signed)
Pt left v/m; pt was seen 12/22/15; pt using patanol eye gtts as instructed and the dry itchy eyes are no better. Pt request cb.

## 2016-01-08 NOTE — Telephone Encounter (Signed)
Pt is taking allergy meds and pt will give more time and add TID cool compress

## 2016-01-08 NOTE — Telephone Encounter (Signed)
Verify that she is taking daily antihistamine. I would continue Patinol, cool compresses TID. If persist, she needs to see an eye doctor.

## 2016-01-09 ENCOUNTER — Ambulatory Visit: Payer: 59 | Admitting: Primary Care

## 2016-01-09 ENCOUNTER — Telehealth: Payer: Self-pay | Admitting: Internal Medicine

## 2016-01-09 DIAGNOSIS — Z0289 Encounter for other administrative examinations: Secondary | ICD-10-CM

## 2016-01-09 NOTE — Telephone Encounter (Signed)
No follow up needed

## 2016-01-09 NOTE — Telephone Encounter (Signed)
Patient did not come for their scheduled appointment today follow up eyes  Please let me know if the patient needs to be contacted immediately for follow up or if no follow up is necessary.

## 2016-01-14 ENCOUNTER — Telehealth: Payer: Self-pay | Admitting: Internal Medicine

## 2016-01-14 NOTE — Telephone Encounter (Signed)
Pt called - nothing is helping with her eyes. Pt is requesting a referral to an eye doctor.  cb number is [phone] Thank you

## 2016-01-19 ENCOUNTER — Telehealth: Payer: Self-pay | Admitting: *Deleted

## 2016-01-19 DIAGNOSIS — N898 Other specified noninflammatory disorders of vagina: Secondary | ICD-10-CM

## 2016-01-19 MED ORDER — METRONIDAZOLE 500 MG PO TABS: 500.0000 mg | ORAL_TABLET | Freq: Two times a day (BID) | ORAL | Status: DC

## 2016-01-19 MED ORDER — FLUCONAZOLE 150 MG PO TABS: 150.0000 mg | ORAL_TABLET | Freq: Once | ORAL | Status: DC

## 2016-01-19 NOTE — Telephone Encounter (Signed)
Will send in Flagyl and Diflucan - Called pt to adv take Flagyl and if needed can take Diflucan after she finished abx - Advised pt to come in office for eval if Flagyl does not relieve symptoms.

## 2016-01-19 NOTE — Telephone Encounter (Signed)
-----   Message from Olevia BowensJacinda S Battle sent at 01/19/2016  9:19 AM EDT ----- Regarding: Rx Request Contact: [phone] Wants something called in for BV, last seen by MD 04/2015

## 2016-01-22 ENCOUNTER — Other Ambulatory Visit: Payer: Self-pay | Admitting: Internal Medicine

## 2016-01-22 DIAGNOSIS — H5713 Ocular pain, bilateral: Secondary | ICD-10-CM

## 2016-01-22 DIAGNOSIS — R6889 Other general symptoms and signs: Secondary | ICD-10-CM

## 2016-01-22 NOTE — Telephone Encounter (Signed)
I never saw the request for referral, last I heard from her, she wanted to give it more time. I will place referral to optho

## 2016-01-22 NOTE — Telephone Encounter (Signed)
Pt called, upset that she had not gotten a call back on Eye Doctor referral.  [facility] just [facility].  Most significant symptom is Itching and feel like something is in her eye.  Best number to call [phone]

## 2016-02-03 ENCOUNTER — Other Ambulatory Visit (INDEPENDENT_AMBULATORY_CARE_PROVIDER_SITE_OTHER): Payer: 59 | Admitting: *Deleted

## 2016-02-03 DIAGNOSIS — N3001 Acute cystitis with hematuria: Secondary | ICD-10-CM | POA: Diagnosis not present

## 2016-02-03 DIAGNOSIS — R350 Frequency of micturition: Secondary | ICD-10-CM

## 2016-02-03 LAB — POCT URINALYSIS DIPSTICK
Bilirubin, UA: NEGATIVE
GLUCOSE UA: NEGATIVE
Ketones, UA: NEGATIVE
NITRITE UA: NEGATIVE
PH UA: 5
SPEC GRAV UA: 1.025
UROBILINOGEN UA: 0.2

## 2016-02-03 MED ORDER — PHENAZOPYRIDINE HCL 200 MG PO TABS: 200.0000 mg | ORAL_TABLET | Freq: Three times a day (TID) | ORAL | Status: DC | PRN

## 2016-02-03 MED ORDER — SULFAMETHOXAZOLE-TRIMETHOPRIM 800-160 MG PO TABS: 1.0000 | ORAL_TABLET | Freq: Two times a day (BID) | ORAL | Status: DC

## 2016-02-03 NOTE — Progress Notes (Signed)
Pt came in office c/o urinary frequency and burning noted at the end of voiding. UA + trace leukocytes and + blood.  Will send urine culture.  Bactrim and Pyridium sent to pharmacy and instructed pt on medication use.  Encouraged pt to increase fluid intake and empty bladder often.  Will call pt with urine cx result.

## 2016-02-06 LAB — URINE CULTURE: Colony Count: >=100000

## 2016-03-10 ENCOUNTER — Other Ambulatory Visit (INDEPENDENT_AMBULATORY_CARE_PROVIDER_SITE_OTHER): Payer: 59 | Admitting: *Deleted

## 2016-03-10 DIAGNOSIS — R350 Frequency of micturition: Secondary | ICD-10-CM

## 2016-03-10 LAB — POCT URINALYSIS DIPSTICK
Bilirubin, UA: NEGATIVE
GLUCOSE UA: NEGATIVE
Ketones, UA: NEGATIVE
Leukocytes, UA: NEGATIVE
NITRITE UA: NEGATIVE
Protein, UA: NEGATIVE
Spec Grav, UA: 1.015
UROBILINOGEN UA: 0.2
pH, UA: 5

## 2016-03-13 LAB — CULTURE, URINE COMPREHENSIVE

## 2016-03-15 ENCOUNTER — Other Ambulatory Visit: Payer: Self-pay | Admitting: Obstetrics & Gynecology

## 2016-03-15 ENCOUNTER — Telehealth: Payer: Self-pay | Admitting: *Deleted

## 2016-03-15 DIAGNOSIS — N390 Urinary tract infection, site not specified: Secondary | ICD-10-CM

## 2016-03-15 MED ORDER — AMPICILLIN 250 MG PO CAPS: 250.0000 mg | ORAL_CAPSULE | Freq: Four times a day (QID) | ORAL | Status: DC

## 2016-03-15 NOTE — Telephone Encounter (Signed)
Called pt to inform her of urine cx result and treatment regimen, pt not available, left message with her mother for pt to call the office.

## 2016-03-17 ENCOUNTER — Telehealth: Payer: Self-pay | Admitting: *Deleted

## 2016-03-17 NOTE — Telephone Encounter (Signed)
Pt returned call, informed her of urine culture result and instructed on medication use.

## 2016-03-22 ENCOUNTER — Telehealth: Payer: Self-pay | Admitting: *Deleted

## 2016-03-22 DIAGNOSIS — B379 Candidiasis, unspecified: Secondary | ICD-10-CM

## 2016-03-22 MED ORDER — FLUCONAZOLE 150 MG PO TABS: 150.0000 mg | ORAL_TABLET | Freq: Once | ORAL | Status: DC

## 2016-03-22 NOTE — Telephone Encounter (Signed)
Pt just completed a round of antibiotics for a UTI, is c/o vaginal burning persistent with a yeast infection.  Sent Diflucan to the pharmacy. Instructed pt on medication use.

## 2016-03-24 ENCOUNTER — Ambulatory Visit (INDEPENDENT_AMBULATORY_CARE_PROVIDER_SITE_OTHER): Payer: 59 | Admitting: *Deleted

## 2016-03-24 DIAGNOSIS — Z3042 Encounter for surveillance of injectable contraceptive: Secondary | ICD-10-CM | POA: Diagnosis not present

## 2016-03-24 NOTE — Progress Notes (Signed)
Patient ID: Taylor Bautista, female   DOB: 08/10/1991, 25 y.o.   MRN: [id number] Pt here for Depo Provera Injection as scheduled. Reports she may want to change type of contraception in the near future due to abnormal weight gain.

## 2016-03-26 ENCOUNTER — Telehealth: Payer: Self-pay | Admitting: *Deleted

## 2016-03-26 DIAGNOSIS — N898 Other specified noninflammatory disorders of vagina: Secondary | ICD-10-CM

## 2016-03-26 MED ORDER — METRONIDAZOLE 500 MG PO TABS: 500.0000 mg | ORAL_TABLET | Freq: Two times a day (BID) | ORAL | Status: DC

## 2016-03-26 NOTE — Telephone Encounter (Signed)
Pt called c/o vaginal discharge and odor, believes she may have a bacterial infection.  Sent Flagyl to pharmacy.  Informed pt if she is still symptomatic after treatment that she will need to come in for evaluation. Pt acknowledged.

## 2016-06-01 ENCOUNTER — Emergency Department (HOSPITAL_COMMUNITY): Payer: 59

## 2016-06-01 ENCOUNTER — Encounter (HOSPITAL_COMMUNITY): Payer: Self-pay | Admitting: Emergency Medicine

## 2016-06-01 ENCOUNTER — Emergency Department (HOSPITAL_COMMUNITY)
Admission: EM | Admit: 2016-06-01 | Discharge: 2016-06-01 | Disposition: A | Discharge status: Home / Self Care | Payer: 59 | Attending: Emergency Medicine | Admitting: Emergency Medicine

## 2016-06-01 ENCOUNTER — Ambulatory Visit: Payer: 59 | Admitting: Primary Care

## 2016-06-01 DIAGNOSIS — S20212A Contusion of left front wall of thorax, initial encounter: Secondary | ICD-10-CM | POA: Insufficient documentation

## 2016-06-01 DIAGNOSIS — Z87891 Personal history of nicotine dependence: Secondary | ICD-10-CM | POA: Diagnosis not present

## 2016-06-01 DIAGNOSIS — X58XXXA Exposure to other specified factors, initial encounter: Secondary | ICD-10-CM | POA: Diagnosis not present

## 2016-06-01 DIAGNOSIS — Y929 Unspecified place or not applicable: Secondary | ICD-10-CM | POA: Diagnosis not present

## 2016-06-01 DIAGNOSIS — S30811A Abrasion of abdominal wall, initial encounter: Secondary | ICD-10-CM | POA: Diagnosis not present

## 2016-06-01 DIAGNOSIS — Z23 Encounter for immunization: Secondary | ICD-10-CM | POA: Insufficient documentation

## 2016-06-01 DIAGNOSIS — Y939 Activity, unspecified: Secondary | ICD-10-CM | POA: Diagnosis not present

## 2016-06-01 DIAGNOSIS — S299XXA Unspecified injury of thorax, initial encounter: Secondary | ICD-10-CM | POA: Diagnosis present

## 2016-06-01 DIAGNOSIS — S298XXA Other specified injuries of thorax, initial encounter: Secondary | ICD-10-CM

## 2016-06-01 DIAGNOSIS — Y999 Unspecified external cause status: Secondary | ICD-10-CM | POA: Insufficient documentation

## 2016-06-01 DIAGNOSIS — T148XXA Other injury of unspecified body region, initial encounter: Secondary | ICD-10-CM

## 2016-06-01 MED ORDER — ACETAMINOPHEN-CODEINE #3 300-30 MG PO TABS: 1.0000 | ORAL_TABLET | Freq: Four times a day (QID) | ORAL | 0 refills | Status: DC | PRN

## 2016-06-01 MED ORDER — SILVER SULFADIAZINE 1 % EX CREA: 1.0000 "application " | TOPICAL_CREAM | Freq: Every day | CUTANEOUS | 0 refills | Status: DC

## 2016-06-01 MED ORDER — HYDROCODONE-ACETAMINOPHEN 5-325 MG PO TABS
1.0000 | ORAL_TABLET | Freq: Once | ORAL | Status: AC
  Administered 2016-06-01: 1 via ORAL
  Filled 2016-06-01: qty 1

## 2016-06-01 MED ORDER — TETANUS-DIPHTH-ACELL PERTUSSIS 5-2.5-18.5 LF-MCG/0.5 IM SUSP
0.5000 mL | Freq: Once | INTRAMUSCULAR | Status: AC
  Administered 2016-06-01: 0.5 mL via INTRAMUSCULAR
  Filled 2016-06-01: qty 0.5
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
## 2016-06-01 NOTE — Discharge Instructions (Signed)
Alternate applying silvadene cream and neosporin to wound. Take pain medication as needed. Follow up with PCP for re-evaluation if symptoms do not improve. Return to the ED if you experience severe worsening of your symptoms, loss of control of your bowel or bladder, numbness or tingling in your lower extremities, fever, redness or swelling around your wound.

## 2016-06-01 NOTE — ED Notes (Signed)
Patient transported to X-ray 

## 2016-06-03 ENCOUNTER — Ambulatory Visit (INDEPENDENT_AMBULATORY_CARE_PROVIDER_SITE_OTHER): Payer: 59 | Admitting: Internal Medicine

## 2016-06-03 ENCOUNTER — Encounter: Payer: Self-pay | Admitting: Internal Medicine

## 2016-06-03 VITALS — BP 98/60 | HR 98 | Temp 98.9°F | Wt 151.0 lb

## 2016-06-03 DIAGNOSIS — M545 Low back pain, unspecified: Secondary | ICD-10-CM

## 2016-06-03 DIAGNOSIS — S20412D Abrasion of left back wall of thorax, subsequent encounter: Secondary | ICD-10-CM | POA: Diagnosis not present

## 2016-06-03 DIAGNOSIS — R0781 Pleurodynia: Secondary | ICD-10-CM | POA: Diagnosis not present

## 2016-06-03 MED ORDER — METHOCARBAMOL 500 MG PO TABS: 500.0000 mg | ORAL_TABLET | Freq: Three times a day (TID) | ORAL | 0 refills | Status: DC | PRN

## 2016-06-03 MED ORDER — IBUPROFEN 800 MG PO TABS: 800.0000 mg | ORAL_TABLET | Freq: Three times a day (TID) | ORAL | 0 refills | Status: DC | PRN

## 2016-06-03 NOTE — Patient Instructions (Signed)

## 2016-06-03 NOTE — Progress Notes (Signed)
Pre visit review using our clinic review tool, if applicable. No additional management support is needed unless otherwise documented below in the visit note. 
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
## 2016-06-24 ENCOUNTER — Ambulatory Visit (INDEPENDENT_AMBULATORY_CARE_PROVIDER_SITE_OTHER): Payer: 59 | Admitting: Obstetrics and Gynecology

## 2016-06-24 ENCOUNTER — Encounter: Payer: Self-pay | Admitting: Obstetrics and Gynecology

## 2016-06-24 VITALS — BP 111/75 | HR 109 | Ht 66.0 in | Wt 153.0 lb

## 2016-06-24 DIAGNOSIS — Z30430 Encounter for insertion of intrauterine contraceptive device: Secondary | ICD-10-CM | POA: Diagnosis not present

## 2016-06-24 DIAGNOSIS — Z124 Encounter for screening for malignant neoplasm of cervix: Secondary | ICD-10-CM

## 2016-06-24 DIAGNOSIS — Z3202 Encounter for pregnancy test, result negative: Secondary | ICD-10-CM

## 2016-06-24 DIAGNOSIS — Z113 Encounter for screening for infections with a predominantly sexual mode of transmission: Secondary | ICD-10-CM

## 2016-06-24 DIAGNOSIS — Z1151 Encounter for screening for human papillomavirus (HPV): Secondary | ICD-10-CM | POA: Diagnosis not present

## 2016-06-24 LAB — POCT URINE PREGNANCY: PREG TEST UR: NEGATIVE

## 2016-06-24 MED ORDER — LEVONORGESTREL 18.6 MCG/DAY IU IUD
INTRAUTERINE_SYSTEM | Freq: Once | INTRAUTERINE | Status: AC
  Administered 2016-06-24: 11:00:00 via INTRAUTERINE

## 2016-06-24 NOTE — Procedures (Addendum)
Intrauterine Device (IUD) Insertion Procedure Note   Patient has been on depo but desires to switch due to weight gain. She had a LNG IUD in the past for about 6-443m but had it taken out due to BV episodes which she now attributes to changing soaps, so she'd like another LNG IUD.  Pt was amenorrheic on it and has been on the depo, too.   Last pap was 2014 and negative and GC/CT last year was negative. UPT negative today. Her written consent was obtained. The patient understands the risks of IUD placement, which include but are not limited to: bleeding, infection, uterine perforation, risk of expulsion, risk of failure < 1%, increased risk of ectopic pregnancy in the event of failure.   Prior to the procedure being performed, the patient (or guardian) was asked to state their full name, date of birth, and the type of procedure being performed. A bimanual exam showed the uterus to be anteverted.  Next, a Graves speculum was placed and pap, GC/CT was obtained. The cervix and vagina were then cleaned with an antiseptic solution, and the cervix was grasped with a tenaculum.  The uterus was sounded to 8 cm.  The Liletta was placed without difficulty in the usual, sterile fashion.  The strings were cut to 3-4 cm.  The tenaculum was removed and cervix was found to be hemostatic.    No complications, patient tolerated the procedure well.  Cornelia Copaharlie Monserrate Blaschke, Jr MD Attending [facility]ulty Practice)

## 2016-06-25 LAB — CYTOLOGY - PAP

## 2016-06-28 ENCOUNTER — Telehealth: Payer: Self-pay | Admitting: *Deleted

## 2016-06-28 NOTE — Telephone Encounter (Signed)
-----   Message from [place] Bingharlie Pickens, MD sent at 06/28/2016 12:34 PM EDT ----- Can you just let her know that everything was negative and to just repeat her pap at age 25? thanks

## 2016-06-28 NOTE — Telephone Encounter (Signed)
Called pt, informed her of normal pap and follow-up recommendations.

## 2016-07-05 ENCOUNTER — Encounter: Payer: Self-pay | Admitting: Internal Medicine

## 2016-07-05 ENCOUNTER — Ambulatory Visit (INDEPENDENT_AMBULATORY_CARE_PROVIDER_SITE_OTHER): Payer: 59 | Admitting: Internal Medicine

## 2016-07-05 ENCOUNTER — Ambulatory Visit: Payer: 59 | Admitting: Internal Medicine

## 2016-07-05 VITALS — BP 102/64 | HR 78 | Temp 98.7°F | Wt 153.5 lb

## 2016-07-05 DIAGNOSIS — J301 Allergic rhinitis due to pollen: Secondary | ICD-10-CM | POA: Diagnosis not present

## 2016-07-05 DIAGNOSIS — R519 Headache, unspecified: Secondary | ICD-10-CM

## 2016-07-05 DIAGNOSIS — R51 Headache: Secondary | ICD-10-CM

## 2016-07-05 MED ORDER — SUMATRIPTAN SUCCINATE 25 MG PO TABS: 25.0000 mg | ORAL_TABLET | ORAL | 0 refills | Status: DC | PRN

## 2016-07-05 MED ORDER — CETIRIZINE HCL 10 MG PO TABS: 10.0000 mg | ORAL_TABLET | Freq: Every day | ORAL | 0 refills | Status: DC

## 2016-07-05 NOTE — Progress Notes (Signed)
HPI  Pt presents to the clinic today with c/o headache, itchy eyes, left ear pain and runny nose. She reports this started 3-4 days ago. The headache is located in her temples. She describes the pain as pounding. She is having sensitivity to light and sound. She denies decreased hearing or ringing in her ears. She is blowing green mucous out of her nose. She denies sore throat, cough or chest congestion. She denies fever, chills or body aches. She has tried Advil, Aleve, cough syrup and Visine Allergy Eyes without any relief. She has a history of migraines and seasonal allergies. She has not had sick contacts that she is aware of.  Review of Systems    Past Medical History:  Diagnosis Date  . Allergy   . Dysmenorrhea   . Dysthymic disorder   . Exposure to STD   . MRSA infection   . Tobacco use disorder     Family History  Problem Relation Age of Onset  . Cancer Mother 75    one breast  . Cancer Cousin 24    breast  . Anxiety disorder Other   . Cancer Sister 21    lymph node cancer twice  . Anxiety disorder Sister   . Depression Sister     Social History   Social History  . Marital status: Married    Spouse name: N/A  . Number of children: N/A  . Years of education: N/A   Occupational History  . Not on file.   Social History Main Topics  . Smoking status: Former Smoker    Quit date: 04/13/2012  . Smokeless tobacco: Never Used  . Alcohol use No  . 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 Sexual activity: Not Currently    Partners: Male    Birth control/ protection: IUD   Other Topics Concern  . Not on file   Social History Narrative   Lives with mother      Works at [employer]          No Known Allergies   Constitutional: Positive headache. Denies fatigue, fever or abrupt weight changes.  HEENT:  Positive itchy eyes, runny nose. Denies eye redness, ear pain, ringing in the ears, wax buildup, or  bloody nose. Respiratory: Denies cough, difficulty breathing or shortness of breath.  Cardiovascular: Denies chest pain, chest tightness, palpitations or swelling in the hands or feet.   No other specific complaints in a complete review of systems (except as listed in HPI above).  Objective:   BP 102/64   Pulse 78   Temp 98.7 F (37.1 C) (Oral)   Wt 153 lb 8 oz (69.6 kg)   SpO2 98%   BMI 24.78 kg/m   General: Appears her stated age, well developed, well nourished in NAD. HEENT: Head: normal shape and size, no sinus tenderness noted; Eyes: sclera white, no icterus, conjunctiva pink; Ears: Tm's gray and intact, normal light reflex; Nose: mucosa boggy and moist, septum midline; Throat/Mouth: Teeth present, mucosa pink and moist, no exudate noted, no lesions or ulcerations noted.  Neck:  No adenopathy noted.  Cardiovascular: Normal rate and rhythm. S1,S2 noted.  No murmur, rubs or gallops noted.  Pulmonary/Chest: Normal effort and positive vesicular breath sounds. No respiratory distress. No wheezes, rales or ronchi noted.      Assessment & Plan:   Allergic Rhinitis  80 mg Depo IM today. Flonase 2 sprays each nostril  for 3 days and then as needed. eRx for Zyrtec 10 mg PO QHS  Temporal Headache:  80 mg Depo IM today eRx for Sumitriptan (she has taken in the past with good relief)  RTC as needed or if symptoms persist. Nicki ReaperBAITY, REGINA, NP

## 2016-07-05 NOTE — Patient Instructions (Signed)
Allergic Rhinitis Allergic rhinitis is when the mucous membranes in the nose respond to allergens. Allergens are particles in the air that cause your body to have an allergic reaction. This causes you to release allergic antibodies. Through a chain of events, these eventually cause you to release histamine into the blood stream. Although meant to protect the body, it is this release of histamine that causes your discomfort, such as frequent sneezing, congestion, and an itchy, runny nose.  CAUSES Seasonal allergic rhinitis (hay fever) is caused by pollen allergens that may come from grasses, trees, and weeds. Year-round allergic rhinitis (perennial allergic rhinitis) is caused by allergens such as house dust mites, pet dander, and mold spores. SYMPTOMS  Nasal stuffiness (congestion).  Itchy, runny nose with sneezing and tearing of the eyes. DIAGNOSIS Your health care provider can help you determine the allergen or allergens that trigger your symptoms. If you and your health care provider are unable to determine the allergen, skin or blood testing may be used. Your health care provider will diagnose your condition after taking your health history and performing a physical exam. Your health care provider may assess you for other related conditions, such as asthma, pink eye, or an ear infection. TREATMENT Allergic rhinitis does not have a cure, but it can be controlled by:  Medicines that block allergy symptoms. These may include allergy shots, nasal sprays, and oral antihistamines.  Avoiding the allergen. Hay fever may often be treated with antihistamines in pill or nasal spray forms. Antihistamines block the effects of histamine. There are over-the-counter medicines that may help with nasal congestion and swelling around the eyes. Check with your health care provider before taking or giving this medicine. If avoiding the allergen or the medicine prescribed do not work, there are many new medicines  your health care provider can prescribe. Stronger medicine may be used if initial measures are ineffective. Desensitizing injections can be used if medicine and avoidance does not work. Desensitization is when a patient is given ongoing shots until the body becomes less sensitive to the allergen. Make sure you follow up with your health care provider if problems continue. HOME CARE INSTRUCTIONS It is not possible to completely avoid allergens, but you can reduce your symptoms by taking steps to limit your exposure to them. It helps to know exactly what you are allergic to so that you can avoid your specific triggers. SEEK MEDICAL CARE IF:  You have a fever.  You develop a cough that does not stop easily (persistent).  You have shortness of breath.  You start wheezing.  Symptoms interfere with normal daily activities.   This information is not intended to replace advice given to you by your health care provider. Make sure you discuss any questions you have with your health care provider.   Document Released: 05/25/2001 Document Revised: 09/20/2014 Document Reviewed: 05/07/2013 Elsevier Interactive Patient Education 2016 Elsevier Inc[employer]  

## 2016-07-16 MED ORDER — METHYLPREDNISOLONE ACETATE 80 MG/ML IJ SUSP
80.0000 mg | Freq: Once | INTRAMUSCULAR | Status: AC
  Administered 2016-07-05: 80 mg via INTRAMUSCULAR

## 2016-07-16 NOTE — Addendum Note (Signed)
Addended by: Roena MaladyEVONTENNO, Makita Blow Y on: 07/16/2016 09:21 AM   Modules accepted: Orders

## 2016-07-21 ENCOUNTER — Ambulatory Visit: Payer: 59 | Admitting: Obstetrics and Gynecology

## 2016-08-11 ENCOUNTER — Ambulatory Visit: Payer: 59 | Admitting: Obstetrics and Gynecology

## 2016-08-11 ENCOUNTER — Other Ambulatory Visit (INDEPENDENT_AMBULATORY_CARE_PROVIDER_SITE_OTHER): Payer: 59 | Admitting: *Deleted

## 2016-08-11 DIAGNOSIS — R300 Dysuria: Secondary | ICD-10-CM | POA: Diagnosis not present

## 2016-08-11 LAB — POCT URINALYSIS DIPSTICK
Bilirubin, UA: NEGATIVE
GLUCOSE UA: NEGATIVE
KETONES UA: NEGATIVE
Nitrite, UA: NEGATIVE
Protein, UA: NEGATIVE
SPEC GRAV UA: 1.025
UROBILINOGEN UA: 0.2
pH, UA: 6

## 2016-08-11 MED ORDER — PHENAZOPYRIDINE HCL 200 MG PO TABS: 200.0000 mg | ORAL_TABLET | Freq: Three times a day (TID) | ORAL | 0 refills | Status: DC | PRN

## 2016-08-11 MED ORDER — SULFAMETHOXAZOLE-TRIMETHOPRIM 800-160 MG PO TABS: 1.0000 | ORAL_TABLET | Freq: Two times a day (BID) | ORAL | 0 refills | Status: DC

## 2016-08-11 NOTE — Progress Notes (Signed)
Pt is here today c/o pain with urination as well as lower back pain over the past few days.  UA + leukocytes and blood, will send urine cx.  Sent Bactrim and Pyridium to pharmacy and instructed pt on medication use. Will call with urine cx result when available.

## 2016-08-14 LAB — URINE CULTURE

## 2016-08-17 ENCOUNTER — Telehealth: Payer: Self-pay | Admitting: *Deleted

## 2016-08-17 NOTE — Telephone Encounter (Signed)
Informed pt of result, states that she is better after taking the antibiotic.  Instructed to call with any further issues.

## 2016-08-17 NOTE — Telephone Encounter (Signed)
-----   Message from [place] Bingharlie Pickens, MD sent at 08/16/2016  1:30 PM EST ----- Can you let her know that the abx should take care of her UTI based on the culture. thanks

## 2016-08-26 ENCOUNTER — Telehealth: Payer: Self-pay | Admitting: *Deleted

## 2016-08-26 DIAGNOSIS — N390 Urinary tract infection, site not specified: Principal | ICD-10-CM

## 2016-08-26 DIAGNOSIS — B9620 Unspecified Escherichia coli [E. coli] as the cause of diseases classified elsewhere: Secondary | ICD-10-CM

## 2016-08-26 MED ORDER — CIPROFLOXACIN HCL 500 MG PO TABS: 500.0000 mg | ORAL_TABLET | Freq: Two times a day (BID) | ORAL | 0 refills | Status: DC

## 2016-08-26 NOTE — Telephone Encounter (Signed)
Pt was treated with Bactrim last with for UTI with e-coli present, states she is still having symptoms.  Sent Cipro to pharmacy and instructed pt on medication use.

## 2016-09-15 ENCOUNTER — Encounter: Payer: Self-pay | Admitting: Family Medicine

## 2016-09-15 ENCOUNTER — Ambulatory Visit (INDEPENDENT_AMBULATORY_CARE_PROVIDER_SITE_OTHER): Payer: 59 | Admitting: Family Medicine

## 2016-09-15 VITALS — BP 104/70 | HR 95 | Wt 152.0 lb

## 2016-09-15 DIAGNOSIS — G8929 Other chronic pain: Secondary | ICD-10-CM

## 2016-09-15 DIAGNOSIS — M25562 Pain in left knee: Secondary | ICD-10-CM | POA: Diagnosis not present

## 2016-09-15 MED ORDER — MELOXICAM 15 MG PO TABS: 15.0000 mg | ORAL_TABLET | Freq: Every day | ORAL | 1 refills | Status: DC | PRN

## 2016-09-15 NOTE — Patient Instructions (Signed)
Please try a slip on knee brace for additional support Take meloxicam every day for 10-14 days then daily as needed, do not take with additional anti-inflammatories   Knee Pain Knee pain is a very common symptom and can have many causes. Knee pain often goes away when you follow your health care provider's instructions for relieving pain and discomfort at home. However, knee pain can develop into a condition that needs treatment. Some conditions may include:  Arthritis caused by wear and tear (osteoarthritis).  Arthritis caused by swelling and irritation (rheumatoid arthritis or gout).  A cyst or growth in your knee.  An infection in your knee joint.  An injury that will not heal.  Damage, swelling, or irritation of the tissues that support your knee (torn ligaments or tendinitis). If your knee pain continues, additional tests may be ordered to diagnose your condition. Tests may include X-rays or other imaging studies of your knee. You may also need to have fluid removed from your knee. Treatment for ongoing knee pain depends on the cause, but treatment may include:  Medicines to relieve pain or swelling.  Steroid injections in your knee.  Physical therapy.  Surgery. HOME CARE INSTRUCTIONS  Take medicines only as directed by your health care provider.  Rest your knee and keep it raised (elevated) while you are resting.  Do not do things that cause or worsen pain.  Avoid high-impact activities or exercises, such as running, jumping rope, or doing jumping jacks.  Apply ice to the knee area:  Put ice in a plastic bag.  Place a towel between your skin and the bag.  Leave the ice on for 20 minutes, 2-3 times a day.  Ask your health care provider if you should wear an elastic knee support.  Keep a pillow under your knee when you sleep.  Lose weight if you are overweight. Extra weight can put pressure on your knee.  Do not use any tobacco products, including cigarettes,  chewing tobacco, or electronic cigarettes. If you need help quitting, ask your health care provider. Smoking may slow the healing of any bone and joint problems that you may have. SEEK MEDICAL CARE IF:  Your knee pain continues, changes, or gets worse.  You have a fever along with knee pain.  Your knee buckles or locks up.  Your knee becomes more swollen. SEEK IMMEDIATE MEDICAL CARE IF:   Your knee joint feels hot to the touch.  You have chest pain or trouble breathing. This information is not intended to replace advice given to you by your health care provider. Make sure you discuss any questions you have with your health care provider. Document Released: 06/27/2007 Document Revised: 09/20/2014 Document Reviewed: 04/15/2014 Elsevier Interactive Patient Education  2017 [employer]

## 2016-09-15 NOTE — Progress Notes (Signed)
   Subjective:    Patient ID: Taylor Fishermanandice R Bautista, female    DOB: 04/01/1991, 26 y.o.   MRN: [id number]  HPI This is a 26 yo female, accompanied by her 814 yo son, who presents with left knee pain x several months, has gotten worse over last several weeks. No improvement with ibuprofen 800 mg BID (took for about 5 days straight). Feels "sore," with certain movements. No known injury. Pain all the time. Feels it "gives out," occasionally, some sharp pains. Has not tried a brace. Works out, runs, squats. Some pain with squats with 45 pound weights. Has not been able to run, has had to walk instead. 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    Past Medical History:  Diagnosis Date  . Allergy   . Dysmenorrhea   . Dysthymic disorder   . Exposure to STD   . MRSA infection   . Tobacco use disorder    Past Surgical History:  Procedure Laterality Date  . Pigmented nevus removal  04/1989   Family History  Problem Relation Age of Onset  . Cancer Mother 7939    one breast  . Cancer Cousin 24    breast  . Anxiety disorder Other   . Cancer Sister 3923    lymph node cancer twice  . Anxiety disorder Sister   . Depression Sister    Social History  Substance Use Topics  . Smoking status: Former Smoker    Quit date: 04/13/2012  . Smokeless tobacco: Never Used  . Alcohol use No      Review of Systems Per HPI    Objective:   Physical Exam  Constitutional: She appears well-developed and well-nourished. No distress.  HENT:  Head: Normocephalic and atraumatic.  Eyes: Conjunctivae are normal.  Cardiovascular: Normal rate.   Pulmonary/Chest: Effort normal.  Musculoskeletal:       Left knee: She exhibits normal range of motion, no swelling, no effusion, no ecchymosis, no deformity, no erythema, normal alignment, no LCL laxity, normal patellar mobility and no MCL laxity. Tenderness found. Medial joint line tenderness noted.  Skin: She is not diaphoretic.  Vitals reviewed.     BP  104/70   Pulse 95   Wt 152 lb (68.9 kg)   SpO2 100%   BMI 24.53 kg/m  Wt Readings from Last 3 Encounters:  09/15/16 152 lb (68.9 kg)  07/05/16 153 lb 8 oz (69.6 kg)  06/24/16 153 lb (69.4 kg)       Assessment & Plan:  1. Chronic pain of left knee - suggested slip on style brace for support - trial of meloxicam, 10-14 days, she was instructed to not take other NSAIDs with meloxicam - meloxicam (MOBIC) 15 MG tablet; Take 1 tablet (15 mg total) by mouth daily as needed for pain.  Dispense: 30 tablet; Refill: 1 - encouraged her to refrain from exercises that worsen pain, can ice after work outs - if no improvement with above, I suggested she follow up with Dr. Patsy Lageropland (sports medicine)  Olean Reeeborah Gessner, FNP-BC  [facility], [facility]  09/15/2016 9:58 AM

## 2016-10-14 ENCOUNTER — Ambulatory Visit (INDEPENDENT_AMBULATORY_CARE_PROVIDER_SITE_OTHER): Payer: 59 | Admitting: Family Medicine

## 2016-10-14 ENCOUNTER — Encounter: Payer: Self-pay | Admitting: Family Medicine

## 2016-10-14 DIAGNOSIS — R300 Dysuria: Secondary | ICD-10-CM | POA: Diagnosis not present

## 2016-10-14 LAB — POC URINALSYSI DIPSTICK (AUTOMATED)
Bilirubin, UA: NEGATIVE
Blood, UA: NEGATIVE
Glucose, UA: NEGATIVE
KETONES UA: NEGATIVE
LEUKOCYTES UA: NEGATIVE
NITRITE UA: NEGATIVE
PROTEIN UA: NEGATIVE
Spec Grav, UA: >=1.03
UROBILINOGEN UA: 0.2
pH, UA: 6

## 2016-10-14 NOTE — Addendum Note (Signed)
Addended by: Desmond DikeKNIGHT, Chonte Ricke H on: 10/14/2016 02:19 PM   Modules accepted: Orders

## 2016-10-14 NOTE — Assessment & Plan Note (Signed)
Reassurance provided. UA neg Likely due to irritation/reaction to new topical lubricant. Advised not to use this again. Call or return to clinic prn if these symptoms worsen or fail to improve as anticipated. The patient indicates understanding of these issues and agrees with the plan.

## 2016-10-14 NOTE — Progress Notes (Signed)
SUBJECTIVE: Taylor Bautista is a 26 y.o. female pt of Nicki ReaperRegina Baity, who complains of urinary frequency, urgency and dysuria x 3 days, without flank pain, fever, chills, or abnormal vaginal discharge or bleeding.   UA neg for all components.  Upon questioning, burning started the morning after using a new vaginal lubricant during intercourse. Current Outpatient Prescriptions on File Prior to Visit  Medication Sig Dispense Refill  . cetirizine (ZYRTEC) 10 MG tablet Take 1 tablet (10 mg total) by mouth daily. 30 tablet 0  . ibuprofen (ADVIL,MOTRIN) 800 MG tablet Take 1 tablet (800 mg total) by mouth every 8 (eight) hours as needed. 30 tablet 0  . meloxicam (MOBIC) 15 MG tablet Take 1 tablet (15 mg total) by mouth daily as needed for pain. 30 tablet 1  . phenazopyridine (PYRIDIUM) 200 MG tablet Take 1 tablet (200 mg total) by mouth 3 (three) times daily as needed for pain (urethral spasm). 10 tablet 0  . silver sulfADIAZINE (SILVADENE) 1 % cream Apply 1 application topically daily. 20 g 0  . SUMAtriptan (IMITREX) 25 MG tablet Take 1 tablet (25 mg total) by mouth every 2 (two) hours as needed for migraine. May repeat in 2 hours if headache persists or recurs. 10 tablet 0   No current facility-administered medications on file prior to visit.     No Known Allergies  Past Medical History:  Diagnosis Date  . Allergy   . Dysmenorrhea   . Dysthymic disorder   . Exposure to STD   . MRSA infection   . Tobacco use disorder     Past Surgical History:  Procedure Laterality Date  . Pigmented nevus removal  04/1989    Family History  Problem Relation Age of Onset  . Cancer Mother 3639    one breast  . Cancer Cousin 24    breast  . Anxiety disorder Other   . Cancer Sister 6423    lymph node cancer twice  . Anxiety disorder Sister   . Depression Sister     Social History   Social History  . Marital status: Married    Spouse name: N/A  . Number of children: N/A  . Years of education: N/A    Occupational History  . Not on file.   Social History Main Topics  . Smoking status: Former Smoker    Quit date: 04/13/2012  . Smokeless tobacco: Never Used  . Alcohol use No  . 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 Sexual activity: Not Currently    Partners: Male    Birth control/ protection: IUD   Other Topics Concern  . Not on file   Social History Narrative   Lives with mother      Works at [employer]         The PMH, PSH, Social History, Family History, Medications, and allergies have [facility], and have been updated if relevant.  OBJECTIVE: BP 110/62   Pulse 78   Temp 98 F (36.7 C) (Oral)   Wt 150 lb 8 oz (68.3 kg)   SpO2 99%   BMI 24.29 kg/m    Appears well, in no apparent distress.  Vital signs are normal. The abdomen is soft without tenderness, guarding, mass, rebound or organomegaly. No CVA tenderness or inguinal adenopathy noted. Urine dipstick shows negative for all components.

## 2016-10-14 NOTE — Progress Notes (Signed)
Pre visit review using our clinic review tool, if applicable. No additional management support is needed unless otherwise documented below in the visit note. 

## 2016-10-19 ENCOUNTER — Encounter: Payer: Self-pay | Admitting: Family Medicine

## 2016-10-19 ENCOUNTER — Ambulatory Visit (INDEPENDENT_AMBULATORY_CARE_PROVIDER_SITE_OTHER): Payer: 59 | Admitting: Family Medicine

## 2016-10-19 VITALS — BP 102/62 | HR 89 | Temp 98.2°F | Wt 151.8 lb

## 2016-10-19 DIAGNOSIS — N898 Other specified noninflammatory disorders of vagina: Secondary | ICD-10-CM | POA: Insufficient documentation

## 2016-10-19 DIAGNOSIS — R350 Frequency of micturition: Secondary | ICD-10-CM | POA: Diagnosis not present

## 2016-10-19 LAB — POCT WET PREP (WET MOUNT)
KOH Wet Prep POC: NEGATIVE
Trichomonas Wet Prep HPF POC: ABSENT

## 2016-10-19 LAB — POC URINALSYSI DIPSTICK (AUTOMATED)
BILIRUBIN UA: NEGATIVE
Blood, UA: NEGATIVE
GLUCOSE UA: NEGATIVE
Ketones, UA: NEGATIVE
LEUKOCYTES UA: NEGATIVE
Nitrite, UA: NEGATIVE
Protein, UA: NEGATIVE
Spec Grav, UA: >=1.03
Urobilinogen, UA: 0.2
pH, UA: 6

## 2016-10-19 MED ORDER — FLUCONAZOLE 150 MG PO TABS: 150.0000 mg | ORAL_TABLET | Freq: Once | ORAL | 0 refills | Status: AC

## 2016-10-19 NOTE — Patient Instructions (Signed)
Stay away from the lubricant you were using  Take the diflucan as directed  For external itching - monistat cream over the counter is fine  We will alert you when the std probe results return   Update if not starting to improve in a week or if worsening     You may need to return to gyn if no improvement

## 2016-10-19 NOTE — Progress Notes (Signed)
Pre visit review using our clinic review tool, if applicable. No additional management support is needed unless otherwise documented below in the visit note. 

## 2016-10-19 NOTE — Progress Notes (Signed)
Subjective:    Patient ID: Taylor Bautista, female    DOB: 08/28/1991, 28 y.o.   MRN: [id number]  HPI Here for suspected uti and /or yeast infection   Wt Readings from Last 3 Encounters:  10/19/16 151 lb 12 oz (68.8 kg)  10/14/16 150 lb 8 oz (68.3 kg)  09/15/16 152 lb (68.9 kg)    Seen by Dr Dayton MartesAron on 2/1 and ua was neg (for dysuria)  Told her topical lubricant may be irritating her   Now has a yeast odor and some discharge  White in color  Mild itching  Going on for about a week   Some frequency of urination No dysuria or urine odor or change in color  No blood in urine   Doubts STD - but is interested in doing swab for gc and chlamydia   Is utd pap with her gyn  IUD - put in 1.5 mo ago (with progesterone)  Some cramping and sore breasts    Results for orders placed or performed in visit on 10/19/16  POCT Urinalysis Dipstick (Automated)  Result Value Ref Range   Color, UA Yellow    Clarity, UA Clear    Glucose, UA Negative    Bilirubin, UA Negative    Ketones, UA Negative    Spec Grav, UA >=1.030    Blood, UA Negative    pH, UA 6.0    Protein, UA Negative    Urobilinogen, UA 0.2    Nitrite, UA Negative    Leukocytes, UA Negative Negative  POCT Wet Prep (Wet [place])  Result Value Ref Range   Source Wet Prep POC vaginal    WBC, Wet Prep HPF POC many    Bacteria Wet Prep HPF POC Few Few   BACTERIA WET PREP MORPHOLOGY POC     Clue Cells Wet Prep HPF POC None None   Clue Cells Wet Prep Whiff POC     Yeast Wet Prep HPF POC Few    KOH Wet Prep POC neg    Trichomonas Wet Prep HPF POC Absent Absent     Patient Active Problem List   Diagnosis Date Noted  . Vaginal discharge 10/19/2016  . Dysuria 10/14/2016  . Episodic paroxysmal anxiety disorder 12/26/2014  . Anxiety state, unspecified 07/20/2013  . Migraine without aura 12/05/2012  . TOBACCO USE 09/25/2007  . ALLERGIC RHINITIS 03/20/2007   Past Medical History:  Diagnosis Date  . Allergy   .  Dysmenorrhea   . Dysthymic disorder   . Exposure to STD   . MRSA infection   . Tobacco use disorder    Past Surgical History:  Procedure Laterality Date  . Pigmented nevus removal  04/1989   Social History  Substance Use Topics  . Smoking status: Former Smoker    Quit date: 04/13/2012  . Smokeless tobacco: Never Used  . Alcohol use No   Family History  Problem Relation Age of Onset  . Cancer Mother 5239    one breast  . Cancer Cousin 24    breast  . Anxiety disorder Other   . Cancer Sister 3123    lymph node cancer twice  . Anxiety disorder Sister   . Depression Sister    No Known Allergies Current Outpatient Prescriptions on File Prior to Visit  Medication Sig Dispense Refill  . ibuprofen (ADVIL,MOTRIN) 800 MG tablet Take 1 tablet (800 mg total) by mouth every 8 (eight) hours as needed. 30 tablet 0  . SUMAtriptan (IMITREX) 25 MG  tablet Take 1 tablet (25 mg total) by mouth every 2 (two) hours as needed for migraine. May repeat in 2 hours if headache persists or recurs. 10 tablet 0   No current facility-administered medications on file prior to visit.     Review of Systems Review of Systems  Constitutional: Negative for fever, appetite change, fatigue and unexpected weight change.  Eyes: Negative for pain and visual disturbance.  Respiratory: Negative for cough and shortness of breath.   Cardiovascular: Negative for cp or palpitations    Gastrointestinal: Negative for nausea, diarrhea and constipation.  Genitourinary: Negative for urgency and pos for frequency. neg for dysuria or flank pain, pos for vaginal d/c  Skin: Negative for pallor or rash   Neurological: Negative for weakness, light-headedness, numbness and headaches.  Hematological: Negative for adenopathy. Does not bruise/bleed easily.  Psychiatric/Behavioral: Negative for dysphoric mood. The patient is not nervous/anxious.         Objective:   Physical Exam  Constitutional: She appears well-developed and  well-nourished. No distress.  HENT:  Head: Normocephalic and atraumatic.  Mouth/Throat: Oropharynx is clear and moist. No oropharyngeal exudate.  Eyes: Conjunctivae and EOM are normal. Pupils are equal, round, and reactive to light. Right eye exhibits no discharge. Left eye exhibits no discharge.  Neck: Normal range of motion. Neck supple.  Cardiovascular: Normal rate and regular rhythm.   Pulmonary/Chest: Effort normal and breath sounds normal.  Genitourinary: Vaginal discharge found.  Genitourinary Comments: Pale yellow to white vaginal d/c Mild hyperemia of vaginal mucosa  No ulcerations or lesions Urethra appears nl No CMT  Gc/chlam probe obtained as well as wet prep  Musculoskeletal: She exhibits no edema.  Lymphadenopathy:    She has no cervical adenopathy.  Neurological: She is alert.  Skin: Skin is warm and dry. No rash noted. No pallor.  Psychiatric: She has a normal mood and affect.          Assessment & Plan:   Problem List Items Addressed This Visit      Other   Urinary frequency    ua is neg  Also recently neg  ? Due to vaginitis which we will tx  Also send gc/chlam probes      Relevant Orders   POCT Urinalysis Dipstick (Automated) (Completed)   Vaginal discharge - Primary    With some dysuria /frequency in pt with IUD for 1.5 months and also recent rxn to lubricant  Is avoiding lubricant Exam is fairly b9 except for d/c Wet prep- few hyphae -tx with diflucan  Pending gc/chlam probe and update  If no imp - f/u with gyn      Relevant Orders   GC/Chlamydia Probe Amp (Completed)   POCT Wet Prep [facility]) (Completed)

## 2016-10-19 NOTE — Assessment & Plan Note (Signed)
With some dysuria /frequency in pt with IUD for 1.5 months and also recent rxn to lubricant  Is avoiding lubricant Exam is fairly b9 except for d/c Wet prep- few hyphae -tx with diflucan  Pending gc/chlam probe and update  If no imp - f/u with gyn

## 2016-10-20 LAB — GC/CHLAMYDIA PROBE AMP
CT PROBE, AMP APTIMA: NOT DETECTED
GC PROBE AMP APTIMA: NOT DETECTED

## 2016-10-21 DIAGNOSIS — R350 Frequency of micturition: Secondary | ICD-10-CM | POA: Insufficient documentation

## 2016-10-21 NOTE — Assessment & Plan Note (Signed)
ua is neg  Also recently neg  ? Due to vaginitis which we will tx  Also send gc/chlam probes

## 2016-11-04 ENCOUNTER — Encounter: Payer: Self-pay | Admitting: Surgical

## 2016-11-04 ENCOUNTER — Ambulatory Visit (INDEPENDENT_AMBULATORY_CARE_PROVIDER_SITE_OTHER): Payer: 59 | Admitting: Family Medicine

## 2016-11-04 ENCOUNTER — Encounter: Payer: Self-pay | Admitting: Family Medicine

## 2016-11-04 ENCOUNTER — Ambulatory Visit: Payer: 59 | Admitting: Family Medicine

## 2016-11-04 VITALS — BP 118/78 | HR 64 | Temp 98.2°F | Ht 66.0 in | Wt 151.6 lb

## 2016-11-04 DIAGNOSIS — J111 Influenza due to unidentified influenza virus with other respiratory manifestations: Secondary | ICD-10-CM

## 2016-11-04 DIAGNOSIS — R69 Illness, unspecified: Secondary | ICD-10-CM | POA: Diagnosis not present

## 2016-11-04 MED ORDER — OSELTAMIVIR PHOSPHATE 75 MG PO CAPS: 75.0000 mg | ORAL_CAPSULE | Freq: Two times a day (BID) | ORAL | 0 refills | Status: DC

## 2016-11-04 NOTE — Progress Notes (Signed)
Pre visit review using our clinic review tool, if applicable. No additional management support is needed unless otherwise documented below in the visit note. 

## 2016-11-06 NOTE — Progress Notes (Signed)
   Subjective:    Taylor Bautista is a 26 y.o. female who presents for evaluation of influenza like symptoms. Symptoms include chills, dry cough, headache, myalgias, productive cough and fever and have been present for 2 days. She has tried to alleviate the symptoms with acetaminophen and ibuprofen with moderate relief. High risk factors for influenza complications: none.  The pt has not had a flu shot. The patient does not havea history of asthma.   PMHx, SurgHx, SocialHx, Medications, and Allergies were reviewed in the Visit Navigator and updated as appropriate.   Review of Systems: Review of systems as in HPI. Otherwise negative.   Objective:    Vitals:   11/04/16 1128  BP: 118/78  Pulse: 64  Temp: 98.2 F (36.8 C)    General appearance: alert, cooperative and appears stated age HEENT: oropharynx clear, no lesions Lungs: clear to auscultation bilaterally, cough Heart: regular rate and rhythm, S1, S2 normal, no murmur, click, rub or gallop Abdomen: soft, non-tender; bowel sounds normal; no masses,  no organomegaly Extremities: extremities normal, atraumatic, no cyanosis or edema Pulses: 2+ and symmetric Skin: Skin color, texture, turgor normal. No rashes or lesions Neurologic: Grossly normal  Assessment and Plan:    Taylor Bautista was seen today for acute visit, cough and generalized body aches.  Diagnoses and all orders for this visit:  Influenza-like illness -     oseltamivir (TAMIFLU) 75 MG capsule; Take 1 capsule (75 mg total) by mouth 2 (two) times daily.  . Reviewed expectations re: course of current medical issues. . Discussed self-management of symptoms. . Outlined signs and symptoms indicating need for more acute intervention. . Patient verbalized understanding and all questions were answered. . See orders for this visit as documented in the electronic medical record. . Patient received an After Visit Summary.   Helane RimaErica Porche Steinberger, D.O.

## 2016-11-26 ENCOUNTER — Telehealth: Payer: Self-pay

## 2016-11-26 NOTE — Telephone Encounter (Signed)
Pt left a message on triage line asking for a medication to be sent in for a yeast infection. Says she is having vaginal discharge with odor. Did not say if she had tried anything OTC. I tried to call her back, but she did not have VM

## 2016-11-26 NOTE — Telephone Encounter (Signed)
Spoke to pt. She said she tried the Monistat 3 day with very little relief. She [facility] She is aware Rene KocherRegina is out of the office and it may be after hours before she hears back.

## 2016-11-26 NOTE — Telephone Encounter (Signed)
This could be either bacterial or yeast involvement and the treatment is completely different.  Any recent antibiotic use? If not then please schedule her to be seen on Monday next week so we can collect a specimen for proper diagnosis.

## 2016-11-29 NOTE — Telephone Encounter (Signed)
Pt has appt tomorrow

## 2016-11-30 ENCOUNTER — Encounter: Payer: Self-pay | Admitting: Internal Medicine

## 2016-11-30 ENCOUNTER — Ambulatory Visit (INDEPENDENT_AMBULATORY_CARE_PROVIDER_SITE_OTHER): Payer: 59 | Admitting: Internal Medicine

## 2016-11-30 ENCOUNTER — Encounter (INDEPENDENT_AMBULATORY_CARE_PROVIDER_SITE_OTHER): Payer: Self-pay

## 2016-11-30 VITALS — BP 110/62 | HR 66 | Temp 97.9°F | Wt 149.5 lb

## 2016-11-30 DIAGNOSIS — B373 Candidiasis of vulva and vagina: Secondary | ICD-10-CM | POA: Diagnosis not present

## 2016-11-30 DIAGNOSIS — B3731 Acute candidiasis of vulva and vagina: Secondary | ICD-10-CM

## 2016-11-30 MED ORDER — FLUCONAZOLE 150 MG PO TABS: 150.0000 mg | ORAL_TABLET | Freq: Once | ORAL | 0 refills | Status: AC

## 2016-11-30 NOTE — Patient Instructions (Signed)

## 2016-11-30 NOTE — Progress Notes (Signed)
Subjective:    Patient ID: Taylor Bautista, female    DOB: [birth year], 26 y.o.   MRN: [id number]  HPI  Pt presents to the clinic today with c/o vaginal discharge and itching. This started 1 week ago. She reports the discharge is thick and white. She denies pelvic pain. She does have some associated urinary urgency and frequency. She has tried Monistat 3 without any relief. She reports she was on abx 1 week ago.  Review of Systems  Past Medical History:  Diagnosis Date  . Allergy   . Dysmenorrhea   . Dysthymic disorder   . Exposure to STD   . MRSA infection   . Tobacco use disorder     Current Outpatient Prescriptions  Medication Sig Dispense Refill  . ibuprofen (ADVIL,MOTRIN) 800 MG tablet Take 1 tablet (800 mg total) by mouth every 8 (eight) hours as needed. 30 tablet 0  . oseltamivir (TAMIFLU) 75 MG capsule Take 1 capsule (75 mg total) by mouth 2 (two) times daily. 10 capsule 0  . SUMAtriptan (IMITREX) 25 MG tablet Take 1 tablet (25 mg total) by mouth every 2 (two) hours as needed for migraine. May repeat in 2 hours if headache persists or recurs. 10 tablet 0   No current facility-administered medications for this visit.     No Known Allergies  Family History  Problem Relation Age of Onset  . Cancer Mother 24    one breast  . Cancer Cousin 24    breast  . Anxiety disorder Other   . Cancer Sister 36    lymph node cancer twice  . Anxiety disorder Sister   . Depression Sister     Social History   Social History  . Marital status: Married    Spouse name: N/A  . Number of children: N/A  . Years of education: N/A   Occupational History  . Not on file.   Social History Main Topics  . Smoking status: Former Smoker    Quit date: 04/13/2012  . Smokeless tobacco: Never Used  . Alcohol use No  . 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 Sexual activity: Not Currently    Partners: Male    Birth control/  protection: IUD   Other Topics Concern  . Not on file   Social History Narrative   Lives with mother      Works at [employer]           Constitutional: Denies fever, malaise, fatigue, headache or abrupt weight changes.  Gastrointestinal: Denies abdominal pain, bloating, constipation, diarrhea or blood in the stool.  GU: Pt reports urinary urgency, frequency, vaginal discharge and itching. Denies pain with urination, burning sensation, blood in urine, odor.   No other specific complaints in a complete review of systems (except as listed in HPI above).     Objective:   Physical Exam  BP 110/62   Pulse 66   Temp 97.9 F (36.6 C) (Oral)   Wt 149 lb 8 oz (67.8 kg)   SpO2 98%   BMI 24.13 kg/m  Wt Readings from Last 3 Encounters:  11/30/16 149 lb 8 oz (67.8 kg)  11/04/16 151 lb 9.6 oz (68.8 kg)  10/19/16 151 lb 12 oz (68.8 kg)    General: Appears her stated age, well developed, well nourished in NAD. Abdomen: Soft and nontender. Normal bowel sounds. Pelvic: Pt self swabbed.  BMET  Component Value Date/Time   NA 139 04/28/2015 2305   K 3.2 (L) 04/28/2015 2305   CL 103 04/28/2015 2305   CO2 26 04/28/2015 2305   GLUCOSE 123 (H) 04/28/2015 2305   BUN 12 04/28/2015 2305   CREATININE 0.72 04/28/2015 2305   CALCIUM 9.7 04/28/2015 2305   GFRNONAA >60 04/28/2015 2305   GFRAA >60 04/28/2015 2305    Lipid Panel  No results found for: CHOL, TRIG, HDL, CHOLHDL, VLDL, LDLCALC  CBC    Component Value Date/Time   WBC 10.1 04/28/2015 2305   RBC 4.35 04/28/2015 2305   HGB 13.5 04/28/2015 2305   HCT 38.8 04/28/2015 2305   PLT 231 04/28/2015 2305   MCV 89.2 04/28/2015 2305   MCH 31.0 04/28/2015 2305   MCHC 34.8 04/28/2015 2305   RDW 12.2 04/28/2015 2305   LYMPHSABS 1.1 10/16/2012 1135   MONOABS 0.4 10/16/2012 1135   EOSABS 0.1 10/16/2012 1135   BASOSABS 0.0 10/16/2012 1135    Hgb A1C No results found for: HGBA1C          Assessment & Plan:    Candida Vaginitis:  Wet prep: + yeast eRx for Diflucan 150 mg PO x 1 repeat in 3 days   RTC as needed or if symptoms persist or worsen BAITY, REGINA, NP

## 2016-12-13 ENCOUNTER — Telehealth: Payer: Self-pay | Admitting: *Deleted

## 2016-12-13 ENCOUNTER — Ambulatory Visit (INDEPENDENT_AMBULATORY_CARE_PROVIDER_SITE_OTHER): Payer: 59 | Admitting: Family Medicine

## 2016-12-13 ENCOUNTER — Encounter: Payer: Self-pay | Admitting: Family Medicine

## 2016-12-13 VITALS — BP 98/78 | HR 68 | Temp 98.3°F | Ht 66.0 in | Wt 149.0 lb

## 2016-12-13 DIAGNOSIS — J301 Allergic rhinitis due to pollen: Secondary | ICD-10-CM | POA: Diagnosis not present

## 2016-12-13 DIAGNOSIS — G43719 Chronic migraine without aura, intractable, without status migrainosus: Secondary | ICD-10-CM

## 2016-12-13 DIAGNOSIS — G43009 Migraine without aura, not intractable, without status migrainosus: Secondary | ICD-10-CM

## 2016-12-13 MED ORDER — SUMATRIPTAN SUCCINATE 100 MG PO TABS: 100.0000 mg | ORAL_TABLET | Freq: Once | ORAL | 2 refills | Status: DC | PRN

## 2016-12-13 NOTE — Progress Notes (Signed)
   Subjective:    Patient ID: Taylor Bautista, female    DOB: 16-Aug-1991, 26 y.o.   MRN: [id number]  HPI Here asking for help with migraines. She had been averaging one a month, but lately they have increased to once a week. She had been getting good results with Imitrex 25 mg but lately this has not helped. She gets throbbing global headaches with vomiting. The last one was 4 days ago. She also notes that she has a lot of sinus congestion this time of year. She has tried a number of allergy products in the past with poor results.    Review of Systems  Constitutional: Negative.   HENT: Positive for congestion, postnasal drip, rhinorrhea and sinus pressure. Negative for sinus pain and sore throat.   Eyes: Negative.   Respiratory: Negative.   Cardiovascular: Negative.   Neurological: Positive for headaches.       Objective:   Physical Exam  Constitutional: She is oriented to person, place, and time. She appears well-developed and well-nourished. No distress.  HENT:  Right Ear: External ear normal.  Left Ear: External ear normal.  Nose: Nose normal.  Mouth/Throat: Oropharynx is clear and moist.  Eyes: Conjunctivae and EOM are normal. Pupils are equal, round, and reactive to light.  Neck: No thyromegaly present.  Cardiovascular: Normal rate, regular rhythm, normal heart sounds and intact distal pulses.   Pulmonary/Chest: Effort normal and breath sounds normal.  Lymphadenopathy:    She has no cervical adenopathy.  Neurological: She is alert and oriented to person, place, and time. No cranial nerve deficit. Coordination normal.          Assessment & Plan:  It seems her allergies are triggering the migraines. I suggested she try Xyzal daily for these. When she gets a migraine, she can try 100 mg of Imitrex. Add Ibuprofen prn.  Gershon Crane, MD

## 2016-12-13 NOTE — Telephone Encounter (Signed)
Spoke to pt who states she was seen at [facility] for HA. Pt was advised to contact PCP to be referred someone who does botox injections. Pt is not familiar with anyone who does them and states she can be referred anywhere. Pt advised RBaity out of office.

## 2016-12-13 NOTE — Patient Instructions (Signed)
WE NOW OFFER   [facility] FAST TRACK!!!  SAME DAY Appointments for ACUTE CARE  Such as: Sprains, Injuries, cuts, abrasions, rashes, muscle pain, joint pain, back pain Colds, flu, sore throats, headache, allergies, cough, fever  Ear pain, sinus and eye infections Abdominal pain, nausea, vomiting, diarrhea, upset stomach Animal/insect bites  3 Easy Ways to Schedule: Walk-In Scheduling Call in scheduling Mychart Sign-up: [email or web address]         

## 2016-12-13 NOTE — Progress Notes (Signed)
Pre visit review using our clinic review tool, if applicable. No additional management support is needed unless otherwise documented below in the visit note. 

## 2016-12-13 NOTE — Telephone Encounter (Signed)
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 Will send this to PCP for input regarding neurology referral.

## 2016-12-14 NOTE — Telephone Encounter (Signed)
Referral to neurology placed.

## 2016-12-14 NOTE — Addendum Note (Signed)
Addended by: Lorre Munroe on: 12/14/2016 10:08 PM   Modules accepted: Orders

## 2017-01-24 ENCOUNTER — Telehealth: Payer: Self-pay | Admitting: *Deleted

## 2017-01-24 DIAGNOSIS — Z309 Encounter for contraceptive management, unspecified: Secondary | ICD-10-CM

## 2017-01-24 MED ORDER — MEDROXYPROGESTERONE ACETATE 150 MG/ML IM SUSP: 150.0000 mg | INTRAMUSCULAR | 0 refills | Status: DC

## 2017-01-24 NOTE — Telephone Encounter (Signed)
Pt called in wanting Depo sent to pharm so she could bring in with her to next appt to have IUD removed. Sent per pt req

## 2017-01-31 ENCOUNTER — Ambulatory Visit: Payer: 59 | Admitting: Obstetrics and Gynecology

## 2017-01-31 ENCOUNTER — Encounter: Payer: Self-pay | Admitting: Obstetrics and Gynecology

## 2017-01-31 ENCOUNTER — Ambulatory Visit (INDEPENDENT_AMBULATORY_CARE_PROVIDER_SITE_OTHER): Payer: 59 | Admitting: Obstetrics and Gynecology

## 2017-01-31 VITALS — BP 108/75 | HR 71 | Ht 67.0 in | Wt 145.0 lb

## 2017-01-31 DIAGNOSIS — Z30432 Encounter for removal of intrauterine contraceptive device: Secondary | ICD-10-CM | POA: Diagnosis not present

## 2017-01-31 DIAGNOSIS — Z3042 Encounter for surveillance of injectable contraceptive: Secondary | ICD-10-CM | POA: Diagnosis not present

## 2017-01-31 MED ORDER — MEDROXYPROGESTERONE ACETATE 150 MG/ML IM SUSP
150.0000 mg | Freq: Once | INTRAMUSCULAR | Status: AC
  Administered 2017-01-31: 150 mg via INTRAMUSCULAR

## 2017-01-31 NOTE — Procedures (Signed)
Intrauterine Device (IUD) Removal Procedure Note   Patient had Liletta placed in 06/2016. She is amenorrheic on it but has pain with intercourse and occasional cramping and would like to have it removed. U/s offered but pt declines.  Graves speculum placed and IUD strings seen coming from os and measured approximately 3cm. These were grasped with ringed forceps and easily removed and appeared intact.  No complications, patient tolerated the procedure well. Depo provera today; pt told to abstain or use condoms x 10d. Patient considering BTL. This was d/w her and she'll consider.   Cornelia Copaharlie Kiasia Chou, Jr MD Attending Center for [facility](Faculty Practice)

## 2017-02-12 ENCOUNTER — Encounter (HOSPITAL_COMMUNITY): Payer: Self-pay | Admitting: Emergency Medicine

## 2017-02-12 ENCOUNTER — Emergency Department (HOSPITAL_COMMUNITY)
Admission: EM | Admit: 2017-02-12 | Discharge: 2017-02-12 | Disposition: A | Discharge status: Home / Self Care | Payer: 59 | Attending: Emergency Medicine | Admitting: Emergency Medicine

## 2017-02-12 DIAGNOSIS — J029 Acute pharyngitis, unspecified: Secondary | ICD-10-CM | POA: Insufficient documentation

## 2017-02-12 DIAGNOSIS — Z87891 Personal history of nicotine dependence: Secondary | ICD-10-CM | POA: Insufficient documentation

## 2017-02-12 LAB — RAPID STREP SCREEN (MED CTR MEBANE ONLY): STREPTOCOCCUS, GROUP A SCREEN (DIRECT): NEGATIVE

## 2017-02-12 MED ORDER — MAGIC MOUTHWASH: 5.0000 mL | Freq: Three times a day (TID) | ORAL | 0 refills | Status: DC | PRN

## 2017-02-12 NOTE — ED Triage Notes (Signed)
Pt reports sore throat since yesterday, painful to swallow. Pt has been using numbing spray with no relief.

## 2017-02-12 NOTE — Discharge Instructions (Signed)
Rapid strep today was negative. A throat culture has been sent. If that throat culture is positive, they will contact you and prescribe an antibiotic.  You can alternate Tylenol and ibuprofen as needed for pain. Then takeTylenol weight 4 hours and take ibuprofen wait 4 hours and take Tylenol and continue to alternate.  New can try using the mouthwash as directed for pain. You will need to swish it in her mouth for a minute and then spit it out.  Continue using the throat numbing spray.  Follow-up with her primary care doctor next 24-48 hours for further evaluation.  Return the emergency Department for any worsening pain, fever, difficulty swelling, drooling, difficulty eating or drinking, difficulty breathing any other worsening or concerning symptoms.

## 2017-02-12 NOTE — ED Provider Notes (Signed)
[facility]-EMERGENCY DEPT Provider Note   CSN: [id number] Arrival date & time: 02/12/17  1021  By signing my name below, I, Freida Busmaniana Omoyeni, attest that this documentation has been prepared under the direction and in the presence of  Graciella FreerLindsey Saryah Loper, [place]PA-C. Electronically Signed: Freida Busmaniana Omoyeni, Scribe. 02/12/2017. 10:55 AM.  History   Chief Complaint Chief Complaint  Patient presents with  . Sore Throat    The history is provided by the patient. No language interpreter was used.   HPI Comments:  Taylor Bautista is a 26 y.o. female who presents to the Emergency Department complaining of a sore throat that began yesterday. She is tolerating secretions but notes increased pain when swallowing. She has taken ibuprofen, and used a numbing throat spray without relief. She denies fever, nausea, vomiting, and SOB. No recent sick contacts.   Past Medical History:  Diagnosis Date  . Allergy   . Dysmenorrhea   . Dysthymic disorder   . Exposure to STD   . MRSA infection   . Tobacco use disorder     Patient Active Problem List   Diagnosis Date Noted  . Episodic paroxysmal anxiety disorder 12/26/2014  . Migraine without aura 12/05/2012  . TOBACCO USE 09/25/2007  . Allergic rhinitis 03/20/2007    Past Surgical History:  Procedure Laterality Date  . Pigmented nevus removal  04/1989    OB History    Gravida Para Term Preterm AB Living   3 2 1 1 1 2    SAB TAB Ectopic Multiple Live Births   0 1 0 0 2       Home Medications    Prior to Admission medications   Medication Sig Start Date End Date Taking? Authorizing Provider  medroxyPROGESTERone (DEPO-PROVERA) 150 MG/ML injection Inject 1 mL (150 mg total) into the muscle every 3 (three) months. 01/24/17   [place] BingPickens, Charlie, MD  SUMAtriptan (IMITREX) 100 MG tablet Take 1 tablet (100 mg total) by mouth once as needed for migraine. May repeat in 2 hours if headache persists or recurs. 12/13/16   Nelwyn SalisburyFry, Stephen A, MD    Family History Family  History  Problem Relation Age of Onset  . Cancer Mother 6439       one breast  . Cancer Cousin 24       breast  . Anxiety disorder Other   . Cancer Sister 223       lymph node cancer twice  . Anxiety disorder Sister   . Depression Sister     Social History Social History  Substance Use Topics  . Smoking status: Former Smoker    Quit date: 04/13/2012  . Smokeless tobacco: Never Used  . Alcohol use No     Allergies   Patient has no known allergies.   Review of Systems Review of Systems  Constitutional: Negative for fever.  HENT: Positive for sore throat. Negative for trouble swallowing.   Respiratory: Negative for shortness of breath.   Gastrointestinal: Negative for nausea and vomiting.   Physical Exam Updated Vital Signs BP 111/76   Pulse 83   Temp 98.4 F (36.9 C)   Resp 18   Ht 5\' 7"  (1.702 m)   Wt 68 kg (150 lb)   SpO2 100%   BMI 23.49 kg/m   Physical Exam  Constitutional: She appears well-developed and well-nourished.  Sitting comfortably on examination table  HENT:  Head: Normocephalic and atraumatic.  Posterior oropharynx with erythema; no exudate  No peritonsillar abscess seen  No trismus  Eyes: Conjunctivae and EOM are normal. Right eye exhibits no discharge. Left eye exhibits no discharge. No scleral icterus.  Pulmonary/Chest: Effort normal.  No evidence of respiratory distress. Able to speak in full sentences without difficulty.  Neurological: She is alert.  Skin: Skin is warm and dry.  Psychiatric: She has a normal mood and affect. Her speech is normal and behavior is normal.  Nursing note and vitals reviewed.   ED Treatments / Results  DIAGNOSTIC STUDIES:  Oxygen Saturation is 100% on RA, normal by my interpretation.    COORDINATION OF CARE:  10:50 AM Discussed treatment plan with pt at bedside and pt agreed to plan.  Labs (all labs ordered are listed, but only abnormal results are displayed) Labs Reviewed  RAPID STREP SCREEN (NOT AT  [facility])  CULTURE, GROUP A STREP [facility])    EKG  EKG Interpretation None       Radiology No results found.  Procedures Procedures (including critical care time)  Medications Ordered in ED Medications - No data to display   Initial Impression / Assessment and Plan / ED Course  I have reviewed the triage vital signs and the nursing notes.  Pertinent labs & imaging results that were available during my care of the patient were reviewed by me and considered in my medical decision making (see chart for details).     .26 yo F who presents with sore throat that began yesterday. Patient is afebrile, non-toxic appearing, sitting comfortably on examination table. History/physical exam are not concerning for peritonsillar abscess or deep space neck infection. Patent airway. Patient is tolerating secretions. Consider pharyngitis. Rapid strep ordered at triage.   Pt with negative strep. No abx indicated at this time. Discussed that results of strep culture are pending and patient will be informed if positive result and abx will be called in at that time. Discharge with symptomatic tx.  Specific return precautions discussed. Recommended PCP follow up. Pt appears safe for discharge.   Final Clinical Impressions(s) / ED Diagnoses   Final diagnoses:  Viral pharyngitis    New Prescriptions New Prescriptions   No medications on file  I personally performed the services described in this documentation, which was scribed in my presence. The recorded information has been reviewed and is accurate.     Maxwell Caul, PA-C 02/12/17 1123    Rolan Bucco, MD 02/12/17 [phone]

## 2017-02-13 LAB — CULTURE, GROUP A STREP (THRC)

## 2017-02-14 ENCOUNTER — Telehealth: Payer: Self-pay

## 2017-02-14 ENCOUNTER — Telehealth: Payer: Self-pay | Admitting: Emergency Medicine

## 2017-02-14 NOTE — Progress Notes (Signed)
ED Antimicrobial Stewardship Positive Culture Follow Up   Taylor Bautista is an 26 y.o. female who [facility] with a chief complaint of  Chief Complaint  Patient presents with  . Sore Throat    Recent Results (from the past 720 hour(s))  Rapid strep screen     Status: None   Collection Time: 02/12/17 10:35 AM  Result Value Ref Range Status   Streptococcus, Group A Screen (Direct) NEGATIVE NEGATIVE Final    Comment: (NOTE) A Rapid Antigen test may result negative if the antigen level in the sample is below the detection level of this test. The FDA has not cleared this test as a stand-alone test therefore the rapid antigen negative result has reflexed to a Group A Strep culture.   Culture, group A strep     Status: None   Collection Time: 02/12/17 10:35 AM  Result Value Ref Range Status   Specimen Description THROAT  Final   Special Requests NONE Reflexed from [id number]  Final   Culture FEW GROUP A STREP (S.PYOGENES) ISOLATED  Final   Report Status 02/13/2017 FINAL  Final    [x]  Patient discharged originally without antimicrobial agent and treatment is now indicated  New antibiotic prescription: Amoxicillin 500mg  PO BID x 10 days  ED Provider: Terance HartKelly Gekas PA-C   [name],Taylor Bautista 02/14/2017, 9:17 AM Infectious Diseases Pharmacist Phone# [phone]

## 2017-02-14 NOTE — Telephone Encounter (Signed)
Post ED Visit - Positive Culture Follow-up: Successful Patient Follow-Up  Culture assessed and recommendations reviewed by: [x]  Enzo BiNathan Batchelder, Pharm.D. []  Celedonio MiyamotoJeremy Frens, Pharm.D., BCPS AQ-ID []  Garvin FilaMike Maccia, Pharm.D., BCPS []  Georgina PillionElizabeth Martin, Pharm.D., BCPS []  SharonMinh Pham, Vermont[place]arm.D., BCPS, AAHIVP []  Estella HuskMichelle Turner, Pharm.D., BCPS, AAHIVP []  Lysle Pearlachel Rumbarger, PharmD, BCPS []  Casilda Carlsaylor Stone, PharmD, BCPS []  Pollyann SamplesAndy Johnston, PharmD, BCPS  Positive strep culture  [x]  Patient discharged without antimicrobial prescription and treatment is now indicated []  Organism is resistant to prescribed ED discharge antimicrobial []  Patient with positive blood cultures  Changes discussed with ED provider:Kelly Gekas PA  Start Amoxicillin 500mg  po bid x 10 days #20 NR [facility] to [facility] @ [phone]  Contacted patient, 02/14/17 1256   Berle MullMiller, Vallerie Hentz 02/14/2017, 12:54 PM

## 2017-02-14 NOTE — Telephone Encounter (Signed)
Pt seen Taylor Bautista 02/12/17 and was called and advised throat culture showed strep; abx was sent to pharmacy by ED for pt. Pt went to work today but since now has dx of strep pt cannot work on 02/15/17; pt called [place] for note to be out of work on 02-15-17 but was advised to call pcp. I advised pt per AVS from ED note pt was to be seen by Pamala Hurry Baity NP. Pt does not want to schedule appt since just started abx. Pt request note to be out of work on 02/15/17 due to strep. Pt request cb.

## 2017-02-14 NOTE — Telephone Encounter (Signed)
Ok for note 

## 2017-02-15 NOTE — Telephone Encounter (Signed)
Letter placed in front office, pt is aware

## 2017-03-07 ENCOUNTER — Encounter: Payer: Self-pay | Admitting: *Deleted

## 2017-03-07 ENCOUNTER — Other Ambulatory Visit (INDEPENDENT_AMBULATORY_CARE_PROVIDER_SITE_OTHER): Payer: 59 | Admitting: *Deleted

## 2017-03-07 DIAGNOSIS — R300 Dysuria: Secondary | ICD-10-CM | POA: Diagnosis not present

## 2017-03-07 LAB — POCT URINALYSIS DIPSTICK
Bilirubin, UA: NEGATIVE
Blood, UA: NEGATIVE
GLUCOSE UA: NEGATIVE
KETONES UA: NEGATIVE
SPEC GRAV UA: 1.015 (ref 1.010–1.025)
Urobilinogen, UA: 0.2 E.U./dL
pH, UA: 6 (ref 5.0–8.0)

## 2017-03-07 MED ORDER — FLUCONAZOLE 150 MG PO TABS: 150.0000 mg | ORAL_TABLET | Freq: Once | ORAL | 0 refills | Status: AC

## 2017-03-07 MED ORDER — SULFAMETHOXAZOLE-TRIMETHOPRIM 800-160 MG PO TABS: 1.0000 | ORAL_TABLET | Freq: Two times a day (BID) | ORAL | 1 refills | Status: DC

## 2017-03-07 NOTE — Progress Notes (Signed)
SUBJECTIVE: [name] is a 26 y.o. female who complains of urinary frequency, urgency and dysuria x 4 days, without flank pain, fever, chills, or abnormal vaginal discharge or bleeding.   OBJECTIVE: Appears well, in no apparent distress.  Vital signs are normal. Urine dipstick shows positive for nitrates and positive for leukocytes.    ASSESSMENT: Dysuria  PLAN: Treatment per orders.  Call or return to clinic prn if these symptoms worsen or fail to improve as anticipated.

## 2017-03-10 LAB — CULTURE, URINE COMPREHENSIVE

## 2017-03-23 ENCOUNTER — Other Ambulatory Visit (INDEPENDENT_AMBULATORY_CARE_PROVIDER_SITE_OTHER): Payer: 59 | Admitting: *Deleted

## 2017-03-23 DIAGNOSIS — R300 Dysuria: Secondary | ICD-10-CM | POA: Diagnosis not present

## 2017-03-23 MED ORDER — CIPROFLOXACIN HCL 500 MG PO TABS: 500.0000 mg | ORAL_TABLET | Freq: Two times a day (BID) | ORAL | 0 refills | Status: DC

## 2017-03-23 MED ORDER — PHENAZOPYRIDINE HCL 200 MG PO TABS: 200.0000 mg | ORAL_TABLET | Freq: Three times a day (TID) | ORAL | 0 refills | Status: DC | PRN

## 2017-03-23 NOTE — Progress Notes (Signed)
Pt is here today c/o dysuria after treatment of a UTI.  Pt had urine cx two weeks ago which showed Klebsiella bacteria and was treat with Septra for one week.  Pt states she has completed the antibiotic but is still experiencing the same symptoms.  Spoke with Dr Alvester MorinNewton about pt case, recommended pt to come in to repeat urine culture and send in Cipro for her to start for antibiotic treatment.  Medications sent to pharmacy.

## 2017-03-25 LAB — URINE CULTURE

## 2017-04-13 ENCOUNTER — Telehealth: Payer: Self-pay | Admitting: Radiology

## 2017-04-18 ENCOUNTER — Ambulatory Visit (INDEPENDENT_AMBULATORY_CARE_PROVIDER_SITE_OTHER): Payer: 59 | Admitting: *Deleted

## 2017-04-18 ENCOUNTER — Other Ambulatory Visit: Payer: Self-pay

## 2017-04-18 DIAGNOSIS — Z309 Encounter for contraceptive management, unspecified: Secondary | ICD-10-CM

## 2017-04-18 DIAGNOSIS — Z3042 Encounter for surveillance of injectable contraceptive: Secondary | ICD-10-CM

## 2017-04-18 MED ORDER — MEDROXYPROGESTERONE ACETATE 150 MG/ML IM SUSP
150.0000 mg | Freq: Once | INTRAMUSCULAR | Status: AC
  Administered 2017-04-18: 150 mg via INTRAMUSCULAR

## 2017-04-18 MED ORDER — MEDROXYPROGESTERONE ACETATE 150 MG/ML IM SUSP: 150.0000 mg | INTRAMUSCULAR | 1 refills | Status: DC

## 2017-04-18 NOTE — Progress Notes (Signed)
Last pap 06/2016  Last Depo injection 01/2017

## 2017-05-24 ENCOUNTER — Ambulatory Visit: Payer: 59 | Admitting: Internal Medicine

## 2017-05-24 DIAGNOSIS — Z0289 Encounter for other administrative examinations: Secondary | ICD-10-CM

## 2017-05-25 ENCOUNTER — Telehealth: Payer: Self-pay | Admitting: Internal Medicine

## 2017-05-25 NOTE — Telephone Encounter (Signed)
Patient did not come in for their appointment on 09/11 for ear  Please let me know if patient needs to be contacted immediately for follow up or no follow up needed. Do you want to charge the NSF?

## 2017-05-25 NOTE — Telephone Encounter (Signed)
No follow up needed, yes charge NSF

## 2017-07-11 ENCOUNTER — Ambulatory Visit (INDEPENDENT_AMBULATORY_CARE_PROVIDER_SITE_OTHER): Payer: 59

## 2017-07-11 VITALS — BP 104/70 | HR 76

## 2017-07-11 DIAGNOSIS — Z3042 Encounter for surveillance of injectable contraceptive: Secondary | ICD-10-CM | POA: Diagnosis not present

## 2017-07-11 MED ORDER — MEDROXYPROGESTERONE ACETATE 150 MG/ML IM SUSP
150.0000 mg | Freq: Once | INTRAMUSCULAR | Status: AC
  Administered 2017-07-11: 150 mg via INTRAMUSCULAR

## 2017-07-12 NOTE — Progress Notes (Signed)
Patient presented to office for depo-provera injection. Patient tolerated well and will follow up in three months for next injection.

## 2017-07-13 MED ORDER — MEDROXYPROGESTERONE ACETATE 150 MG/ML IM SUSP
150.0000 mg | Freq: Once | INTRAMUSCULAR | Status: AC
  Administered 2017-07-11: 150 mg via INTRAMUSCULAR

## 2017-07-13 NOTE — Addendum Note (Signed)
Addended by: Cheree DittoGRAHAM, Nasiyah Laverdiere A on: 07/13/2017 08:25 AM   Modules accepted: Orders

## 2017-07-22 NOTE — Telephone Encounter (Signed)
error 

## 2017-08-05 ENCOUNTER — Encounter: Payer: Self-pay | Admitting: Family Medicine

## 2017-08-05 ENCOUNTER — Ambulatory Visit (INDEPENDENT_AMBULATORY_CARE_PROVIDER_SITE_OTHER): Payer: 59 | Admitting: Family Medicine

## 2017-08-05 VITALS — BP 108/70 | HR 83 | Temp 98.6°F | Ht 67.0 in | Wt 137.0 lb

## 2017-08-05 DIAGNOSIS — B9689 Other specified bacterial agents as the cause of diseases classified elsewhere: Secondary | ICD-10-CM

## 2017-08-05 DIAGNOSIS — J019 Acute sinusitis, unspecified: Secondary | ICD-10-CM

## 2017-08-05 MED ORDER — AMOXICILLIN 875 MG PO TABS
875.0000 mg | ORAL_TABLET | Freq: Two times a day (BID) | ORAL | 0 refills | Status: DC
Start: 2017-08-05 — End: 2017-09-23

## 2017-08-05 MED ORDER — PROMETHAZINE-DM 6.25-15 MG/5ML PO SYRP: 5.0000 mL | ORAL_SOLUTION | Freq: Four times a day (QID) | ORAL | 0 refills | Status: DC | PRN

## 2017-08-05 NOTE — Progress Notes (Signed)
   Subjective:    Patient ID: Orie Fishermanandice R Schatz, female    DOB: 05/13/1991, 26 y.o.   MRN: [id number]  HPI URI- sxs started ~1 week ago.  sxs are worsening.  + sore throat, cough, no fevers.  Painful to swallow.  + sinus pain/pressure.  Difficult to assess tooth pain b/c she just got braces.  No known sick contacts.  Bilateral ear pain.   Review of Systems For ROS see HPI     Objective:   Physical Exam  Constitutional: She appears well-developed and well-nourished. No distress.  HENT:  Head: Normocephalic and atraumatic.  Right Ear: Tympanic membrane normal.  Left Ear: Tympanic membrane normal.  Nose: Mucosal edema and rhinorrhea present. Right sinus exhibits maxillary sinus tenderness. Right sinus exhibits no frontal sinus tenderness. Left sinus exhibits maxillary sinus tenderness. Left sinus exhibits no frontal sinus tenderness.  Mouth/Throat: Uvula is midline and mucous membranes are normal. Posterior oropharyngeal erythema present. No oropharyngeal exudate.  Eyes: Conjunctivae and EOM are normal. Pupils are equal, round, and reactive to light.  Neck: Normal range of motion. Neck supple.  Cardiovascular: Normal rate, regular rhythm and normal heart sounds.  Pulmonary/Chest: Effort normal and breath sounds normal. No respiratory distress. She has no wheezes.  Lymphadenopathy:    She has no cervical adenopathy.  Vitals reviewed.         Assessment & Plan:  Acute maxillary sinusitis- new.  Pt's sxs and PE consistent w/ infxn.  No evidence of strep.  Start Amox.  Cough meds prn.  Reviewed supportive care and red flags that should prompt return.  Pt expressed understanding and is in agreement w/ plan.

## 2017-08-05 NOTE — Patient Instructions (Signed)
Follow up as needed or as scheduled Start the Amoxicillin twice daily- take w/ food Drink plenty of fluids Ibuprofen for pain, fever, sore throat REST! Use the cough syrup as needed- may cause drowsiness Mucinex DM for daytime cough/congestion w/out drowsiness Call with any questions or concerns Hang in there!!

## 2017-09-23 ENCOUNTER — Encounter: Payer: Self-pay | Admitting: Family Medicine

## 2017-09-23 ENCOUNTER — Ambulatory Visit (INDEPENDENT_AMBULATORY_CARE_PROVIDER_SITE_OTHER): Payer: 59 | Admitting: Family Medicine

## 2017-09-23 VITALS — BP 114/60 | HR 75 | Temp 98.0°F | Wt 139.8 lb

## 2017-09-23 DIAGNOSIS — R634 Abnormal weight loss: Secondary | ICD-10-CM

## 2017-09-23 DIAGNOSIS — R0789 Other chest pain: Secondary | ICD-10-CM

## 2017-09-23 DIAGNOSIS — F419 Anxiety disorder, unspecified: Secondary | ICD-10-CM | POA: Diagnosis not present

## 2017-09-23 DIAGNOSIS — R5382 Chronic fatigue, unspecified: Secondary | ICD-10-CM | POA: Diagnosis not present

## 2017-09-23 DIAGNOSIS — G43709 Chronic migraine without aura, not intractable, without status migrainosus: Secondary | ICD-10-CM | POA: Diagnosis not present

## 2017-09-23 LAB — CBC WITH DIFFERENTIAL/PLATELET
BASOS ABS: 0 10*3/uL (ref 0.0–0.1)
Basophils Relative: 0.4 % (ref 0.0–3.0)
EOS PCT: 4.6 % (ref 0.0–5.0)
Eosinophils Absolute: 0.3 10*3/uL (ref 0.0–0.7)
HCT: 40.6 % (ref 36.0–46.0)
HEMOGLOBIN: 13.5 g/dL (ref 12.0–15.0)
LYMPHS ABS: 1.6 10*3/uL (ref 0.7–4.0)
LYMPHS PCT: 27.2 % (ref 12.0–46.0)
MCHC: 33.3 g/dL (ref 30.0–36.0)
MCV: 92.2 fl (ref 78.0–100.0)
MONOS PCT: 9.1 % (ref 3.0–12.0)
Monocytes Absolute: 0.5 10*3/uL (ref 0.1–1.0)
Neutro Abs: 3.4 10*3/uL (ref 1.4–7.7)
Neutrophils Relative %: 58.7 % (ref 43.0–77.0)
Platelets: 266 10*3/uL (ref 150.0–400.0)
RBC: 4.4 Mil/uL (ref 3.87–5.11)
RDW: 12.7 % (ref 11.5–15.5)
WBC: 5.8 10*3/uL (ref 4.0–10.5)

## 2017-09-23 LAB — COMPREHENSIVE METABOLIC PANEL
ALK PHOS: 48 U/L (ref 39–117)
ALT: 18 U/L (ref 0–35)
AST: 15 U/L (ref 0–37)
Albumin: 4.3 g/dL (ref 3.5–5.2)
BILIRUBIN TOTAL: 0.5 mg/dL (ref 0.2–1.2)
BUN: 12 mg/dL (ref 6–23)
CALCIUM: 9.3 mg/dL (ref 8.4–10.5)
CO2: 29 mEq/L (ref 19–32)
CREATININE: 0.67 mg/dL (ref 0.40–1.20)
Chloride: 105 mEq/L (ref 96–112)
GFR: 110.84 mL/min (ref >60.00)
GLUCOSE: 82 mg/dL (ref 70–99)
Potassium: 4.3 mEq/L (ref 3.5–5.1)
Sodium: 139 mEq/L (ref 135–145)
TOTAL PROTEIN: 6.9 g/dL (ref 6.0–8.3)

## 2017-09-23 LAB — TSH: TSH: 1.64 u[IU]/mL (ref 0.35–4.50)

## 2017-09-23 MED ORDER — VENLAFAXINE HCL ER 37.5 MG PO CP24: 37.5000 mg | ORAL_CAPSULE | Freq: Every day | ORAL | 3 refills | Status: DC

## 2017-09-23 MED ORDER — HYDROXYZINE HCL 25 MG PO TABS: 12.5000 mg | ORAL_TABLET | Freq: Three times a day (TID) | ORAL | 0 refills | Status: DC | PRN

## 2017-09-23 NOTE — Progress Notes (Signed)
Subjective:    Patient ID: Taylor Bautista, female    DOB: 02/16/1991, 27 y.o.   MRN: [id number]  HPI This is a 27 yo female who presents today with fatigue, has been having for 1.5 years after being diagnosed with chest congestion. Has not been sleeping well lately, goes to bed around 9, gets up around 5, has had recent awakening several times a night with difficulty going back to sleep. Has not tried anything for sleep. Does not want to take medication. 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 Occasional exercise.   Having intermittent chest pain, lasts for 30 seconds to a minute, feels like it takes her breath away, once awoke her from sleep over a year ago. Pain always on left side of chest, no radiation. Never with activity or exercise.  Resolve spontaneously or when she takes slow deep breaths.  She was seen in the ER for this before, EKG normal.  She has a long history of chronic migraines.  She did get relief when she had Botox injections during 1 of her pregnancies.  Last time she was seen at [facility] was told that it would be several $100 and she would have to pay out-of-pocket for Botox injections.  She states she cannot afford to do this right now nor can she afford to see a neurologist.  She thinks increased frequency of headaches is related to her increased stress. Currently with increased stress and anxiety, going through a difficult separation. Has 2 small children. Good family support.  She had restarted smoking but quit about a month ago.   Past Medical History:  Diagnosis Date  . Allergy   . Dysmenorrhea   . Dysthymic disorder   . Exposure to STD   . MRSA infection   . Tobacco use disorder    Past Surgical History:  Procedure Laterality Date  . Pigmented nevus removal  04/1989   Family History  Problem Relation Age of Onset  . Cancer Mother 3539       one breast  . Cancer Cousin 24       breast  . Anxiety disorder Other   .  Cancer Sister 2923       lymph node cancer twice  . Anxiety disorder Sister   . Depression Sister    Social History   Tobacco Use  . Smoking status: Former Smoker    Last attempt to quit: 04/13/2012    Years since quitting: 5.4  . Smokeless tobacco: Never Used  Substance Use Topics  . Alcohol use: No    Alcohol/week: 0.6 oz    Types: 1 Cans of beer per week  . 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 Negative for fever.  Respiratory: Negative for cough, chest tightness and shortness of breath.   Cardiovascular: Positive for chest pain (left sided). Negative for palpitations and leg swelling.  Gastrointestinal: Negative for abdominal pain, constipation and diarrhea.  Genitourinary: Negative for vaginal bleeding (on depo provera, no period).  Neurological: Positive for headaches.  Psychiatric/Behavioral: Positive for sleep disturbance. The patient is nervous/anxious.        Objective:   Physical Exam  Constitutional: She is oriented to person, place, and time. She appears well-developed and well-nourished. No distress.  HENT:  Head: Normocephalic.  Mouth/Throat: Oropharynx is clear and moist.  Neck: Normal range of motion. Neck supple.  Cardiovascular: Normal rate, regular rhythm and normal heart sounds.  Pulmonary/Chest: Effort normal and breath sounds normal. No respiratory distress. She has no wheezes. She has no rales. She exhibits no tenderness.  Musculoskeletal: She exhibits no edema.  Lymphadenopathy:    She has no cervical adenopathy.  Neurological: She is alert and oriented to person, place, and time.  Skin: Skin is warm and dry. She is not diaphoretic.  Psychiatric: She has a normal mood and affect. Her behavior is normal. Judgment and thought content normal.  Vitals reviewed.        BP 114/60   Pulse 75    Temp 98 F (36.7 C) (Oral)   Wt 139 lb 12 oz (63.4 kg)   SpO2 100%   BMI 21.89 kg/m  Wt Readings from Last 3 Encounters:  09/23/17 139 lb 12 oz (63.4 kg)  08/05/17 137 lb (62.1 kg)  02/12/17 150 lb (68 kg)   EKG- sinus arrhythmia, rate 75. Reviewed with Dr. Para March.  We compared to previous tracing.  Assessment & Plan:  1. Other chest pain -Likely related to increased stress and anxiety nonexertional - EKG 12-Lead  2. Weight loss - CBC with Differential - TSH - Comprehensive metabolic panel  3. Chronic fatigue - CBC with Differential - TSH - Comprehensive metabolic panel  4. Chronic migraine without aura without status migrainosus, not intractable - Offered referral to neurology but she declined at this time due to expense - She is interested in going on something for her anxiety and will put her on venlafaxine to see if this helps with her headaches as well - venlafaxine XR (EFFEXOR-XR) 37.5 MG 24 hr capsule; Take 1 capsule (37.5 mg total) by mouth daily with breakfast.  Dispense: 30 capsule; Refill: 3  5. Anxiety -She is not interested in therapy at this time - venlafaxine XR (EFFEXOR-XR) 37.5 MG 24 hr capsule; Take 1 capsule (37.5 mg total) by mouth daily with breakfast.  Dispense: 30 capsule; Refill: 3 - hydrOXYzine (ATARAX/VISTARIL) 25 MG tablet; Take 0.5-1 tablets (12.5-25 mg total) by mouth every 8 (eight) hours as needed for anxiety. Take 1/2 to 1 tablet every 8 hours as needed for anxiety or sleep  Dispense: 30 tablet; Refill: 0 Follow-up with PCP in 1-2 months, sooner if symptoms worsen-   Olean Ree, FNP-BC  [facility], [facility]  09/23/2017 1:46 PM

## 2017-09-23 NOTE — Patient Instructions (Addendum)
It was good to see you today  Please stop at the lab for blood work  I have sent in two medications to your pharmacy- hydroxyzine is for anxiety, it works quickly but may make you sleepy. You can take 1/2- 1 tablet every 8 hours as needed  I have sent in a daily medication called venlafaxine that you will take every day. It takes several weeks to build up in your system. It may also help with your migraines.  Please follow up with Rene Kocher in 6-8 weeks, or sooner if you are having worsening symptoms.  Venlafaxine extended-release capsules What is this medicine? VENLAFAXINE(VEN la fax een) is used to treat depression, anxiety and panic disorder. This medicine may be used for other purposes; ask your health care provider or pharmacist if you have questions. COMMON BRAND NAME(S): Effexor XR What should I tell my health care provider before I take this medicine? They need to know if you have any of these conditions: -bleeding disorders -glaucoma -heart disease -high blood pressure -high cholesterol -kidney disease -liver disease -low levels of sodium in the blood -mania or bipolar disorder -seizures -suicidal thoughts, plans, or attempt; a previous suicide attempt by you or a family -take medicines that treat or prevent blood clots -thyroid disease -an unusual or allergic reaction to venlafaxine, desvenlafaxine, other medicines, foods, dyes, or preservatives -pregnant or trying to get pregnant -breast-feeding How should I use this medicine? Take this medicine by mouth with a full glass of water. Follow the directions on the prescription label. Do not cut, crush, or chew this medicine. Take it with food. If needed, the capsule may be carefully opened and the entire contents sprinkled on a spoonful of cool applesauce. Swallow the applesauce/pellet mixture right away without chewing and follow with a glass of water to ensure complete swallowing of the pellets. Try to take your medicine at  about the same time each day. Do not take your medicine more often than directed. Do not stop taking this medicine suddenly except upon the advice of your doctor. Stopping this medicine too quickly may cause serious side effects or your condition may worsen. A special MedGuide will be given to you by the pharmacist with each prescription and refill. Be sure to read this information carefully each time. Talk to your pediatrician regarding the use of this medicine in children. Special care may be needed. Overdosage: If you think you have taken too much of this medicine contact a poison control center or emergency room at once. NOTE: This medicine is only for you. Do not share this medicine with others. What if I miss a dose? If you miss a dose, take it as soon as you can. If it is almost time for your next dose, take only that dose. Do not take double or extra doses. What may interact with this medicine? Do not take this medicine with any of the following medications: -certain medicines for fungal infections like fluconazole, itraconazole, ketoconazole, posaconazole, voriconazole -cisapride -desvenlafaxine -dofetilide -dronedarone -duloxetine -levomilnacipran -linezolid -MAOIs like Carbex, Eldepryl, Marplan, Nardil, and Parnate -methylene blue (injected into a vein) -milnacipran -pimozide -thioridazine -ziprasidone This medicine may also interact with the following medications: -amphetamines -aspirin and aspirin-like medicines -certain medicines for depression, anxiety, or psychotic disturbances -certain medicines for migraine headaches like almotriptan, eletriptan, frovatriptan, naratriptan, rizatriptan, sumatriptan, zolmitriptan -certain medicines for sleep -certain medicines that treat or prevent blood clots like dalteparin, enoxaparin, warfarin -cimetidine -clozapine -diuretics -fentanyl -furazolidone -indinavir -isoniazid -lithium -metoprolol -NSAIDS, medicines for pain  and  inflammation, like ibuprofen or naproxen -other medicines that prolong the QT interval (cause an abnormal heart rhythm) -procarbazine -rasagiline -supplements like St. John's wort, kava kava, valerian -tramadol -tryptophan This list may not describe all possible interactions. Give your health care provider a list of all the medicines, herbs, non-prescription drugs, or dietary supplements you use. Also tell them if you smoke, drink alcohol, or use illegal drugs. Some items may interact with your medicine. What should I watch for while using this medicine? Tell your doctor if your symptoms do not get better or if they get worse. Visit your doctor or health care professional for regular checks on your progress. Because it may take several weeks to see the full effects of this medicine, it is important to continue your treatment as prescribed by your doctor. Patients and their families should watch out for new or worsening thoughts of suicide or depression. Also watch out for sudden changes in feelings such as feeling anxious, agitated, panicky, irritable, hostile, aggressive, impulsive, severely restless, overly excited and hyperactive, or not being able to sleep. If this happens, especially at the beginning of treatment or after a change in dose, call your health care professional. This medicine can cause an increase in blood pressure. Check with your doctor for instructions on monitoring your blood pressure while taking this medicine. You may get drowsy or dizzy. Do not drive, use machinery, or do anything that needs mental alertness until you know how this medicine affects you. Do not stand or sit up quickly, especially if you are an older patient. This reduces the risk of dizzy or fainting spells. Alcohol may interfere with the effect of this medicine. Avoid alcoholic drinks. Your mouth may get dry. Chewing sugarless gum, sucking hard candy and drinking plenty of water will help. Contact your doctor if  the problem does not go away or is severe. What side effects may I notice from receiving this medicine? Side effects that you should report to your doctor or health care professional as soon as possible: -allergic reactions like skin rash, itching or hives, swelling of the face, lips, or tongue -anxious -breathing problems -confusion -changes in vision -chest pain -confusion -elevated mood, decreased need for sleep, racing thoughts, impulsive behavior -eye pain -fast, irregular heartbeat -feeling faint or lightheaded, falls -feeling agitated, angry, or irritable -hallucination, loss of contact with reality -high blood pressure -loss of balance or coordination -palpitations -redness, blistering, peeling or loosening of the skin, including inside the mouth -restlessness, pacing, inability to keep still -seizures -stiff muscles -suicidal thoughts or other mood changes -trouble passing urine or change in the amount of urine -trouble sleeping -unusual bleeding or bruising -unusually weak or tired -vomiting Side effects that usually do not require medical attention (report to your doctor or health care professional if they continue or are bothersome): -change in sex drive or performance -change in appetite or weight -constipation -dizziness -dry mouth -headache -increased sweating -nausea -tired This list may not describe all possible side effects. Call your doctor for medical advice about side effects. You may report side effects to FDA at 1-800-FDA-1088. Where should I keep my medicine? Keep out of the reach of children. Store at a controlled temperature between 20 and 25 degrees C (68 degrees and 77 degrees F), in a dry place. Throw away any unused medicine after the expiration date. NOTE: This sheet is a summary. It may not cover all possible information. If you have questions about this medicine, talk to your doctor,  pharmacist, or health care provider.  2018 Elsevier/Gold  Standard (2016-01-29 18:38:02)

## 2017-09-26 ENCOUNTER — Ambulatory Visit (INDEPENDENT_AMBULATORY_CARE_PROVIDER_SITE_OTHER): Payer: 59

## 2017-09-26 VITALS — BP 102/78 | HR 72

## 2017-09-26 DIAGNOSIS — Z3042 Encounter for surveillance of injectable contraceptive: Secondary | ICD-10-CM

## 2017-09-26 DIAGNOSIS — R300 Dysuria: Secondary | ICD-10-CM

## 2017-09-26 MED ORDER — MEDROXYPROGESTERONE ACETATE 150 MG/ML IM SUSP
150.0000 mg | Freq: Once | INTRAMUSCULAR | Status: AC
  Administered 2017-09-26: 150 mg via INTRAMUSCULAR

## 2017-09-26 NOTE — Progress Notes (Signed)
Patient presented to the office today for depo provera injection. Patient received injection in her left gluteal. Patient reported having some dysuria for a couple of days and is requesting a urine check. Urine was normal. I have advised patient I will culture her urine to see if it grows anything.  Patient voice understanding at this time.

## 2017-09-27 LAB — URINE CULTURE: ORGANISM ID, BACTERIA: NO GROWTH

## 2017-10-14 ENCOUNTER — Ambulatory Visit (INDEPENDENT_AMBULATORY_CARE_PROVIDER_SITE_OTHER): Payer: 59 | Admitting: Family Medicine

## 2017-10-14 ENCOUNTER — Encounter: Payer: Self-pay | Admitting: Family Medicine

## 2017-10-14 VITALS — BP 103/68 | HR 72 | Wt 142.0 lb

## 2017-10-14 DIAGNOSIS — Z113 Encounter for screening for infections with a predominantly sexual mode of transmission: Secondary | ICD-10-CM

## 2017-10-14 DIAGNOSIS — N898 Other specified noninflammatory disorders of vagina: Secondary | ICD-10-CM

## 2017-10-14 NOTE — Patient Instructions (Signed)

## 2017-10-14 NOTE — Progress Notes (Signed)
    Subjective:    Patient ID: Taylor Bautista is a 27 y.o. female presenting with Vaginal Discharge  on 10/14/2017  HPI: Thought she had a UTI--but urine culture negative. Notes some white vaginal discharge. Split with husband this summer and now back together. Denies fever, chills, N/V, abdominal pain. Notes significant odor with urinating. Taking a probiotic. Denies hesitancy, frequency or urgency or dysuria. Symptoms present x several weeks. Notes she is sensitive to soap changes.  Review of Systems  Constitutional: Negative for chills and fever.  Respiratory: Negative for shortness of breath.   Cardiovascular: Negative for chest pain.  Gastrointestinal: Negative for abdominal pain, nausea and vomiting.  Genitourinary: Positive for vaginal discharge. Negative for difficulty urinating and dysuria.  Skin: Negative for rash.      Objective:    BP 103/68   Pulse 72   Wt 142 lb (64.4 kg)   LMP  (LMP Unknown)   BMI 22.24 kg/m  Physical Exam  Constitutional: She is oriented to person, place, and time. She appears well-developed and well-nourished. No distress.  HENT:  Head: Normocephalic and atraumatic.  Eyes: No scleral icterus.  Neck: Neck supple.  Cardiovascular: Normal rate.  Pulmonary/Chest: Effort normal.  Abdominal: Soft.  Genitourinary:  Genitourinary Comments: BUS normal, vagina is pink and rugated, thin white discharge noted, cervix is multiparous without lesion, uterus is small and anteverted, no adnexal mass or tenderness.   Neurological: She is alert and oriented to person, place, and time.  Skin: Skin is warm and dry.  Psychiatric: She has a normal mood and affect.        Assessment & Plan:  Vaginal discharge - check pelvic cultures, declines HIV, RPR and treat accordingly--if negative consider treatment for less common bacterial infections. - Plan: Cervicovaginal ancillary only   Total face-to-face time with patient: 10 minutes. Over 50% of encounter was  spent on counseling and coordination of care.  Reva Boresanya S Roselyne Stalnaker 10/14/2017 8:55 AM

## 2017-10-14 NOTE — Progress Notes (Signed)
Declined flu at this time 

## 2017-10-17 ENCOUNTER — Other Ambulatory Visit: Payer: Self-pay

## 2017-10-17 LAB — CERVICOVAGINAL ANCILLARY ONLY
BACTERIAL VAGINITIS: POSITIVE — AB
CHLAMYDIA, DNA PROBE: NEGATIVE
Candida vaginitis: NEGATIVE
Neisseria Gonorrhea: NEGATIVE
Trichomonas: NEGATIVE

## 2017-10-17 MED ORDER — METRONIDAZOLE 500 MG PO TABS: 500.0000 mg | ORAL_TABLET | Freq: Two times a day (BID) | ORAL | 0 refills | Status: DC

## 2017-10-17 NOTE — Telephone Encounter (Signed)
Patient has BV will call in Flagyl

## 2017-11-04 ENCOUNTER — Ambulatory Visit: Payer: 59 | Admitting: Internal Medicine

## 2017-11-04 DIAGNOSIS — Z0289 Encounter for other administrative examinations: Secondary | ICD-10-CM

## 2017-11-04 NOTE — Progress Notes (Deleted)
Subjective:    Patient ID: Taylor Bautista, female    DOB: 04/02/1991, 27 y.o.   MRN: [id number]  HPI  Pt presents to the clinic today to follow up anxiety and depression. She reports increase is social stress, saw Deboraha SprangDebbie Gessner 09/2017 for the same. She was started on Effexor and Hydroxyzine, in addition to prn Xanax. Since that time, she reports.  Review of Systems      Past Medical History:  Diagnosis Date  . Allergy   . Dysmenorrhea   . Dysthymic disorder   . Exposure to STD   . MRSA infection   . Tobacco use disorder     Current Outpatient Medications  Medication Sig Dispense Refill  . hydrOXYzine (ATARAX/VISTARIL) 25 MG tablet Take 0.5-1 tablets (12.5-25 mg total) by mouth every 8 (eight) hours as needed for anxiety. Take 1/2 to 1 tablet every 8 hours as needed for anxiety or sleep (Patient not taking: Reported on 09/26/2017) 30 tablet 0  . medroxyPROGESTERone (DEPO-PROVERA) 150 MG/ML injection Inject 1 mL (150 mg total) into the muscle every 3 (three) months. 1 mL 1  . metroNIDAZOLE (FLAGYL) 500 MG tablet Take 1 tablet (500 mg total) by mouth 2 (two) times daily. 14 tablet 0  . SUMAtriptan (IMITREX) 100 MG tablet Take 1 tablet (100 mg total) by mouth once as needed for migraine. May repeat in 2 hours if headache persists or recurs. (Patient not taking: Reported on 09/26/2017) 10 tablet 2  . venlafaxine XR (EFFEXOR-XR) 37.5 MG 24 hr capsule Take 1 capsule (37.5 mg total) by mouth daily with breakfast. 30 capsule 3   No current facility-administered medications for this visit.     No Known Allergies  Family History  Problem Relation Age of Onset  . Cancer Mother [id number]       one breast  . Cancer Cousin 24       breast  . Anxiety disorder Other   . Cancer Sister [id number]       lymph node cancer twice  . Anxiety disorder Sister   . Depression Sister     Social History   Socioeconomic History  . Marital status: Divorced    Spouse name: Not on file  . Number of children:  Not on file  . Years of education: Not on file  . Highest education level: Not on file  Social Needs  . Financial resource strain: Not on file  . Food insecurity - worry: Not on file  . Food insecurity - inability: Not on file  . Transportation needs - medical: Not on file  . Transportation needs - non-medical: Not on file  Occupational History  . Not on file  Tobacco Use  . Smoking status: Former Smoker    Last attempt to quit: 04/13/2012    Years since quitting: 5.5  . Smokeless tobacco: Never Used  Substance and Sexual Activity  . Alcohol use: No    Alcohol/week: 0.6 oz    Types: 1 Cans of beer per week  . 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 Sexual activity: Not Currently    Partners: Male    Birth control/protection: IUD  Other Topics Concern  . Not on file  Social History Narrative   Lives with mother      Works at [employer]           Constitutional: Denies fever, malaise, fatigue, headache or abrupt weight changes.  HEENT: Denies eye pain, eye redness, ear pain, ringing in the ears, wax buildup, runny nose, nasal congestion, bloody nose, or sore throat. Respiratory: Denies difficulty breathing, shortness of breath, cough or sputum production.   Cardiovascular: Denies chest pain, chest tightness, palpitations or swelling in the hands or feet.  Gastrointestinal: Denies abdominal pain, bloating, constipation, diarrhea or blood in the stool.  GU: Denies urgency, frequency, pain with urination, burning sensation, blood in urine, odor or discharge. Musculoskeletal: Denies decrease in range of motion, difficulty with gait, muscle pain or joint pain and swelling.  Skin: Denies redness, rashes, lesions or ulcercations.  Neurological: Denies dizziness, difficulty with memory, difficulty with speech or problems with balance and coordination.  Psych: Pt reports anxiety and depression. Denies SI/HI.  No other  specific complaints in a complete review of systems (except as listed in HPI above).  Objective:   Physical Exam        Assessment & Plan:

## 2017-11-22 ENCOUNTER — Encounter: Payer: Self-pay | Admitting: Obstetrics and Gynecology

## 2017-11-22 ENCOUNTER — Ambulatory Visit (INDEPENDENT_AMBULATORY_CARE_PROVIDER_SITE_OTHER): Payer: 59 | Admitting: Obstetrics and Gynecology

## 2017-11-22 VITALS — BP 110/80 | HR 72 | Wt 143.2 lb

## 2017-11-22 DIAGNOSIS — N898 Other specified noninflammatory disorders of vagina: Secondary | ICD-10-CM | POA: Diagnosis not present

## 2017-11-22 NOTE — Progress Notes (Signed)
[facility] 11/22/2017  CC: vaginal discharge  Subjective:  S/s persisted despite po flagyl given at last visit in early February, which she had a hard time tolerating. Some irritation, no smell or dysuria  Objective:    BP 110/80   Pulse 72   Wt 143 lb 3.2 oz (65 kg)   BMI 22.43 kg/m   NAD EGBUS normal Mild amount of white vaginal discharge.  Assessment:    possible bv   Plan:  Swab sent again. If still +, can try metrogel  RTC PRN  Taylor Bautista, Jr MD Attending Center [facility](Faculty Practice)

## 2017-11-23 LAB — CERVICOVAGINAL ANCILLARY ONLY
BACTERIAL VAGINITIS: NEGATIVE
CANDIDA VAGINITIS: NEGATIVE

## 2017-12-26 ENCOUNTER — Ambulatory Visit: Payer: 59 | Admitting: Primary Care

## 2017-12-27 ENCOUNTER — Telehealth: Payer: Self-pay | Admitting: Internal Medicine

## 2017-12-27 ENCOUNTER — Ambulatory Visit (INDEPENDENT_AMBULATORY_CARE_PROVIDER_SITE_OTHER): Payer: 59 | Admitting: Family Medicine

## 2017-12-27 ENCOUNTER — Encounter: Payer: Self-pay | Admitting: Family Medicine

## 2017-12-27 VITALS — BP 118/64 | HR 89 | Temp 98.7°F | Wt 142.5 lb

## 2017-12-27 DIAGNOSIS — F17200 Nicotine dependence, unspecified, uncomplicated: Secondary | ICD-10-CM | POA: Diagnosis not present

## 2017-12-27 DIAGNOSIS — N898 Other specified noninflammatory disorders of vagina: Secondary | ICD-10-CM

## 2017-12-27 MED ORDER — METRONIDAZOLE 0.75 % VA GEL: 1.0000 | Freq: Two times a day (BID) | VAGINAL | 0 refills | Status: DC

## 2017-12-27 MED ORDER — METRONIDAZOLE 1 % EX GEL: Freq: Every day | CUTANEOUS | 0 refills | Status: DC

## 2017-12-27 NOTE — Patient Instructions (Signed)
Possible BV - treat with metrogel Let us know if ongoing symptoms after treatment. We will be in touch with testing results today.

## 2017-12-27 NOTE — Progress Notes (Signed)
BP 118/64 (BP Location: Left Arm, Patient Position: Sitting, Cuff Size: Normal)   Pulse 89   Temp 98.7 F (37.1 C) (Oral)   Wt 142 lb 8 oz (64.6 kg)   SpO2 94%   BMI 22.32 kg/m    CC: vag discharge Subjective:    Patient ID: Taylor Bautista, female    DOB: Nov 20, 1990, 27 y.o.   MRN: [id number]  HPI: Taylor Bautista is a 27 y.o. female presenting on 12/27/2017 for Vaginal Discharge (Having discharge with odor. Also, c/o lump on outside of labia under the skin. H/o bacterial inf. Seen by OB/GYN about 3 wks ago when sxs first started. )   Several week h/o vag discharge with odd smell. Occasional burning sensation.  Also noted lump L vulvar region initially more swollen and tender, swelling is improving.  Some lower back discomfort over the past week. Noticing increasing dyspareunia over last several weeks as well.   No fevers/chills, rashes, urinary symptoms, urgency/frequency/dysuria or hematuria. No abd pain or flank pain.   She does get waxed, not recently.  No new products, lotions, detergents, soaps or shampoos.  Married - monogamous relationship with husband.   Saw OB 3 wks ago when symptoms started - states normal testing at that time. No meds at that time. Symptoms are progressively worsening.   H/o BV, treated with flagyl 10/2017 - symptoms persisted despite treatment.  Negative testing 11/2017.   Restarted smoking - 2-3 cig/day.   LMP - doesn't regularly get periods as she's on depo provera.  Relevant past medical, surgical, family and social history reviewed and updated as indicated. Interim medical history since our last visit reviewed. Allergies and medications reviewed and updated. Outpatient Medications Prior to Visit  Medication Sig Dispense Refill  . medroxyPROGESTERone (DEPO-PROVERA) 150 MG/ML injection Inject 1 mL (150 mg total) into the muscle every 3 (three) months. 1 mL 1  . hydrOXYzine (ATARAX/VISTARIL) 25 MG tablet Take 0.5-1 tablets (12.5-25 mg total)  by mouth every 8 (eight) hours as needed for anxiety. Take 1/2 to 1 tablet every 8 hours as needed for anxiety or sleep (Patient not taking: Reported on 09/26/2017) 30 tablet 0  . metroNIDAZOLE (FLAGYL) 500 MG tablet Take 1 tablet (500 mg total) by mouth 2 (two) times daily. (Patient not taking: Reported on 11/22/2017) 14 tablet 0  . SUMAtriptan (IMITREX) 100 MG tablet Take 1 tablet (100 mg total) by mouth once as needed for migraine. May repeat in 2 hours if headache persists or recurs. (Patient not taking: Reported on 09/26/2017) 10 tablet 2  . venlafaxine XR (EFFEXOR-XR) 37.5 MG 24 hr capsule Take 1 capsule (37.5 mg total) by mouth daily with breakfast. (Patient not taking: Reported on 11/22/2017) 30 capsule 3   No facility-administered medications prior to visit.      Per HPI unless specifically indicated in ROS section below Review of Systems     Objective:    BP 118/64 (BP Location: Left Arm, Patient Position: Sitting, Cuff Size: Normal)   Pulse 89   Temp 98.7 F (37.1 C) (Oral)   Wt 142 lb 8 oz (64.6 kg)   SpO2 94%   BMI 22.32 kg/m   Wt Readings from Last 3 Encounters:  12/27/17 142 lb 8 oz (64.6 kg)  11/22/17 143 lb 3.2 oz (65 kg)  10/14/17 142 lb (64.4 kg)    Physical Exam  Constitutional: She appears well-developed and well-nourished. No distress.  Abdominal: Soft. Bowel sounds are normal. She exhibits no distension and  no mass. There is no tenderness. There is no rebound and no guarding. No hernia. Hernia confirmed negative in the right inguinal area and confirmed negative in the left inguinal area.  Genitourinary:    Pelvic exam was performed with patient supine. There is no rash, tenderness, lesion or injury on the right labia. There is lesion on the left labia. There is no rash, tenderness or injury on the left labia. Cervix exhibits no discharge and no friability. No erythema or bleeding in the vagina. No signs of injury around the vagina. Vaginal discharge (mild white)  found.  Genitourinary Comments: Mild induration of skin L lower vulva without fluctuance Mild irritation at cervical os  Lymphadenopathy: No inguinal adenopathy noted on the right or left side.  Nursing note and vitals reviewed.  Results for orders placed or performed in visit on 11/22/17  Cervicovaginal ancillary only  Result Value Ref Range   Bacterial vaginitis Negative for Bacterial Vaginitis Microorganisms    Candida vaginitis Negative for Candida species       Assessment & Plan:  She will return for CPE.  Problem List Items Addressed This Visit    TOBACCO USE    Encouraged full cessation.       Vaginal discharge - Primary    Ongoing symptoms, treated 10/2017 for BV with 7d flagyl course. Possible recurrence - will send wet prep and CT/GC. Rx metrogel. Update if recurrent or not improving with treatment. Pt agrees with plan.       Relevant Orders   C. trachomatis/N. gonorrhoeae RNA   Wet Prep for Trick, Yeast, Clue       Meds ordered this encounter  Medications  . metroNIDAZOLE (METROGEL) 1 % gel    Sig: Apply topically daily.    Dispense:  60 g    Refill:  0   Orders Placed This Encounter  Procedures  . C. trachomatis/N. gonorrhoeae RNA  . Wet Prep for Trick, Yeast, Clue    Follow up plan: Return if symptoms worsen or fail to improve.  Eustaquio BoydenJavier Tacari Repass, MD

## 2017-12-27 NOTE — Telephone Encounter (Signed)
Copied from CRM [phone]. Topic: Quick Communication - See Telephone Encounter >> Dec 27, 2017  3:42 PM Oneal GroutSebastian, Jennifer S wrote: CRM for notification. See Telephone encounter for: 12/27/17. Insurance will not cover metroNIDAZOLE (METROGEL) 1 % gel, will cover .75%. Can this be wrote for that dosage? Please advise. [facility] [address]

## 2017-12-27 NOTE — Assessment & Plan Note (Signed)
Ongoing symptoms, treated 10/2017 for BV with 7d flagyl course. Possible recurrence - will send wet prep and CT/GC. Rx metrogel. Update if recurrent or not improving with treatment. Pt agrees with plan.

## 2017-12-27 NOTE — Assessment & Plan Note (Signed)
Encouraged full cessation.  ?

## 2017-12-27 NOTE — Telephone Encounter (Signed)
Sent in 0.75% gel

## 2018-01-02 ENCOUNTER — Telehealth (INDEPENDENT_AMBULATORY_CARE_PROVIDER_SITE_OTHER): Payer: 59 | Admitting: Internal Medicine

## 2018-01-02 DIAGNOSIS — R300 Dysuria: Secondary | ICD-10-CM

## 2018-01-02 LAB — WET PREP BY MOLECULAR PROBE
Candida species: NOT DETECTED
MICRO NUMBER: 90467060
SPECIMEN QUALITY: ADEQUATE
TRICHOMONAS VAG: NOT DETECTED

## 2018-01-02 LAB — C. TRACHOMATIS/N. GONORRHOEAE RNA
C. trachomatis RNA, TMA: NOT DETECTED
N. gonorrhoeae RNA, TMA: NOT DETECTED

## 2018-01-02 LAB — WET PREP FOR TRICH, YEAST, CLUE

## 2018-01-02 NOTE — Telephone Encounter (Signed)
Yes ok to come in and drop off a urine. Let me know results.

## 2018-01-02 NOTE — Telephone Encounter (Signed)
Returned pt's call.  States she finished the cream.  But now feels the need to urinate often and has a little burning with urination.  Thinks she may have a bladder infection.  Pt gives permission to lvm with Dr. Timoteo ExposeG's response.  Also, pt was given lab results.

## 2018-01-02 NOTE — Telephone Encounter (Signed)
Spoke with pt relaying message per Dr. Reece AgarG.  Says she will be by to leave sample.

## 2018-01-02 NOTE — Telephone Encounter (Signed)
Copied from CRM [phone]. Topic: Quick Communication - See Telephone Encounter >> Jan 02, 2018 10:26 AM Cipriano BunkerLambe, Annette S wrote: CRM for notification.   Pt. Was in last Tuesday 4/16 and was given gel and no odor now but feels like a UTI.  Wanting to know if can just come by and give urine sample as there is no openings. Did not get results from last week.   Please call pt.   See Telephone encounter for: 01/02/18.

## 2018-01-03 LAB — POC URINALSYSI DIPSTICK (AUTOMATED)
Bilirubin, UA: NEGATIVE
Glucose, UA: NEGATIVE
KETONES UA: NEGATIVE
Leukocytes, UA: NEGATIVE
Nitrite, UA: NEGATIVE
PROTEIN UA: NEGATIVE
RBC UA: NEGATIVE
SPEC GRAV UA: 1.025 (ref 1.010–1.025)
Urobilinogen, UA: 0.2 E.U./dL
pH, UA: 6 (ref 5.0–8.0)

## 2018-01-03 NOTE — Telephone Encounter (Signed)
Pt left urine sample today.  Ran sample and sent Dr. Reece AgarG the results.

## 2018-01-03 NOTE — Telephone Encounter (Signed)
Spoke with pt relaying results and message per Dr. G.  Pt verbalizes understanding. 

## 2018-01-03 NOTE — Telephone Encounter (Signed)
plz notify urine test returned normal - no signs of infection. rec increase water intake, avoid bladder irritants like dark sodas, spicy foods, caffeine.

## 2018-01-09 ENCOUNTER — Ambulatory Visit: Payer: Self-pay

## 2018-01-09 ENCOUNTER — Ambulatory Visit (INDEPENDENT_AMBULATORY_CARE_PROVIDER_SITE_OTHER): Payer: 59 | Admitting: Family

## 2018-01-09 ENCOUNTER — Encounter: Payer: Self-pay | Admitting: Family

## 2018-01-09 ENCOUNTER — Other Ambulatory Visit (INDEPENDENT_AMBULATORY_CARE_PROVIDER_SITE_OTHER): Payer: 59

## 2018-01-09 ENCOUNTER — Ambulatory Visit (INDEPENDENT_AMBULATORY_CARE_PROVIDER_SITE_OTHER)
Admission: RE | Admit: 2018-01-09 | Discharge: 2018-01-09 | Disposition: A | Discharge status: Home / Self Care | Payer: 59 | Source: Ambulatory Visit | Attending: Family | Admitting: Family

## 2018-01-09 VITALS — BP 104/76 | HR 79 | Temp 98.2°F | Ht 67.0 in | Wt 148.0 lb

## 2018-01-09 DIAGNOSIS — R5383 Other fatigue: Secondary | ICD-10-CM | POA: Diagnosis not present

## 2018-01-09 DIAGNOSIS — R079 Chest pain, unspecified: Secondary | ICD-10-CM

## 2018-01-09 NOTE — Telephone Encounter (Signed)
Phone call from pt. With c/o chest pain.  Describe pain in anterior left chest that "feels like chest is having in at times and hard to breathe."  Rated at 7/10; denied radiation of pain today.  Stated it will radiate to left shoulder or down into the stomach region.  Reported the chest pain lasts approx. 5 minutes at a time.  Stated this has been occurring for awhile, but because she feels so restless, she called the office.  Reported unable to sleep well for approx. 2 mos.  Stated she is "mentally and physically exhaused."  Reported she was prescribed medication for anxiety, that has not been effective; stopped the anti-anxiety medication on her own.  Advised needs to be evaluated today.  No appts. avail. At PCP office.  Advised to go to the ER.  Agreed w/ plan; stated her mother will take her.        Reason for Disposition . Chest pain lasts > 5 minutes (Exceptions: chest pain occurring > 3 days ago and now asymptomatic; same as previously diagnosed heartburn and has accompanying sour taste in mouth)    Reported intermittent chest pain lasting about 5 minutes with shortness of breath.  Rated pain today at 7/10.  Able to speak in full sentences and articulate her symptoms clearly.  Answer Assessment - Initial Assessment Questions 1. LOCATION: "Where does it hurt?"       Feels on front left chest ; feels like chest is caving- in; hard to breathe   2. RADIATION: "Does the pain go anywhere else?" (e.g., into neck, jaw, arms, back)     Radiates to left shoulder and sometime the stomach  3. ONSET: "When did the chest pain begin?" (Minutes, hours or days)      Has had it awhile; now feels restless, lack of energy; poor sleep pattern  4. PATTERN "Does the pain come and go, or has it been constant since it started?"  "Does it get worse with exertion?"      Comes and goes; lasts about 5 min.; 2-3 times/ day  5. DURATION: "How long does it last" (e.g., seconds, minutes, hours)     5 minutes 6. SEVERITY:  "How bad is the pain?"  (e.g., Scale 1-10; mild, moderate, or severe)    - MILD (1-3): doesn't interfere with normal activities     - MODERATE (4-7): interferes with normal activities or awakens from sleep    - SEVERE (8-10): excruciating pain, unable to do any normal activities      7/10 today; stated it can vary from 3/10-6/10-7/10 7. CARDIAC RISK FACTORS: "Do you have any history of heart problems or risk factors for heart disease?" (e.g., prior heart attack, angina; high blood pressure, diabetes, being overweight, high cholesterol, smoking, or strong family history of heart disease)     No diagnosis of cardiac issues; told it was anxiety; denies diabetes, high BP, high cholesterol, being overweight, or family hx of heart disease.   8. PULMONARY RISK FACTORS: "Do you have any history of lung disease?"  (e.g., blood clots in lung, asthma, emphysema, birth control pills)     Denied hx PE, asthma, or emphysema; or birth control pills  9. CAUSE: "What do you think is causing the chest pain?"     Unknown 10. OTHER SYMPTOMS: "Do you have any other symptoms?" (e.g., dizziness, nausea, vomiting, sweating, fever, difficulty breathing, cough)      Denies dizziness, nausea, vomiting; admits to breaking out in a sweat at times.;  gets short of breath when walking up stairs 11. PREGNANCY: "Is there any chance you are pregnant?" "When was your last menstrual period?"       LMP about 2 years ago; gets hormone injection every 3 mos.  Protocols used: CHEST PAIN-A-AH

## 2018-01-09 NOTE — Progress Notes (Signed)
Taylor Bautista is a 27 y.o. female with the following history as recorded in [employer]:  Patient Active Problem List   Diagnosis Date Noted  . Vaginal discharge 10/19/2016  . Episodic paroxysmal anxiety disorder 12/26/2014  . Migraine without aura 12/05/2012  . TOBACCO USE 09/25/2007  . Allergic rhinitis 03/20/2007    Current Outpatient Medications  Medication Sig Dispense Refill  . medroxyPROGESTERone (DEPO-PROVERA) 150 MG/ML injection Inject 1 mL (150 mg total) into the muscle every 3 (three) months. 1 mL 1   No current facility-administered medications for this visit.     Allergies: Patient has no known allergies.  Past Medical History:  Diagnosis Date  . Allergy   . Dysmenorrhea   . Dysthymic disorder   . Exposure to STD   . MRSA infection   . Tobacco use disorder     Past Surgical History:  Procedure Laterality Date  . Pigmented nevus removal  04/1989    Family History  Problem Relation Age of Onset  . Cancer Mother 30       one breast  . Cancer Cousin 24       breast  . Anxiety disorder Other   . Cancer Sister 43       lymph node cancer twice  . Anxiety disorder Sister   . Depression Sister     Social History   Tobacco Use  . Smoking status: Former Smoker    Last attempt to quit: 04/13/2012    Years since quitting: 5.7  . Smokeless tobacco: Never Used  Substance Use Topics  . Alcohol use: No    Alcohol/week: 0.6 oz    Types: 1 Cans of beer per week    Subjective:  Patient presents with concerns for recurrent episodes of chest pain; notes that symptoms have been present for the past 2 years; worse in the past 6-7 months; was seen by her PCP in January with similar symptoms- felt to be anxiety and stress related; Was given Rx for Effexor and Hydroxyzine in January- did not feel these were beneficial; Does work at [employer]- has been there for 7 years; job does involve heavy lifting; on Depo-Provera x 2 years with no side effects;  admits to feeling "very  restless"- admits that there could be some anxiety; admits that she is not sleeping well/ has not slept well for 4-5 months; has been told that she snores/ "heavy breather" at night; no prior of asthma/ GERD; does repeatedly mention that Xanax has been beneficial for her sleep in the past but that her current PCPs office will not prescribe this medication for her;   Objective:  Vitals:   01/09/18 1556  BP: 104/76  Pulse: 79  Temp: 98.2 F (36.8 C)  TempSrc: Oral  SpO2: 98%  Weight: 148 lb (67.1 kg)  Height:  (1.702 m)    General: Well developed, well nourished, in no acute distress  Skin : Warm and dry.  Head: Normocephalic and atraumatic  Oropharynx: Pink, supple. No suspicious lesions  Neck: Supple without thyromegaly, adenopathy  Lungs: Respirations unlabored; clear to auscultation bilaterally without wheeze, rales, rhonchi  CVS exam: normal rate and regular rhythm.  Vessels: Symmetric bilaterally  Neurologic: Alert and oriented; speech intact; face symmetrical; moves all extremities well; CNII-XII intact without focal deficit  Assessment:  1. Chest pain, unspecified type   2. Other fatigue     Plan:  CXR today show no acute changes- same findings as EKG in January 2019; update CXR today  due to history of smoking; will update labs; suspect anxiety is causing majority of symptoms; chronic sleep issues are present- not sure if they are caused by the anxiety or worsened because of the anxiety; patient agrees and is interested in seeing sleep specialist/ discussing sleep study; she also requests for a referral to psychiatrist; work note for today and tomorrow as requested.   No follow-ups on file.  Orders Placed This Encounter  Procedures  . DG Chest 2 View    Standing Status:   Future    Number of Occurrences:   1    Standing Expiration Date:   03/12/2019    Order Specific Question:   Reason for Exam (SYMPTOM  OR DIAGNOSIS REQUIRED)    Answer:   atypical chest pain    Order  Specific Question:   Is patient pregnant?    Answer:   No    Order Specific Question:   Preferred imaging location?    Answer:   [facility]    Order Specific Question:   Radiology Contrast Protocol - do NOT remove file path    Answer:   [email or web address]  . B12    Standing Status:   Future    Number of Occurrences:   1    Standing Expiration Date:   01/09/2019  . Vitamin D (25 hydroxy)    Standing Status:   Future    Number of Occurrences:   1    Standing Expiration Date:   01/09/2019  . D-Dimer, Quantitative    Standing Status:   Future    Number of Occurrences:   1    Standing Expiration Date:   01/09/2019  . Ambulatory referral to Pulmonology    Referral Priority:   Routine    Referral Type:   Consultation    Referral Reason:   Specialty Services Required    Requested Specialty:   Pulmonary Disease    Number of Visits Requested:   1  . Ambulatory referral to Psychiatry    Referral Priority:   Routine    Referral Type:   Psychiatric    Referral Reason:   Specialty Services Required    Requested Specialty:   Psychiatry    Number of Visits Requested:   1  . EKG 12-Lead    Requested Prescriptions    No prescriptions requested or ordered in this encounter

## 2018-01-09 NOTE — Telephone Encounter (Signed)
Noted, agree with advice given 

## 2018-01-10 LAB — VITAMIN D 25 HYDROXY (VIT D DEFICIENCY, FRACTURES): VITD: 47.47 ng/mL (ref 30.00–100.00)

## 2018-01-10 LAB — VITAMIN B12: Vitamin B-12: 141 pg/mL — ABNORMAL LOW (ref 211–911)

## 2018-01-10 LAB — D-DIMER, QUANTITATIVE (NOT AT ARMC): D DIMER QUANT: 0.55 ug{FEU}/mL — AB (ref <0.50)

## 2018-01-11 ENCOUNTER — Other Ambulatory Visit: Payer: Self-pay | Admitting: Family

## 2018-01-11 DIAGNOSIS — R7989 Other specified abnormal findings of blood chemistry: Secondary | ICD-10-CM

## 2018-01-12 ENCOUNTER — Encounter: Payer: Self-pay | Admitting: Internal Medicine

## 2018-01-12 ENCOUNTER — Ambulatory Visit (INDEPENDENT_AMBULATORY_CARE_PROVIDER_SITE_OTHER): Payer: 59 | Admitting: Internal Medicine

## 2018-01-12 DIAGNOSIS — E538 Deficiency of other specified B group vitamins: Secondary | ICD-10-CM | POA: Diagnosis not present

## 2018-01-12 MED ORDER — CYANOCOBALAMIN 1000 MCG/ML IJ SOLN
1000.0000 ug | Freq: Once | INTRAMUSCULAR | Status: AC
  Administered 2018-01-12: 1000 ug via INTRAMUSCULAR

## 2018-01-12 NOTE — Patient Instructions (Signed)
Please start of vitamin B12 under your tongue every day. If you are feeling okay, you should have your vitamin B12 levels checked in about 3 months. If not, we should continue injections every month or so. You may have pernicious anemia.

## 2018-01-12 NOTE — Progress Notes (Signed)
Subjective:    Patient ID: Taylor Bautista, female    DOB: March 20, 1991, 27 y.o.   MRN: [id number]  HPI Here due to fatigue and low B12  Has had chest pain (like it was caving in)--fingers numb ER visit 2 years ago for this The chest pain has been intermittent since then and worsening in the past 2 months Now more exhausted  No autoimmune diseases in family  Current Outpatient Medications on File Prior to Visit  Medication Sig Dispense Refill  . medroxyPROGESTERone (DEPO-PROVERA) 150 MG/ML injection Inject 1 mL (150 mg total) into the muscle every 3 (three) months. 1 mL 1   No current facility-administered medications on file prior to visit.     No Known Allergies  Past Medical History:  Diagnosis Date  . Allergy   . Dysmenorrhea   . Dysthymic disorder   . Exposure to STD   . MRSA infection   . Tobacco use disorder     Past Surgical History:  Procedure Laterality Date  . Pigmented nevus removal  04/1989    Family History  Problem Relation Age of Onset  . Cancer Mother 61       one breast  . Cancer Cousin 24       breast  . Anxiety disorder Other   . Cancer Sister 54       lymph node cancer twice  . Anxiety disorder Sister   . Depression Sister     Social History   Socioeconomic History  . Marital status: Divorced    Spouse name: Not on file  . Number of children: Not on file  . Years of education: Not on file  . Highest education level: Not on file  Occupational History  . Not on file  Social Needs  . Financial resource strain: Not on file  . Food insecurity:    Worry: Not on file    Inability: Not on file  . Transportation needs:    Medical: Not on file    Non-medical: Not on file  Tobacco Use  . Smoking status: Former Smoker    Last attempt to quit: 04/13/2012    Years since quitting: 5.7  . Smokeless tobacco: Never Used  Substance and Sexual Activity  . Alcohol use: No    Alcohol/week: 0.6 oz    Types: 1 Cans of beer per week  . 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 Sexual activity: Not Currently    Partners: Male    Birth control/protection: IUD  Lifestyle  . Physical activity:    Days per week: Not on file    Minutes per session: Not on file  . Stress: Not on file  Relationships  . Social connections:    Talks on phone: Not on file    Gets together: Not on file    Attends religious service: Not on file    Active member of club or organization: Not on file    Attends meetings of clubs or organizations: Not on file    Relationship status: Not on file  . Intimate partner violence:    Fear of current or ex partner: Not on file    Emotionally abused: Not on file    Physically abused: Not on file    Forced sexual activity: Not on file  Other Topics Concern  . Not on file  Social History Narrative   Lives with mother  Works at [employer]         Review of Systems  Trouble sleeping---is going for a sleep study Frequent gas pains No medications--never took PPI, etc No prior intestinal surgey     Objective:   Physical Exam  Constitutional: She appears well-developed. No distress.          Assessment & Plan:

## 2018-01-12 NOTE — Assessment & Plan Note (Signed)
Not clear if this is true PA Shouldn't be dietary --eats meat.  No gastric surgery of acid reducing meds Will give IM B12 now Have her try sublingual B12 and recheck level in 2-3 months to see if she can continue SL Rx

## 2018-01-12 NOTE — Addendum Note (Signed)
Addended by: Eual Fines on: 01/12/2018 04:21 PM   Modules accepted: Orders

## 2018-01-13 ENCOUNTER — Ambulatory Visit (INDEPENDENT_AMBULATORY_CARE_PROVIDER_SITE_OTHER)
Admission: RE | Admit: 2018-01-13 | Discharge: 2018-01-13 | Disposition: A | Discharge status: Home / Self Care | Payer: 59 | Source: Ambulatory Visit | Attending: Family | Admitting: Family

## 2018-01-13 DIAGNOSIS — R7989 Other specified abnormal findings of blood chemistry: Secondary | ICD-10-CM | POA: Diagnosis not present

## 2018-01-13 MED ORDER — IOPAMIDOL (ISOVUE-370) INJECTION 76%
75.0000 mL | Freq: Once | INTRAVENOUS | Status: AC | PRN
  Administered 2018-01-13: 75 mL via INTRAVENOUS

## 2018-01-18 NOTE — Progress Notes (Deleted)
Imaging personally reviewed; CT chest 01/13/2018; normal lungs.

## 2018-01-19 ENCOUNTER — Institutional Professional Consult (permissible substitution): Payer: 59 | Admitting: Internal Medicine

## 2018-01-25 ENCOUNTER — Encounter: Payer: Self-pay | Admitting: Family Medicine

## 2018-01-25 ENCOUNTER — Ambulatory Visit (INDEPENDENT_AMBULATORY_CARE_PROVIDER_SITE_OTHER): Payer: 59 | Admitting: Family Medicine

## 2018-01-25 VITALS — BP 110/70 | HR 95 | Temp 98.2°F | Ht 67.0 in | Wt 150.0 lb

## 2018-01-25 DIAGNOSIS — R6884 Jaw pain: Secondary | ICD-10-CM

## 2018-01-25 DIAGNOSIS — H9202 Otalgia, left ear: Secondary | ICD-10-CM | POA: Diagnosis not present

## 2018-01-25 DIAGNOSIS — N926 Irregular menstruation, unspecified: Secondary | ICD-10-CM | POA: Diagnosis not present

## 2018-01-25 LAB — POCT URINE PREGNANCY: Preg Test, Ur: NEGATIVE

## 2018-01-25 MED ORDER — MELOXICAM 15 MG PO TABS: 15.0000 mg | ORAL_TABLET | Freq: Every day | ORAL | 0 refills | Status: DC | PRN

## 2018-01-25 NOTE — Patient Instructions (Signed)
Please try sudafed twice a day for 3-5 days to see if it helps the pressure  I have sent in a prescription strength anti-inflammatory to take once a day as needed

## 2018-01-25 NOTE — Progress Notes (Signed)
Subjective:    Patient ID: Taylor Bautista, female    DOB: [birth year], 27 y.o.   MRN: [id number]  HPI This is a 27 yo female who presents today with left sided ear pain and jaw pain x 3 days. No fever. Some itchy eyes and runny nose. No cough. Has not taken anything for him. Wears retainers at night, unable to clench teeth or grind with her retainers. Has tried ibuprofen 800 mg every 6 hours without relief. Feels better in the morning, worse as day progresses.  Getting vitamin B12 injections, feels a little better.  Missed Depo shot. Back together with her husband. Things are going well, they are going to counseling.   Past Medical History:  Diagnosis Date  . Allergy   . Dysmenorrhea   . Dysthymic disorder   . Exposure to STD   . MRSA infection   . Tobacco use disorder    Past Surgical History:  Procedure Laterality Date  . Pigmented nevus removal  04/1989   Family History  Problem Relation Age of Onset  . Cancer Mother 25       one breast  . Cancer Cousin 24       breast  . Anxiety disorder Other   . Cancer Sister 37       lymph node cancer twice  . Anxiety disorder Sister   . Depression Sister    Social History   Tobacco Use  . Smoking status: Former Smoker    Last attempt to quit: 04/13/2012    Years since quitting: 5.7  . Smokeless tobacco: Never Used  Substance Use Topics  . Alcohol use: No    Alcohol/week: 0.6 oz    Types: 1 Cans of beer per week  . 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 She appears well-developed and well-nourished. No distress.  HENT:  Head: Normocephalic and atraumatic.  Right Ear: Tympanic membrane, external ear and ear canal normal.  Left Ear: Tympanic membrane, external ear and ear canal normal.  Nose: Nose normal.  Mouth/Throat: Uvula is midline,  oropharynx is clear and moist and mucous membranes are normal. No trismus in the jaw.  Mildly tender TMJ bilaterally to palpation. No clicking/crepitus.   Eyes: Conjunctivae are normal.  Neck: Normal range of motion. Neck supple.  Cardiovascular: Normal rate and regular rhythm.  Pulmonary/Chest: Effort normal and breath sounds normal.  Lymphadenopathy:    She has no cervical adenopathy.  Neurological: She is alert and oriented to person, place, and time.  Skin: Skin is warm and dry. She is not diaphoretic.  Psychiatric: She has a normal mood and affect. Her behavior is normal. Judgment and thought content normal.  Vitals reviewed.     BP 110/70 (BP Location: Right Arm, Patient Position: Sitting, Cuff Size: Normal)   Pulse 95   Temp 98.2 F (36.8 C) (Oral)   Ht  (1.702 m)   Wt 150 lb (68 kg)   SpO2 97%   BMI 23.49 kg/m  Wt Readings from Last 3 Encounters:  01/25/18 150 lb (68 kg)  01/12/18 149 lb (67.6 kg)  01/09/18 148 lb (67.1 kg)   Results for orders placed or performed in visit on 01/25/18  POCT urine pregnancy  Result Value Ref Range   Preg  Test, Ur Negative Negative       Assessment & Plan:  1. Jaw pain - will try different NSAID, heat, unclear etiology - meloxicam (MOBIC) 15 MG tablet; Take 1 tablet (15 mg total) by mouth daily as needed for pain.  Dispense: 30 tablet; Refill: 0  2. Acute otalgia, left - suspect sinus pressure as exam reassuring - does not want to use steroid nasal spray, oral antihistamines make her feel "weird." - discussed trying Sudafed  3. Missed period - POCT urine pregnancy- negative  - has follow up with PCP next week  Olean Ree, FNP-BC  [facility], [facility]  01/25/2018 2:59 PM

## 2018-01-31 ENCOUNTER — Other Ambulatory Visit (INDEPENDENT_AMBULATORY_CARE_PROVIDER_SITE_OTHER): Payer: 59

## 2018-01-31 ENCOUNTER — Other Ambulatory Visit: Payer: Self-pay

## 2018-01-31 VITALS — BP 119/79 | HR 71

## 2018-01-31 DIAGNOSIS — Z3202 Encounter for pregnancy test, result negative: Secondary | ICD-10-CM | POA: Diagnosis not present

## 2018-01-31 DIAGNOSIS — Z309 Encounter for contraceptive management, unspecified: Secondary | ICD-10-CM

## 2018-01-31 MED ORDER — MEDROXYPROGESTERONE ACETATE 150 MG/ML IM SUSP: 150.0000 mg | INTRAMUSCULAR | 2 refills | Status: DC

## 2018-01-31 NOTE — Telephone Encounter (Signed)
Patient is requesting depo-provera be called into the pharmacy.

## 2018-01-31 NOTE — Progress Notes (Signed)
I have reviewed the chart and agree with nursing staff's documentation of this patient's encounter.  Jaynie Collins, MD 01/31/2018 2:22 PM

## 2018-01-31 NOTE — Progress Notes (Signed)
Patient presented to the office today for a pregnancy test. Patient missed her depo- provera and since she had unprotected sex in the last two weeks, she will do a UPT today and if its negative come back in two weeks to start back on depo. UPT was negative and patient will follow up in twp weeks. Patient voice understanding and will follow up.

## 2018-02-01 ENCOUNTER — Encounter: Payer: Self-pay | Admitting: Internal Medicine

## 2018-02-01 ENCOUNTER — Ambulatory Visit (INDEPENDENT_AMBULATORY_CARE_PROVIDER_SITE_OTHER): Payer: 59 | Admitting: Internal Medicine

## 2018-02-01 VITALS — BP 112/76 | HR 85 | Temp 98.3°F | Ht 65.0 in | Wt 150.0 lb

## 2018-02-01 DIAGNOSIS — E538 Deficiency of other specified B group vitamins: Secondary | ICD-10-CM

## 2018-02-01 DIAGNOSIS — Z0000 Encounter for general adult medical examination without abnormal findings: Secondary | ICD-10-CM

## 2018-02-01 DIAGNOSIS — G43019 Migraine without aura, intractable, without status migrainosus: Secondary | ICD-10-CM

## 2018-02-01 DIAGNOSIS — F410 Panic disorder [episodic paroxysmal anxiety] without agoraphobia: Secondary | ICD-10-CM | POA: Diagnosis not present

## 2018-02-01 LAB — COMPREHENSIVE METABOLIC PANEL
ALT: 22 U/L (ref 0–35)
AST: 17 U/L (ref 0–37)
Albumin: 4.3 g/dL (ref 3.5–5.2)
Alkaline Phosphatase: 60 U/L (ref 39–117)
BILIRUBIN TOTAL: 0.3 mg/dL (ref 0.2–1.2)
BUN: 9 mg/dL (ref 6–23)
CALCIUM: 9.4 mg/dL (ref 8.4–10.5)
CHLORIDE: 106 meq/L (ref 96–112)
CO2: 28 meq/L (ref 19–32)
Creatinine, Ser: 0.72 mg/dL (ref 0.40–1.20)
GFR: 101.75 mL/min (ref >60.00)
GLUCOSE: 73 mg/dL (ref 70–99)
POTASSIUM: 3.7 meq/L (ref 3.5–5.1)
Sodium: 140 mEq/L (ref 135–145)
Total Protein: 6.7 g/dL (ref 6.0–8.3)

## 2018-02-01 LAB — LIPID PANEL
CHOL/HDL RATIO: 3
Cholesterol: 162 mg/dL (ref 0–200)
HDL: 48.9 mg/dL (ref >39.00)
LDL CALC: 88 mg/dL (ref 0–99)
NONHDL: 113.14
TRIGLYCERIDES: 124 mg/dL (ref 0.0–149.0)
VLDL: 24.8 mg/dL (ref 0.0–40.0)

## 2018-02-01 LAB — CBC
HCT: 40.1 % (ref 36.0–46.0)
Hemoglobin: 13.9 g/dL (ref 12.0–15.0)
MCHC: 34.6 g/dL (ref 30.0–36.0)
MCV: 91 fl (ref 78.0–100.0)
Platelets: 259 10*3/uL (ref 150.0–400.0)
RBC: 4.41 Mil/uL (ref 3.87–5.11)
RDW: 12.6 % (ref 11.5–15.5)
WBC: 5.9 10*3/uL (ref 4.0–10.5)

## 2018-02-01 LAB — VITAMIN B12: Vitamin B-12: 374 pg/mL (ref 211–911)

## 2018-02-01 NOTE — Patient Instructions (Signed)

## 2018-02-01 NOTE — Progress Notes (Signed)
Subjective:    Patient ID: Taylor Bautista, female    DOB: [birth year], 27 y.o.   MRN: [id number]  HPI  Pt presents to the clinic today for her annual exam. She is also due to follow up chronic conditions.  Migraines: These occur a few times a month. They have not changed. She takes Excedrin Migraine as needed with good relief.  Anxiety: Intermittent. She is not currently taking anything of anxiety at this time. She denies depression, SI/HI.  B12 Deficiency: She reports her B12 level was 121. She has received 1 B12 injection, and has been taking oral B12 daily. She still feels fatigued. She is due to have her B12 repeated today.  Flu: 06/2016 Tetanus: 05/2016 Pap Smear: 06/2016 Dentist: biannually  Diet: She does eat meat. She consumes fruits and veggies daily. She does eat some fried food. She drinks mostly water. Exercise: None  Review of Systems      Past Medical History:  Diagnosis Date  . Allergy   . Dysmenorrhea   . Dysthymic disorder   . Exposure to STD   . MRSA infection   . Tobacco use disorder     Current Outpatient Medications  Medication Sig Dispense Refill  . medroxyPROGESTERone (DEPO-PROVERA) 150 MG/ML injection Inject 1 mL (150 mg total) into the muscle every 3 (three) months. 1 mL 2  . meloxicam (MOBIC) 15 MG tablet Take 1 tablet (15 mg total) by mouth daily as needed for pain. 30 tablet 0  . vitamin B-12 (CYANOCOBALAMIN) 500 MCG tablet Take 500 mcg by mouth daily.     No current facility-administered medications for this visit.     No Known Allergies  Family History  Problem Relation Age of Onset  . Cancer Mother 18       one breast  . Cancer Cousin 24       breast  . Anxiety disorder Other   . Cancer Sister 84       lymph node cancer twice  . Anxiety disorder Sister   . Depression Sister     Social History   Socioeconomic History  . Marital status: Divorced    Spouse name: Not on file  . Number of children: Not on file  . Years of  education: Not on file  . Highest education level: Not on file  Occupational History  . Not on file  Social Needs  . Financial resource strain: Not on file  . Food insecurity:    Worry: Not on file    Inability: Not on file  . Transportation needs:    Medical: Not on file    Non-medical: Not on file  Tobacco Use  . Smoking status: Former Smoker    Last attempt to quit: 04/13/2012    Years since quitting: 5.8  . Smokeless tobacco: Never Used  Substance and Sexual Activity  . Alcohol use: No    Alcohol/week: 0.6 oz    Types: 1 Cans of beer per week  . 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 Sexual activity: Not Currently    Partners: Male    Birth control/protection: IUD  Lifestyle  . Physical activity:    Days per week: Not on file    Minutes per session: Not on file  . Stress: Not on file  Relationships  . Social connections:    Talks on phone: Not on file    Gets together: Not on  file    Attends religious service: Not on file    Active member of club or organization: Not on file    Attends meetings of clubs or organizations: Not on file    Relationship status: Not on file  . Intimate partner violence:    Fear of current or ex partner: Not on file    Emotionally abused: Not on file    Physically abused: Not on file    Forced sexual activity: Not on file  Other Topics Concern  . Not on file  Social History Narrative   Lives with mother      Works at [employer]           Constitutional: Pt reports fatigue. Denies fever, malaise, headache or abrupt weight changes.  HEENT: Denies eye pain, eye redness, ear pain, ringing in the ears, wax buildup, runny nose, nasal congestion, bloody nose, or sore throat. Respiratory: Denies difficulty breathing, shortness of breath, cough or sputum production.   Cardiovascular: Denies chest pain, chest tightness, palpitations or swelling in the hands or feet.    Gastrointestinal: Denies abdominal pain, bloating, constipation, diarrhea or blood in the stool.  GU: Denies urgency, frequency, pain with urination, burning sensation, blood in urine, odor or discharge. Musculoskeletal: Denies decrease in range of motion, difficulty with gait, muscle pain or joint pain and swelling.  Skin: Denies redness, rashes, lesions or ulcercations.  Neurological: Denies dizziness, difficulty with memory, difficulty with speech or problems with balance and coordination.  Psych: Denies anxiety, depression, SI/HI.  No other specific complaints in a complete review of systems (except as listed in HPI above).  Objective:   Physical Exam   BP 112/76   Pulse 85   Temp 98.3 F (36.8 C) (Oral)   Ht  (1.651 m)   Wt 150 lb (68 kg)   SpO2 98%   BMI 24.96 kg/m  Wt Readings from Last 3 Encounters:  02/01/18 150 lb (68 kg)  01/25/18 150 lb (68 kg)  01/12/18 149 lb (67.6 kg)    General: Appears her stated age, well developed, well nourished in NAD. Skin: Warm, dry and intact.  HEENT: Head: normal shape and size; Eyes: sclera white, no icterus, conjunctiva pink, PERRLA and EOMs intact; Ears: Tm's gray and intact, normal light reflex; Throat/Mouth: Teeth present, mucosa pink and moist, no exudate, lesions or ulcerations noted.  Neck:  Neck supple, trachea midline. No masses, lumps or thyromegaly present.  Cardiovascular: Normal rate and rhythm. S1,S2 noted.  No murmur, rubs or gallops noted. No JVD or BLE edema.  Pulmonary/Chest: Normal effort and positive vesicular breath sounds. No respiratory distress. No wheezes, rales or ronchi noted.  Abdomen: Soft and nontender. Normal bowel sounds. No distention or masses noted. Liver, spleen and kidneys non palpable. Musculoskeletal: Strength 5/5 BUE/BLE. No difficulty with gait.  Neurological: Alert and oriented. Cranial nerves II-XII grossly intact. Coordination normal.  Psychiatric: Mood and affect normal. Behavior is  normal. Judgment and thought content normal.     BMET    Component Value Date/Time   NA 139 09/23/2017 1259   K 4.3 09/23/2017 1259   CL 105 09/23/2017 1259   CO2 29 09/23/2017 1259   GLUCOSE 82 09/23/2017 1259   BUN 12 09/23/2017 1259   CREATININE 0.67 09/23/2017 1259   CALCIUM 9.3 09/23/2017 1259   GFRNONAA >60 04/28/2015 2305   GFRAA >60 04/28/2015 2305    Lipid Panel  No results found for: CHOL, TRIG, HDL, CHOLHDL, VLDL, LDLCALC  CBC    Component Value Date/Time   WBC 5.8 09/23/2017 1259   RBC 4.40 09/23/2017 1259   HGB 13.5 09/23/2017 1259   HCT 40.6 09/23/2017 1259   PLT 266.0 09/23/2017 1259   MCV 92.2 09/23/2017 1259   MCH 31.0 04/28/2015 2305   MCHC 33.3 09/23/2017 1259   RDW 12.7 09/23/2017 1259   LYMPHSABS 1.6 09/23/2017 1259   MONOABS 0.5 09/23/2017 1259   EOSABS 0.3 09/23/2017 1259   BASOSABS 0.0 09/23/2017 1259    Hgb A1C No results found for: HGBA1C         Assessment & Plan:   Preventative Health Maintenance:  Encouraged her to get a flu shot in the fall Tetanus UTD Pap smear UTD Encouraged her to consume a balanced diet and exercise regimen Advised her to see a dentist annually Will check CBC, CMET, Lipid profile and B12 today  RTC in 1 year, sooner if needed

## 2018-02-01 NOTE — Assessment & Plan Note (Signed)
She is not interested in preventative therapy Continue Excedrin Migraine prn

## 2018-02-01 NOTE — Assessment & Plan Note (Signed)
Currently not on any medication Support offered today Will monitor

## 2018-02-01 NOTE — Assessment & Plan Note (Signed)
Will check B12 today Continue monthly B12 injections

## 2018-02-02 ENCOUNTER — Ambulatory Visit (INDEPENDENT_AMBULATORY_CARE_PROVIDER_SITE_OTHER): Payer: 59 | Admitting: Internal Medicine

## 2018-02-02 ENCOUNTER — Encounter: Payer: Self-pay | Admitting: Internal Medicine

## 2018-02-02 ENCOUNTER — Ambulatory Visit: Payer: Self-pay | Admitting: *Deleted

## 2018-02-02 VITALS — BP 112/62 | HR 66 | Ht 65.0 in | Wt 150.0 lb

## 2018-02-02 DIAGNOSIS — G4719 Other hypersomnia: Secondary | ICD-10-CM | POA: Diagnosis not present

## 2018-02-02 NOTE — Telephone Encounter (Signed)
She had her physical and blood work done yesterday at the [facility]   They drew her blood from the left bend of her arm without any complications per pt.   "They did not dig around".   "She put the needle right in and got my blood without any problem".    Pt denied having any shooting pains during the procedure.   She is denying numbness or tingling in her left hand or arm.    She says it is sore and the soreness extends up into her armpit.    She can straighten her arm but it hurts.  There is a bruise about the size of a "thumb print" at the needle site.  I went over the home care advise with her.   Use ice for 48 hrs then switch to heat.   I instructed her call us back if the pain became worse or her symptoms did not start to resolve after a couple of days.  Pt verbalized understanding and was agreeable to this plan.    I routed a note to Nicki Reaper just making her aware of the situation.   Reason for Disposition . Minor injury or bruising from direct blow  Answer Assessment - Initial Assessment Questions 1. MECHANISM: "How did the injury happen?"     Had blood drawn yesterday at the office at [facility] office.   Today my left arm is so sore and the soreness is going up my arm into my armpit.  No pain below the site.   No swelling.   Just bruised.  Bruise size of a thumb print. 2. ONSET: "When did the injury happen?" (Minutes or hours ago)      Teacher, English as a foreign language.    3. LOCATION: "Where is the injury located?"      Left  Bend of arm. 4. APPEARANCE of INJURY: "What does the injury look like?"      Bruised no swelling. 5. SEVERITY: "Can you use the arm normally?"      No.  I can't straighten my arm.    It hurts really bad if I do straighten it all the way. 6. SWELLING or BRUISING: "is there any swelling or bruising?" If so, ask: "How large is it? (e.g., inches, centimeters)      See above. 7. PAIN: "Is there pain?" If so, ask: "How bad is the pain?"    (Scale 1-10; or mild, moderate,  severe)     Just when I move it it hurts.    When drawing the blood it hurt.   The pain was right where the needle was.   No trouble with drawing my blood.    8. TETANUS: For any breaks in the skin, ask: "When was the last tetanus booster?"     *No Answer* 9. OTHER SYMPTOMS: "Do you have any other symptoms?"  (e.g., numbness in hand)     No. 10. PREGNANCY: "Is there any chance you are pregnant?" "When was your last menstrual period?"       Not asked.  Protocols used: ARM INJURY-A-AH

## 2018-02-02 NOTE — Progress Notes (Signed)
[facility] Consultation      Assessment and Plan:  Excessive daytime sleepiness. -Symptoms and signs of obstructive sleep apnea. - We will send for sleep study.  If positive will start CPAP as indicated, patient not interested in a dental device as she is currently wearing retainers.  Date: 02/02/2018  MRN# [id number] Taylor Bautista Mar 26, 1991   Taylor Bautista is a 27 y.o. old female seen in consultation for chief complaint of:    Chief Complaint  Patient presents with  . Consult    ref by L.W. Murray: sleep issues: daytime sleepiness: snores: restless sleep:     HPI:   She has been having excess fatigue, she can fall asleep at any time. Husband tells her she snores really bad and she stops breathing in her sleep, sometimes he shakes her. She also moves her legs a lot.  She goes to bed at 9pm,  Falls asleep in about an hour. She wakes a few times per night, then wakes at 430am. She wakes feeling tired, she had headaches. She has occasional trouble concentrating.  No cataplexy. She works a regular shift from 6am to 1 pm every weekday.  ESS 17.  She wears retainers at night, never been diagnosed with TMJ.     PMHX:   Past Medical History:  Diagnosis Date  . Allergy   . Dysmenorrhea   . Dysthymic disorder   . Exposure to STD   . MRSA infection   . Tobacco use disorder    Surgical Hx:  Past Surgical History:  Procedure Laterality Date  . Pigmented nevus removal  04/1989   Family Hx:  Family History  Problem Relation Age of Onset  . Cancer Mother 32       one breast  . Cancer Cousin 24       breast  . Anxiety disorder Other   . Cancer Sister 48       lymph node cancer twice  . Anxiety disorder Sister   . Depression Sister    Social Hx:   Social History   Tobacco Use  . Smoking status: Former Smoker    Last attempt to quit: 04/13/2012    Years since quitting: 5.8  . Smokeless tobacco: Never Used  Substance Use Topics  . Alcohol use:  No    Alcohol/week: 0.6 oz    Types: 1 Cans of beer per week  . 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  medroxyPROGESTERone (DEPO-PROVERA) 150 MG/ML injection, Inject 1 mL (150 mg total) into the muscle every 3 (three) months., Disp: 1 mL, Rfl: 2 .  vitamin B-12 (CYANOCOBALAMIN) 500 MCG tablet, Take 500 mcg by mouth daily., Disp: , Rfl:    Allergies:  Patient has no known allergies.  Review of Systems: Gen:  Denies  fever, sweats, chills HEENT: Denies blurred vision, double vision. bleeds, sore throat Cvc:  No dizziness, chest pain. Resp:   Denies cough or sputum production, shortness of breath Gi: Denies swallowing difficulty, stomach pain. Gu:  Denies bladder incontinence, burning urine Ext:   No Joint pain, stiffness. Skin: No skin rash,  hives  Endoc:  No polyuria, polydipsia. Psych: No depression, insomnia. Other:  All other systems were reviewed with the patient and were negative other that what is mentioned in the HPI.   Physical Examination:   VS: BP 112/62 (BP Location:  Left Arm, Cuff Size: Normal)   Pulse 66   Ht  (1.651 m)   Wt 150 lb (68 kg)   SpO2 95%   BMI 24.96 kg/m   General Appearance: No distress  Neuro:without focal findings,  speech normal,  HEENT: PERRLA, EOM intact.  Mallampati 3 Pulmonary: normal breath sounds, No wheezing.  CardiovascularNormal S1,S2.  No m/r/g.   Abdomen: Benign, Soft, non-tender. Renal:  No costovertebral tenderness  GU:  No performed at this time. Endoc: No evident thyromegaly, no signs of acromegaly. Skin:   warm, no rashes, no ecchymosis  Extremities: normal, no cyanosis, clubbing.  Other findings:    LABORATORY PANEL:   CBC Recent Labs  Lab 02/01/18 1453  WBC 5.9  HGB 13.9  HCT 40.1  PLT 259.0    ------------------------------------------------------------------------------------------------------------------  Chemistries  Recent Labs  Lab 02/01/18 1453  NA 140  K 3.7  CL 106  CO2 28  GLUCOSE 73  BUN 9  CREATININE 0.72  CALCIUM 9.4  AST 17  ALT 22  ALKPHOS 60  BILITOT 0.3   ------------------------------------------------------------------------------------------------------------------  Cardiac Enzymes No results for input(s): TROPONINI in the last 168 hours. ------------------------------------------------------------  RADIOLOGY:  No results found.     Thank  you for the consultation and for allowing [facility] to assist in the care of your patient. Our recommendations are noted above.  Please contact us if we can be of further service.   Wells Guiles, MD.  Board Certified in Internal Medicine, Pulmonary Medicine, Critical Care Medicine, and Sleep Medicine.  [facility] Office Number: [phone]  Santiago Glad, M.D.  Billy Fischer, M.D  02/02/2018

## 2018-02-02 NOTE — Patient Instructions (Signed)
  Will send your for sleep study.     Sleep Apnea      Sleep apnea is disorder that affects a person's sleep. A person with sleep apnea has abnormal pauses in their breathing when they sleep. It is hard for them to get a good sleep. This makes a person tired during the day. It also can lead to other physical problems. There are three types of sleep apnea. One type is when breathing stops for a short time because your airway is blocked (obstructive sleep apnea). Another type is when the brain sometimes fails to give the normal signal to breathe to the muscles that control your breathing (central sleep apnea). The third type is a combination of the other two types. HOME CARE   Take all medicine as told by your doctor.  Avoid alcohol, calming medicines (sedatives), and depressant drugs.  Try to lose weight if you are overweight. Talk to your doctor about a healthy weight goal.  Your doctor may have you use a device that helps to open your airway. It can help you get the air that you need. It is called a positive airway pressure (PAP) device.   MAKE SURE YOU:   Understand these instructions.  Will watch your condition.  Will get help right away if you are not doing well or get worse.  It may take approximately 1 month for you to get used to wearing her CPAP every night.  Be sure to work with your machine to get used to it, be patient, it may take time!

## 2018-02-14 ENCOUNTER — Other Ambulatory Visit: Payer: 59

## 2018-02-21 ENCOUNTER — Telehealth: Payer: Self-pay | Admitting: Internal Medicine

## 2018-02-21 ENCOUNTER — Encounter: Payer: Self-pay | Admitting: Internal Medicine

## 2018-02-21 ENCOUNTER — Other Ambulatory Visit (INDEPENDENT_AMBULATORY_CARE_PROVIDER_SITE_OTHER): Payer: 59

## 2018-02-21 ENCOUNTER — Ambulatory Visit (INDEPENDENT_AMBULATORY_CARE_PROVIDER_SITE_OTHER): Payer: 59

## 2018-02-21 VITALS — BP 107/71 | HR 72

## 2018-02-21 DIAGNOSIS — G4733 Obstructive sleep apnea (adult) (pediatric): Secondary | ICD-10-CM | POA: Diagnosis not present

## 2018-02-21 DIAGNOSIS — E538 Deficiency of other specified B group vitamins: Secondary | ICD-10-CM

## 2018-02-21 DIAGNOSIS — R300 Dysuria: Secondary | ICD-10-CM

## 2018-02-21 DIAGNOSIS — G4719 Other hypersomnia: Secondary | ICD-10-CM

## 2018-02-21 LAB — POCT URINE QUALITATIVE DIPSTICK BLOOD: RBC UA: POSITIVE

## 2018-02-21 MED ORDER — MEDROXYPROGESTERONE ACETATE 150 MG/ML IM SUSP: 150.0000 mg | INTRAMUSCULAR | 0 refills | Status: DC

## 2018-02-21 MED ORDER — CYANOCOBALAMIN 1000 MCG/ML IJ SOLN
1000.0000 ug | Freq: Once | INTRAMUSCULAR | Status: AC
  Administered 2018-02-21: 1000 ug via INTRAMUSCULAR

## 2018-02-21 MED ORDER — CIPROFLOXACIN HCL 500 MG PO TABS: 500.0000 mg | ORAL_TABLET | Freq: Two times a day (BID) | ORAL | 0 refills | Status: DC

## 2018-02-21 NOTE — Telephone Encounter (Signed)
Copied from CRM [phone]. Topic: Appointment Scheduling - Scheduling Inquiry for Clinic >> Feb 21, 2018 10:37 AM Taylor CocoLathan, Taylor Bautista, NT wrote: CRM for notification. See Telephone encounter for: 02/21/18.   Reason for CRM: Pt. Requesting to have b-12 injection done today if possible 02/21/18

## 2018-02-21 NOTE — Addendum Note (Signed)
Addended by: Cheree DittoGRAHAM, Zaiyah Sottile A on: 02/21/2018 02:09 PM   Modules accepted: Orders

## 2018-02-21 NOTE — Telephone Encounter (Signed)
I spoke with pt and scheduled pt 02/21/18 at 3:30 for nurse visit for Vit B 12.

## 2018-02-21 NOTE — Progress Notes (Signed)
Per orders of R Baity NP, injection of Vit B 12 given by Mattheu Brodersen. Patient tolerated injection well. 

## 2018-02-21 NOTE — Addendum Note (Signed)
Addended by: Cheree DittoGRAHAM, DEMETRICE A on: 02/21/2018 01:31 PM   Modules accepted: Orders

## 2018-02-21 NOTE — Progress Notes (Signed)
SUBJECTIVE: Taylor Bautista is a 27 y.o. female who complains of urinary frequency, urgency and dysuria x 2 days, with flank pain and abnormal pain.   OBJECTIVE: Appears well, in no apparent distress.  Vital signs are normal. Urine dipstick shows positive leuk and positive for blood. Will send for culture.  ASSESSMENT: Dysuria  PLAN: Treatment per orders.  Call or return to clinic prn if these symptoms worsen or fail to improve as anticipated.

## 2018-02-21 NOTE — Telephone Encounter (Signed)
See note. This is a [facility] patient.

## 2018-02-21 NOTE — Progress Notes (Signed)
I have reviewed the chart and agree with nursing staff's documentation of this patient's encounter.  Jaynie CollinsUgonna Adlee Paar, MD 02/21/2018 1:33 PM

## 2018-02-22 LAB — URINE CULTURE: Organism ID, Bacteria: NO GROWTH

## 2018-02-23 ENCOUNTER — Ambulatory Visit (INDEPENDENT_AMBULATORY_CARE_PROVIDER_SITE_OTHER): Payer: 59

## 2018-02-23 VITALS — BP 124/81 | HR 109

## 2018-02-23 DIAGNOSIS — Z3042 Encounter for surveillance of injectable contraceptive: Secondary | ICD-10-CM | POA: Diagnosis not present

## 2018-02-23 MED ORDER — MEDROXYPROGESTERONE ACETATE 150 MG/ML IM SUSP
150.0000 mg | Freq: Once | INTRAMUSCULAR | Status: AC
  Administered 2018-02-23: 150 mg via INTRAMUSCULAR

## 2018-02-23 NOTE — Progress Notes (Signed)
Patient presented to the office today for her depo-provera injection received in left gluteal area 150 mg. Patient UPT was negative in the office today and she has not had sex in the last two weeks. Patient will follow up for next injection between 08/29-09-12.

## 2018-02-24 ENCOUNTER — Telehealth: Payer: Self-pay | Admitting: *Deleted

## 2018-02-24 DIAGNOSIS — G4733 Obstructive sleep apnea (adult) (pediatric): Secondary | ICD-10-CM

## 2018-02-24 NOTE — Telephone Encounter (Signed)
Pt aware Orders placed.   Recommendation auto-cpap with pressure range of 5-15 cm H20.

## 2018-02-27 ENCOUNTER — Telehealth: Payer: Self-pay | Admitting: *Deleted

## 2018-02-27 MED ORDER — FLUCONAZOLE 150 MG PO TABS: 150.0000 mg | ORAL_TABLET | Freq: Once | ORAL | 0 refills | Status: AC

## 2018-02-27 NOTE — Telephone Encounter (Signed)
-----   Message from Lindell SparHeather L Bacon, [place] sent at 02/24/2018 10:11 AM EDT ----- Regarding: diflucan rx Pleas send diflucan to [facility] in [place]

## 2018-03-21 ENCOUNTER — Ambulatory Visit (INDEPENDENT_AMBULATORY_CARE_PROVIDER_SITE_OTHER): Payer: 59 | Admitting: Emergency Medicine

## 2018-03-21 DIAGNOSIS — E538 Deficiency of other specified B group vitamins: Secondary | ICD-10-CM | POA: Diagnosis not present

## 2018-03-21 MED ORDER — CYANOCOBALAMIN 1000 MCG/ML IJ SOLN
1000.0000 ug | Freq: Once | INTRAMUSCULAR | Status: AC
  Administered 2018-03-21: 1000 ug via INTRAMUSCULAR

## 2018-03-21 NOTE — Progress Notes (Signed)
[facility] F[facility], injection of B12 given by Zollie PeeSierra Rondon. Patient tolerated injection well. Right Deltoid

## 2018-05-03 ENCOUNTER — Ambulatory Visit (INDEPENDENT_AMBULATORY_CARE_PROVIDER_SITE_OTHER): Payer: 59

## 2018-05-03 ENCOUNTER — Ambulatory Visit: Payer: 59

## 2018-05-03 DIAGNOSIS — E538 Deficiency of other specified B group vitamins: Secondary | ICD-10-CM

## 2018-05-03 MED ORDER — CYANOCOBALAMIN 1000 MCG/ML IJ SOLN
1000.0000 ug | Freq: Once | INTRAMUSCULAR | Status: AC
  Administered 2018-05-03: 1000 ug via INTRAMUSCULAR

## 2018-05-03 NOTE — Progress Notes (Signed)
Per orders of R Baity NP, injection of Vit B 12 given by Shannie Kontos. Patient tolerated injection well. 

## 2018-05-16 ENCOUNTER — Other Ambulatory Visit: Payer: Self-pay

## 2018-05-16 ENCOUNTER — Encounter: Payer: Self-pay | Admitting: Family Medicine

## 2018-05-16 ENCOUNTER — Ambulatory Visit (INDEPENDENT_AMBULATORY_CARE_PROVIDER_SITE_OTHER): Payer: 59 | Admitting: Family Medicine

## 2018-05-16 ENCOUNTER — Ambulatory Visit (INDEPENDENT_AMBULATORY_CARE_PROVIDER_SITE_OTHER): Payer: 59

## 2018-05-16 VITALS — BP 111/75 | HR 80 | Resp 16 | Ht 66.0 in | Wt 158.4 lb

## 2018-05-16 VITALS — BP 108/62 | HR 84 | Temp 98.4°F | Ht 65.0 in | Wt 158.0 lb

## 2018-05-16 DIAGNOSIS — R21 Rash and other nonspecific skin eruption: Secondary | ICD-10-CM | POA: Diagnosis not present

## 2018-05-16 DIAGNOSIS — Z309 Encounter for contraceptive management, unspecified: Secondary | ICD-10-CM

## 2018-05-16 DIAGNOSIS — Z3042 Encounter for surveillance of injectable contraceptive: Secondary | ICD-10-CM

## 2018-05-16 MED ORDER — VALACYCLOVIR HCL 1 G PO TABS: 1000.0000 mg | ORAL_TABLET | Freq: Three times a day (TID) | ORAL | 0 refills | Status: DC

## 2018-05-16 MED ORDER — MEDROXYPROGESTERONE ACETATE 150 MG/ML IM SUSP
150.0000 mg | Freq: Once | INTRAMUSCULAR | Status: AC
  Administered 2018-05-16: 150 mg via INTRAMUSCULAR

## 2018-05-16 MED ORDER — MEDROXYPROGESTERONE ACETATE 150 MG/ML IM SUSP: 150.0000 mg | INTRAMUSCULAR | 5 refills | Status: DC

## 2018-05-16 MED ORDER — TRIAMCINOLONE ACETONIDE 0.1 % EX CREA: 1.0000 "application " | TOPICAL_CREAM | Freq: Two times a day (BID) | CUTANEOUS | 0 refills | Status: DC

## 2018-05-16 NOTE — Progress Notes (Signed)
After obtaining consent, and per orders of Dr. Macon Large, injection of Depo Provera given by Riley Nearing. Last administered 02/23/18 - LUOQ. Window: 08/29-0912. Patient is within window for injection. Patient reports no complaints from last injection. Patient tolerated injection - RUOQ - well with no complaints. Patient advised to schedule next Depo Provera injection in 3 months, between 11/19-12/3. Patient stated she understood.    Patient instructed to remain in clinic for 20 minutes afterwards, and to report any adverse reaction to me immediately.

## 2018-05-16 NOTE — Progress Notes (Signed)
Subjective:    Patient ID: Taylor Bautista, female    DOB: 05/19/1991, 27 y.o.   MRN: [id number]  HPI Here for suspected insect bite on R arm   Was bitten at work- had a jacket on and felt a bite (did not see the insect) Then it got itchy  Has not put anything on it   Cleaning with soap and water   Is more sore than itchy at this point  No open area or drainage  Of note-she had shingles in her 20s on back  This pain feels similar  No mouth/throat swelling or trouble breathing  No significant rash   No tick bites known    Patient Active Problem List   Diagnosis Date Noted  . Rash and nonspecific skin eruption 05/16/2018  . B12 deficiency 01/12/2018  . Episodic paroxysmal anxiety disorder 12/26/2014  . Migraine without aura 12/05/2012  . Allergic rhinitis 03/20/2007   Past Medical History:  Diagnosis Date  . Allergy   . Dysmenorrhea   . Dysthymic disorder   . Exposure to STD   . MRSA infection   . Tobacco use disorder    Past Surgical History:  Procedure Laterality Date  . Pigmented nevus removal  04/1989   Social History   Tobacco Use  . Smoking status: Current Every Day Smoker    Packs/day: 0.25  . Smokeless tobacco: Never Used  Substance Use Topics  . Alcohol use: No    Alcohol/week: 1.0 standard drinks    Types: 1 Cans of beer per week  . 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 Cancer Mother 49       one breast  . Cancer Cousin 24       breast  . Anxiety disorder Other   . Cancer Sister 29       lymph node cancer twice  . Anxiety disorder Sister   . Depression Sister    No Known Allergies Current Outpatient Medications on File Prior to Visit  Medication Sig Dispense Refill  . medroxyPROGESTERone (DEPO-PROVERA) 150 MG/ML injection Inject 1 mL (150 mg total) into the muscle every 3 (three) months. 1 mL 5  . vitamin B-12 (CYANOCOBALAMIN) 500  MCG tablet Take 500 mcg by mouth daily.     No current facility-administered medications on file prior to visit.     Review of Systems  Constitutional: Negative for activity change, appetite change, fatigue, fever and unexpected weight change.  HENT: Negative for congestion, ear pain, rhinorrhea, sinus pressure and sore throat.   Eyes: Negative for pain, redness and visual disturbance.  Respiratory: Negative for cough, shortness of breath and wheezing.   Cardiovascular: Negative for chest pain and palpitations.  Gastrointestinal: Negative for abdominal pain, blood in stool, constipation and diarrhea.  Endocrine: Negative for polydipsia and polyuria.  Genitourinary: Negative for dysuria, frequency and urgency.  Musculoskeletal: Negative for arthralgias, back pain and myalgias.  Skin: Positive for rash. Negative for pallor.       R arm bite vs rash  Allergic/Immunologic: Negative for environmental allergies.  Neurological: Negative for dizziness, syncope and headaches.  Hematological: Negative for adenopathy. Does not bruise/bleed easily.  Psychiatric/Behavioral: Negative for decreased concentration and dysphoric mood. The patient is not nervous/anxious.        Objective:   Physical Exam  Constitutional: She appears well-developed and well-nourished. No distress.  HENT:  Head: Normocephalic and atraumatic.  Mouth/Throat: Oropharynx is clear and moist.  Eyes: Pupils are equal, round, and reactive to light. Conjunctivae and EOM are normal. No scleral icterus.  Neck: Normal range of motion. Neck supple.  Cardiovascular: Normal rate, regular rhythm and normal heart sounds.  Pulmonary/Chest: Effort normal and breath sounds normal. No respiratory distress. She has no wheezes.  Lymphadenopathy:    She has no cervical adenopathy.  Neurological: She is alert. Coordination normal.  Skin: Skin is warm and dry. Rash noted.  Area of whelped papules (pink color) 2-3 cm on R lateral upper arm    Smaller area (1 cm) on forearm-similar in appearance   Tanned    Psychiatric: She has a normal mood and affect.          Assessment & Plan:   Problem List Items Addressed This Visit      Musculoskeletal and Integument   Rash and nonspecific skin eruption - Primary    Several areas - larger on R upper arm and smaller on lower arm- small confluent papules  Cannot r/o zoster with this presentation (she has had shingles before and states it feels similar)  Could also be contact derm or insect bite Px valcyclovir Also triamcinolone cream Keep loosely covered/cool/ clean and dry  Update if not starting to improve in a week or if worsening  (esp if worse pain)   Meds ordered this encounter  Medications  . valACYclovir (VALTREX) 1000 MG tablet    Sig: Take 1 tablet (1,000 mg total) by mouth 3 (three) times daily.    Dispense:  21 tablet    Refill:  0  . triamcinolone cream (KENALOG) 0.1 %    Sig: Apply 1 application topically 2 (two) times daily. To affected area    Dispense:  30 g    Refill:  0

## 2018-05-16 NOTE — Progress Notes (Signed)
I have reviewed the chart and agree with nursing staff's documentation of this patient's encounter.  Dmya Long, MD 05/16/2018 2:27 PM    

## 2018-05-16 NOTE — Patient Instructions (Signed)
I cannot completely rule out early shingles rash- so take the valacyclovir as directed   Use triamcinolone cream twice daily to affected areas Keep it clean and dry (soap and water)  Cover lightly   Update if not starting to improve in a week or if worsening

## 2018-05-16 NOTE — Assessment & Plan Note (Signed)
Several areas - larger on R upper arm and smaller on lower arm- small confluent papules  Cannot r/o zoster with this presentation (she has had shingles before and states it feels similar)  Could also be contact derm or insect bite Px valcyclovir Also triamcinolone cream Keep loosely covered/cool/ clean and dry  Update if not starting to improve in a week or if worsening  (esp if worse pain)   Meds ordered this encounter  Medications  . valACYclovir (VALTREX) 1000 MG tablet    Sig: Take 1 tablet (1,000 mg total) by mouth 3 (three) times daily.    Dispense:  21 tablet    Refill:  0  . triamcinolone cream (KENALOG) 0.1 %    Sig: Apply 1 application topically 2 (two) times daily. To affected area    Dispense:  30 g    Refill:  0

## 2018-05-16 NOTE — Telephone Encounter (Signed)
Refill on depo-provera  

## 2018-06-15 ENCOUNTER — Ambulatory Visit (INDEPENDENT_AMBULATORY_CARE_PROVIDER_SITE_OTHER): Payer: 59 | Admitting: Internal Medicine

## 2018-06-15 ENCOUNTER — Encounter: Payer: Self-pay | Admitting: Internal Medicine

## 2018-06-15 VITALS — BP 118/78 | HR 92 | Temp 98.6°F | Wt 163.0 lb

## 2018-06-15 DIAGNOSIS — E538 Deficiency of other specified B group vitamins: Secondary | ICD-10-CM

## 2018-06-15 DIAGNOSIS — J00 Acute nasopharyngitis [common cold]: Secondary | ICD-10-CM

## 2018-06-15 MED ORDER — METHYLPREDNISOLONE ACETATE 80 MG/ML IJ SUSP
80.0000 mg | Freq: Once | INTRAMUSCULAR | Status: AC
  Administered 2018-06-15: 80 mg via INTRAMUSCULAR

## 2018-06-15 MED ORDER — AZITHROMYCIN 250 MG PO TABS: ORAL_TABLET | ORAL | 0 refills | Status: DC

## 2018-06-15 MED ORDER — CYANOCOBALAMIN 1000 MCG/ML IJ SOLN
1000.0000 ug | Freq: Once | INTRAMUSCULAR | Status: AC
  Administered 2018-06-15: 1000 ug via INTRAMUSCULAR

## 2018-06-15 NOTE — Progress Notes (Signed)
HPI  Pt presents to the clinic today with c/o sore throat, cough and chest congestion. She reports this started 10 days ago. She denies difficulty swallowing. The cough is productive of yellow/green mucous. She denies runny nose, ear pain, fever, chills or body aches. She has tried Dayquil, Nyquil, Robitussin and Ibuprofen with minimal relief. She has not had sick contacts that she is aware of.  Review of Systems      Past Medical History:  Diagnosis Date  . Allergy   . Dysmenorrhea   . Dysthymic disorder   . Exposure to STD   . MRSA infection   . Tobacco use disorder     Family History  Problem Relation Age of Onset  . Cancer Mother 58       one breast  . Cancer Cousin 24       breast  . Anxiety disorder Other   . Cancer Sister 56       lymph node cancer twice  . Anxiety disorder Sister   . Depression Sister     Social History   Socioeconomic History  . Marital status: Divorced    Spouse name: Not on file  . Number of children: Not on file  . Years of education: Not on file  . Highest education level: Not on file  Occupational History  . Not on file  Social Needs  . Financial resource strain: Not on file  . Food insecurity:    Worry: Not on file    Inability: Not on file  . Transportation needs:    Medical: Not on file    Non-medical: Not on file  Tobacco Use  . Smoking status: Current Every Day Smoker    Packs/day: 0.25  . Smokeless tobacco: Never Used  Substance and Sexual Activity  . Alcohol use: No    Alcohol/week: 1.0 standard drinks    Types: 1 Cans of beer per week  . 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 Sexual activity: Not Currently    Partners: Male    Birth control/protection: IUD  Lifestyle  . Physical activity:    Days per week: Not on file    Minutes per session: Not on file  . Stress: Not on file  Relationships  . Social connections:    Talks on phone: Not on file    Gets  together: Not on file    Attends religious service: Not on file    Active member of club or organization: Not on file    Attends meetings of clubs or organizations: Not on file    Relationship status: Not on file  . Intimate partner violence:    Fear of current or ex partner: Not on file    Emotionally abused: Not on file    Physically abused: Not on file    Forced sexual activity: Not on file  Other Topics Concern  . Not on file  Social History Narrative   Lives with mother      Works at [employer]          No Known Allergies   Constitutional: Denies headache, fatigue, fever or abrupt weight changes.  HEENT:  Positive sore throat. Denies eye redness, eye pain, pressure behind the eyes, facial pain, nasal congestion, ear pain, ringing in the ears, wax buildup, runny nose or bloody nose. Respiratory: Positive cough. Denies difficulty breathing or shortness of breath.  Cardiovascular: Denies chest pain,  chest tightness, palpitations or swelling in the hands or feet.   No other specific complaints in a complete review of systems (except as listed in HPI above).  Objective:   BP 118/78   Pulse 92   Temp 98.6 F (37 C) (Oral)   Wt 163 lb (73.9 kg)   SpO2 98%   BMI 26.31 kg/m   Wt Readings from Last 3 Encounters:  06/15/18 163 lb (73.9 kg)  05/16/18 158 lb 6.4 oz (71.8 kg)  05/16/18 158 lb (71.7 kg)     General: Appears her stated age, well developed, well nourished in NAD. HEENT: Head: normal shape and size, no sinus tenderness noted; Ears: Tm's gray and intact, normal light reflex; Nose: mucosa pink and moist, septum midline; Throat/Mouth: + PND. Teeth present, mucosa erythematous and moist, no exudate noted, no lesions or ulcerations noted.  Neck: Bilateral anterior cervical lymphadenopathy.  Cardiovascular: Normal rate and rhythm. S1,S2 noted.  No murmur, rubs or gallops noted.  Pulmonary/Chest: Normal effort and positive vesicular breath sounds. No respiratory  distress. No wheezes, rales or ronchi noted.       Assessment & Plan:   Upper Respiratory Infection:  Get some rest and drink plenty of water Do salt water gargles for the sore throat 80 mg Depo IM today eRx for Azithromax x 5 days Delsym as needed for cough  RTC as needed or if symptoms persist.   Nicki Reaper, NP

## 2018-06-15 NOTE — Patient Instructions (Signed)
Upper Respiratory Infection, Adult Most upper respiratory infections (URIs) are caused by a virus. A URI affects the nose, throat, and upper air passages. The most common type of URI is often called "the common cold." Follow these instructions at home:  Take medicines only as told by your doctor.  Gargle warm saltwater or take cough drops to comfort your throat as told by your doctor.  Use a warm mist humidifier or inhale steam from a shower to increase air moisture. This may make it easier to breathe.  Drink enough fluid to keep your pee (urine) clear or pale yellow.  Eat soups and other clear broths.  Have a healthy diet.  Rest as needed.  Go back to work when your fever is gone or your doctor says it is okay. ? You may need to stay home longer to avoid giving your URI to others. ? You can also wear a face mask and wash your hands often to prevent spread of the virus.  Use your inhaler more if you have asthma.  Do not use any tobacco products, including cigarettes, chewing tobacco, or electronic cigarettes. If you need help quitting, ask your doctor. Contact a doctor if:  You are getting worse, not better.  Your symptoms are not helped by medicine.  You have chills.  You are getting more short of breath.  You have brown or red mucus.  You have yellow or brown discharge from your nose.  You have pain in your face, especially when you bend forward.  You have a fever.  You have puffy (swollen) neck glands.  You have pain while swallowing.  You have white areas in the back of your throat. Get help right away if:  You have very bad or constant: ? Headache. ? Ear pain. ? Pain in your forehead, behind your eyes, and over your cheekbones (sinus pain). ? Chest pain.  You have long-lasting (chronic) lung disease and any of the following: ? Wheezing. ? Long-lasting cough. ? Coughing up blood. ? A change in your usual mucus.  You have a stiff neck.  You have  changes in your: ? Vision. ? Hearing. ? Thinking. ? Mood. This information is not intended to replace advice given to you by your health care provider. Make sure you discuss any questions you have with your health care provider. Document Released: 02/16/2008 Document Revised: 05/02/2016 Document Reviewed: 12/05/2013 Elsevier Interactive Patient Education  2018 Elsevier Inc.  

## 2018-06-15 NOTE — Addendum Note (Signed)
Addended by: Roena Malady on: 06/15/2018 04:41 PM   Modules accepted: Orders

## 2018-06-19 ENCOUNTER — Encounter: Payer: Self-pay | Admitting: Obstetrics and Gynecology

## 2018-06-19 ENCOUNTER — Ambulatory Visit (INDEPENDENT_AMBULATORY_CARE_PROVIDER_SITE_OTHER): Payer: 59 | Admitting: Obstetrics and Gynecology

## 2018-06-19 VITALS — BP 124/78 | HR 85 | Resp 16 | Ht 66.0 in | Wt 159.0 lb

## 2018-06-19 DIAGNOSIS — Z3009 Encounter for other general counseling and advice on contraception: Secondary | ICD-10-CM
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
## 2018-06-19 NOTE — Progress Notes (Signed)
Last Pap: 06/29/16 Next Due: 06/25/2019
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
## 2018-06-21 ENCOUNTER — Ambulatory Visit (INDEPENDENT_AMBULATORY_CARE_PROVIDER_SITE_OTHER): Payer: 59 | Admitting: Internal Medicine

## 2018-06-21 ENCOUNTER — Encounter (HOSPITAL_COMMUNITY): Payer: Self-pay

## 2018-06-21 ENCOUNTER — Encounter: Payer: Self-pay | Admitting: Internal Medicine

## 2018-06-21 VITALS — BP 114/78 | HR 85 | Temp 98.4°F | Wt 158.0 lb

## 2018-06-21 DIAGNOSIS — R059 Cough, unspecified: Secondary | ICD-10-CM

## 2018-06-21 DIAGNOSIS — R05 Cough: Secondary | ICD-10-CM | POA: Diagnosis not present

## 2018-06-21 MED ORDER — HYDROCODONE-HOMATROPINE 5-1.5 MG/5ML PO SYRP: 5.0000 mL | ORAL_SOLUTION | Freq: Three times a day (TID) | ORAL | 0 refills | Status: DC | PRN

## 2018-06-21 MED ORDER — AMOXICILLIN-POT CLAVULANATE 875-125 MG PO TABS: 1.0000 | ORAL_TABLET | Freq: Two times a day (BID) | ORAL | 0 refills | Status: DC

## 2018-06-21 NOTE — Patient Instructions (Signed)

## 2018-06-21 NOTE — Progress Notes (Signed)
HPI  Pt presents to the clinic today with c/o persistent cough. The cough is productive of green mucous. She denies fever, but has had chills and body aches. She was seen 10/3 for the same. She received Depo Medrol 80 mg IM, and was prescribed Azithromycin x 5 days and advised to take Delsym as needed for cough. She has taken the medication as prescribed but reports overall she feels worse. She has a history of allergies but denies asthma. She is unsure if she has had sick contacts.  Review of Systems      Past Medical History:  Diagnosis Date  . Allergy   . Dysmenorrhea   . Dysthymic disorder   . Exposure to STD   . MRSA infection   . Tobacco use disorder     Family History  Problem Relation Age of Onset  . Cancer Mother 24       one breast  . Cancer Cousin 24       breast  . Anxiety disorder Other   . Cancer Sister 68       lymph node cancer twice  . Anxiety disorder Sister   . Depression Sister     Social History   Socioeconomic History  . Marital status: Divorced    Spouse name: Not on file  . Number of children: Not on file  . Years of education: Not on file  . Highest education level: Not on file  Occupational History  . Not on file  Social Needs  . Financial resource strain: Not on file  . Food insecurity:    Worry: Not on file    Inability: Not on file  . Transportation needs:    Medical: Not on file    Non-medical: Not on file  Tobacco Use  . Smoking status: Current Every Day Smoker    Packs/day: 0.25  . Smokeless tobacco: Never Used  Substance and Sexual Activity  . Alcohol use: No    Alcohol/week: 1.0 standard drinks    Types: 1 Cans of beer per week  . 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 Sexual activity: Not Currently    Partners: Male    Birth control/protection: IUD  Lifestyle  . Physical activity:    Days per week: Not on file    Minutes per session: Not on file  . Stress: Not  on file  Relationships  . Social connections:    Talks on phone: Not on file    Gets together: Not on file    Attends religious service: Not on file    Active member of club or organization: Not on file    Attends meetings of clubs or organizations: Not on file    Relationship status: Not on file  . Intimate partner violence:    Fear of current or ex partner: Not on file    Emotionally abused: Not on file    Physically abused: Not on file    Forced sexual activity: Not on file  Other Topics Concern  . Not on file  Social History Narrative   Lives with mother      Works at [employer]          No Known Allergies   Constitutional: Denies headache, fatigue, fever or abrupt weight changes.  HEENT:  Denies eye redness, eye pain, pressure behind the eyes, facial pain, nasal congestion, ear pain, ringing in the ears, wax buildup,  runny nose or sore throat. Respiratory: Positive cough. Denies difficulty breathing or shortness of breath.  Cardiovascular: Denies chest pain, chest tightness, palpitations or swelling in the hands or feet.   No other specific complaints in a complete review of systems (except as listed in HPI above).  Objective:   BP 114/78   Pulse 85   Temp 98.4 F (36.9 C) (Oral)   Wt 158 lb (71.7 kg)   SpO2 97%   BMI 25.50 kg/m   Wt Readings from Last 3 Encounters:  06/19/18 159 lb (72.1 kg)  06/15/18 163 lb (73.9 kg)  05/16/18 158 lb 6.4 oz (71.8 kg)     General: Appears her stated age, ill appearing, in NAD. HEENT: Head: normal shape and size, no sinus tenderness nted; Ears: Tm's gray and intact, normal light reflex; Nose: mucosa boggy and moist, turbinates swollen, L>R; Throat/Mouth: + PND. Teeth present, mucosa pink and moist, no exudate noted, no lesions or ulcerations noted.  Neck: No cervical lymphadenopathy.  Cardiovascular: Normal rate and rhythm. S1,S2 noted.  No murmur, rubs or gallops noted.  Pulmonary/Chest: Normal effort and positive  vesicular breath sounds. No respiratory distress. No wheezes, rales or ronchi noted.       Assessment & Plan:   Cough:  Get some rest and drink plenty of water Failed Depo Medrol and Zpack Advised her to try Zyrtec and Flonase OTC eRx for Augmentin 875-125 mg BID x 10 days eRx for Hycodan cough syrup  RTC as needed or if symptoms persist.   Nicki Reaper, NP

## 2018-07-11 ENCOUNTER — Ambulatory Visit: Payer: Self-pay

## 2018-07-11 NOTE — Telephone Encounter (Signed)
Pt. Reports she has had a rash that comes and goes, x 2 weeks. Started on her back. Today, it is her neck, hands, fingers. 20-30 "bumps that are the size of a pencil eraser. Very itchy. Will try Hydrocortisone cream. No new soap, detergent or foods. Appointment made for tomorrow, due to work schedule. If symptoms worsen, will go to ED. Reason for Disposition . [1] Severe localized itching AND [2] after 2 days of steroid cream  Answer Assessment - Initial Assessment Questions 1. APPEARANCE of RASH: "Describe the rash."      Small red bumps 2. LOCATION: "Where is the rash located?"      Neck, hands,fingers and arms 3. NUMBER: "How many spots are there?"      20-30 4. SIZE: "How big are the spots?" (Inches, centimeters or compare to size of a coin)      Eraser size 5. ONSET: "When did the rash start?"      2 weeks 6. ITCHING: "Does the rash itch?" If so, ask: "How bad is the itch?"  (Scale 1-10; or mild, moderate, severe)     10 7. PAIN: "Does the rash hurt?" If so, ask: "How bad is the pain?"  (Scale 1-10; or mild, moderate, severe)     No pain 8. OTHER SYMPTOMS: "Do you have any other symptoms?" (e.g., fever)     No 9. PREGNANCY: "Is there any chance you are pregnant?" "When was your last menstrual period?"     No  Protocols used: RASH OR REDNESS - LOCALIZED-A-AH

## 2018-07-11 NOTE — Telephone Encounter (Signed)
I spoke with pt; no fever,pt works at [employer], not sure if exposed to students who have been dx with measles;started with cough 1 month ago; cough is better. No redness in eyes but itching.No [place] spots.  I spoke with Pamala Hurry NP and pt has been seen 06/15/18 and 06/21/18; Pamala Hurry NP will see pt on 07/12/18 at 2 pm for rash only and if pt is concerned prior to appt pt can go to UC> pt voiced understanding and plans to keep appt on 07/12/18. FYI to Pamala Hurry NP.

## 2018-07-12 ENCOUNTER — Ambulatory Visit (INDEPENDENT_AMBULATORY_CARE_PROVIDER_SITE_OTHER): Payer: 59 | Admitting: Internal Medicine

## 2018-07-12 ENCOUNTER — Encounter: Payer: Self-pay | Admitting: Internal Medicine

## 2018-07-12 VITALS — BP 118/74 | HR 74 | Temp 98.4°F | Wt 160.0 lb

## 2018-07-12 DIAGNOSIS — R21 Rash and other nonspecific skin eruption: Secondary | ICD-10-CM

## 2018-07-12 MED ORDER — PREDNISONE 10 MG PO TABS: ORAL_TABLET | ORAL | 0 refills | Status: DC

## 2018-07-12 NOTE — Patient Instructions (Signed)

## 2018-07-12 NOTE — Progress Notes (Signed)
Subjective:    Patient ID: Taylor Bautista, female    DOB: Oct 02, 1990, 27 y.o.   MRN: [id number]  HPI  Pt presents to the clinic today with c/o an intermittent rash. She reports this started about 2 weeks ago. The rash started on her back, and has spread to her neck, arms and hands. The rash is very itchy. She denies changes in soaps, lotions or detergents. No one in her home has a similar rash. She was treated for a cough about 3 weeks ago with Augmentin. She has tried Hydrocortisone Cream with minimal relief.   Review of Systems      Past Medical History:  Diagnosis Date  . Allergy   . Dysmenorrhea   . Dysthymic disorder   . Exposure to STD   . MRSA infection   . Tobacco use disorder     Current Outpatient Medications  Medication Sig Dispense Refill  . amoxicillin-clavulanate (AUGMENTIN) 875-125 MG tablet Take 1 tablet by mouth 2 (two) times daily. 20 tablet 0  . HYDROcodone-homatropine (HYCODAN) 5-1.5 MG/5ML syrup Take 5 mLs by mouth every 8 (eight) hours as needed for cough. 120 mL 0  . medroxyPROGESTERone (DEPO-PROVERA) 150 MG/ML injection Inject 1 mL (150 mg total) into the muscle every 3 (three) months. 1 mL 5  . vitamin B-12 (CYANOCOBALAMIN) 500 MCG tablet Take 500 mcg by mouth daily.     No current facility-administered medications for this visit.     No Known Allergies  Family History  Problem Relation Age of Onset  . Cancer Mother 21       one breast  . Cancer Cousin 24       breast  . Anxiety disorder Other   . Cancer Sister 55       lymph node cancer twice  . Anxiety disorder Sister   . Depression Sister     Social History   Socioeconomic History  . Marital status: Divorced    Spouse name: Not on file  . Number of children: Not on file  . Years of education: Not on file  . Highest education level: Not on file  Occupational History  . Not on file  Social Needs  . Financial resource strain: Not on file  . Food insecurity:    Worry: Not on file     Inability: Not on file  . Transportation needs:    Medical: Not on file    Non-medical: Not on file  Tobacco Use  . Smoking status: Current Every Day Smoker    Packs/day: 0.25  . Smokeless tobacco: Never Used  Substance and Sexual Activity  . Alcohol use: No    Alcohol/week: 1.0 standard drinks    Types: 1 Cans of beer per week  . 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 Sexual activity: Not Currently    Partners: Male    Birth control/protection: IUD  Lifestyle  . Physical activity:    Days per week: Not on file    Minutes per session: Not on file  . Stress: Not on file  Relationships  . Social connections:    Talks on phone: Not on file    Gets together: Not on file    Attends religious service: Not on file    Active member of club or organization: Not on file    Attends meetings of clubs or organizations: Not on file    Relationship status: Not  on file  . Intimate partner violence:    Fear of current or ex partner: Not on file    Emotionally abused: Not on file    Physically abused: Not on file    Forced sexual activity: Not on file  Other Topics Concern  . Not on file  Social History Narrative   Lives with mother      Works at [employer]           Constitutional: Denies fever, malaise, fatigue, headache or abrupt weight changes.  Respiratory: Denies difficulty breathing, shortness of breath, cough or sputum production.   Cardiovascular: Denies chest pain, chest tightness, palpitations or swelling in the hands or feet.  Skin: Pt reports rash. Denies lesions or ulcercations.    No other specific complaints in a complete review of systems (except as listed in HPI above).  Objective:   Physical Exam  BP 118/74   Pulse 74   Temp 98.4 F (36.9 C) (Oral)   Wt 160 lb (72.6 kg)   SpO2 98%   BMI 25.82 kg/m  Wt Readings from Last 3 Encounters:  07/12/18 160 lb (72.6 kg)  06/21/18 158 lb (71.7  kg)  06/19/18 159 lb (72.1 kg)    General: Appears her stated age, well developed, well nourished in NAD. Skin: Scattered papules noted on left side of neck, right hip. Excoriation noted of right hip.  BMET    Component Value Date/Time   NA 140 02/01/2018 1453   K 3.7 02/01/2018 1453   CL 106 02/01/2018 1453   CO2 28 02/01/2018 1453   GLUCOSE 73 02/01/2018 1453   BUN 9 02/01/2018 1453   CREATININE 0.72 02/01/2018 1453   CALCIUM 9.4 02/01/2018 1453   GFRNONAA >60 04/28/2015 2305   GFRAA >60 04/28/2015 2305    Lipid Panel     Component Value Date/Time   CHOL 162 02/01/2018 1453   TRIG 124.0 02/01/2018 1453   HDL 48.90 02/01/2018 1453   CHOLHDL 3 02/01/2018 1453   VLDL 24.8 02/01/2018 1453   LDLCALC 88 02/01/2018 1453    CBC    Component Value Date/Time   WBC 5.9 02/01/2018 1453   RBC 4.41 02/01/2018 1453   HGB 13.9 02/01/2018 1453   HCT 40.1 02/01/2018 1453   PLT 259.0 02/01/2018 1453   MCV 91.0 02/01/2018 1453   MCH 31.0 04/28/2015 2305   MCHC 34.6 02/01/2018 1453   RDW 12.6 02/01/2018 1453   LYMPHSABS 1.6 09/23/2017 1259   MONOABS 0.5 09/23/2017 1259   EOSABS 0.3 09/23/2017 1259   BASOSABS 0.0 09/23/2017 1259    Hgb A1C No results found for: HGBA1C          Assessment & Plan:   Rash:  Appears allergic eRx for Pred Taper x 6 days, avoid OTC NSAID's Ok to use Hydrocortisone cream prn If no improvement, can refer to allergy  Return precautions discussed Nicki Reaper, NP

## 2018-07-18 ENCOUNTER — Encounter: Payer: Self-pay | Admitting: Internal Medicine

## 2018-07-18 ENCOUNTER — Ambulatory Visit (INDEPENDENT_AMBULATORY_CARE_PROVIDER_SITE_OTHER): Payer: 59 | Admitting: Internal Medicine

## 2018-07-18 VITALS — BP 118/74 | HR 97 | Temp 98.5°F | Wt 160.0 lb

## 2018-07-18 DIAGNOSIS — R21 Rash and other nonspecific skin eruption: Secondary | ICD-10-CM

## 2018-07-18 MED ORDER — PERMETHRIN 5 % EX CREA: 1.0000 "application " | TOPICAL_CREAM | Freq: Once | CUTANEOUS | 0 refills | Status: AC

## 2018-07-18 MED ORDER — CYCLOBENZAPRINE HCL 5 MG PO TABS: 5.0000 mg | ORAL_TABLET | Freq: Every day | ORAL | 0 refills | Status: DC

## 2018-07-18 NOTE — Progress Notes (Signed)
Subjective:    Patient ID: Taylor Bautista, female    DOB: April 04, 1991, 27 y.o.   MRN: [id number]  HPI  Pt presents to the clinic today with c/o persistent rash. She was initially seen for a rash on her right arm 9/3. She was treated for possible shingles vs insect bite at that time with Valtrex and Triamcinolone Cream. She was see 10/30 for similar rash that had spread to back, neck and arms. She was treated with Prednisone and Hydrocortisone cream. She returns today without any improvement. The rash persist on her neck, back and arms. The rash is very itchy. No one in her home has a similar rash. She has not come in contact with anything that she is allergic to.   Review of Systems  Past Medical History:  Diagnosis Date  . Allergy   . Dysmenorrhea   . Dysthymic disorder   . Exposure to STD   . MRSA infection   . Tobacco use disorder     Current Outpatient Medications  Medication Sig Dispense Refill  . medroxyPROGESTERone (DEPO-PROVERA) 150 MG/ML injection Inject 1 mL (150 mg total) into the muscle every 3 (three) months. 1 mL 5  . vitamin B-12 (CYANOCOBALAMIN) 500 MCG tablet Take 500 mcg by mouth daily.    . cyclobenzaprine (FLEXERIL) 5 MG tablet Take 1 tablet (5 mg total) by mouth at bedtime. 15 tablet 0  . permethrin (ELIMITE) 5 % cream Apply 1 application topically once for 1 dose. 60 g 0   No current facility-administered medications for this visit.     No Known Allergies  Family History  Problem Relation Age of Onset  . Cancer Mother 41       one breast  . Cancer Cousin 24       breast  . Anxiety disorder Other   . Cancer Sister 71       lymph node cancer twice  . Anxiety disorder Sister   . Depression Sister     Social History   Socioeconomic History  . Marital status: Divorced    Spouse name: Not on file  . Number of children: Not on file  . Years of education: Not on file  . Highest education level: Not on file  Occupational History  . Not on file    Social Needs  . Financial resource strain: Not on file  . Food insecurity:    Worry: Not on file    Inability: Not on file  . Transportation needs:    Medical: Not on file    Non-medical: Not on file  Tobacco Use  . Smoking status: Current Every Day Smoker    Packs/day: 0.25  . Smokeless tobacco: Never Used  Substance and Sexual Activity  . Alcohol use: No    Alcohol/week: 1.0 standard drinks    Types: 1 Cans of beer per week  . 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 Sexual activity: Not Currently    Partners: Male    Birth control/protection: IUD  Lifestyle  . Physical activity:    Days per week: Not on file    Minutes per session: Not on file  . Stress: Not on file  Relationships  . Social connections:    Talks on phone: Not on file    Gets together: Not on file    Attends religious service: Not on file    Active member of club or organization:  Not on file    Attends meetings of clubs or organizations: Not on file    Relationship status: Not on file  . Intimate partner violence:    Fear of current or ex partner: Not on file    Emotionally abused: Not on file    Physically abused: Not on file    Forced sexual activity: Not on file  Other Topics Concern  . Not on file  Social History Narrative   Lives with mother      Works at [employer]           Constitutional: Denies fever, malaise, fatigue, headache or abrupt weight changes.  Skin: Pt reports rash. Denies ulcercations.    No other specific complaints in a complete review of systems (except as listed in HPI above).     Objective:   Physical Exam   BP 118/74   Pulse 97   Temp 98.5 F (36.9 C) (Oral)   Wt 160 lb (72.6 kg)   SpO2 97%   BMI 25.82 kg/m  Wt Readings from Last 3 Encounters:  07/18/18 160 lb (72.6 kg)  07/12/18 160 lb (72.6 kg)  06/21/18 158 lb (71.7 kg)    General: Appears her stated age, well developed, well  nourished in NAD. Skin: Grouped erythematous papules noted on left side of neck, right side of back and right forearm. Excoriation noted to bilateral forearms. Marland Kitchen  BMET    Component Value Date/Time   NA 140 02/01/2018 1453   K 3.7 02/01/2018 1453   CL 106 02/01/2018 1453   CO2 28 02/01/2018 1453   GLUCOSE 73 02/01/2018 1453   BUN 9 02/01/2018 1453   CREATININE 0.72 02/01/2018 1453   CALCIUM 9.4 02/01/2018 1453   GFRNONAA >60 04/28/2015 2305   GFRAA >60 04/28/2015 2305    Lipid Panel     Component Value Date/Time   CHOL 162 02/01/2018 1453   TRIG 124.0 02/01/2018 1453   HDL 48.90 02/01/2018 1453   CHOLHDL 3 02/01/2018 1453   VLDL 24.8 02/01/2018 1453   LDLCALC 88 02/01/2018 1453    CBC    Component Value Date/Time   WBC 5.9 02/01/2018 1453   RBC 4.41 02/01/2018 1453   HGB 13.9 02/01/2018 1453   HCT 40.1 02/01/2018 1453   PLT 259.0 02/01/2018 1453   MCV 91.0 02/01/2018 1453   MCH 31.0 04/28/2015 2305   MCHC 34.6 02/01/2018 1453   RDW 12.6 02/01/2018 1453   LYMPHSABS 1.6 09/23/2017 1259   MONOABS 0.5 09/23/2017 1259   EOSABS 0.3 09/23/2017 1259   BASOSABS 0.0 09/23/2017 1259    Hgb A1C No results found for: HGBA1C            Assessment & Plan:   Rash:  Failed antivirals, topical and oral steroids Will trial Permethrin, eRx sent to pharmacy Referral placed to derm for further evaluation Continue Triamcinolone prn and Zyrtec OTC  Return precautions discussed Nicki Reaper, NP

## 2018-07-20 ENCOUNTER — Encounter: Payer: Self-pay | Admitting: Internal Medicine

## 2018-07-20 NOTE — Patient Instructions (Signed)

## 2018-07-24 ENCOUNTER — Encounter (HOSPITAL_BASED_OUTPATIENT_CLINIC_OR_DEPARTMENT_OTHER): Payer: Self-pay

## 2018-07-24 ENCOUNTER — Other Ambulatory Visit: Payer: Self-pay

## 2018-08-01 ENCOUNTER — Encounter: Payer: Self-pay | Admitting: Internal Medicine

## 2018-08-01 ENCOUNTER — Ambulatory Visit (INDEPENDENT_AMBULATORY_CARE_PROVIDER_SITE_OTHER): Payer: 59 | Admitting: Internal Medicine

## 2018-08-01 VITALS — BP 122/78 | HR 98 | Temp 98.1°F | Wt 161.0 lb

## 2018-08-01 DIAGNOSIS — M5441 Lumbago with sciatica, right side: Secondary | ICD-10-CM

## 2018-08-01 MED ORDER — KETOROLAC TROMETHAMINE 30 MG/ML IJ SOLN
30.0000 mg | Freq: Once | INTRAMUSCULAR | Status: AC
  Administered 2018-08-01: 30 mg via INTRAMUSCULAR

## 2018-08-01 MED ORDER — CYCLOBENZAPRINE HCL 5 MG PO TABS: 5.0000 mg | ORAL_TABLET | Freq: Every day | ORAL | 0 refills | Status: DC

## 2018-08-01 MED ORDER — PREDNISONE 10 MG PO TABS: ORAL_TABLET | ORAL | 0 refills | Status: DC

## 2018-08-01 NOTE — Progress Notes (Signed)
Subjective:    Patient ID: Taylor Bautista, female    DOB: [birth year], 27 y.o.   MRN: [id number]  HPI  Pt presents to the clinic today with c/o "extreme" back pain. She reports this started 3 days ago. She describes the pain as sharp and shooting. The pain radiates into her right leg. She denies numbness, tingling or weakness. She denies an injury to the area. She denies bowel or bladder incontinence. She has tried heat, Ibuprofen and Aleve with minimal relief. She had a normal xray of her lumbar spine 05/2016.  Review of Systems      Past Medical History:  Diagnosis Date  . Allergy   . Anxiety    no meds  . Dysmenorrhea   . Dysthymic disorder   . Exposure to STD   . MRSA infection   . Sleep apnea    pt states she does not use her CPAP   . Tobacco use disorder     Current Outpatient Medications  Medication Sig Dispense Refill  . ibuprofen (ADVIL,MOTRIN) 200 MG tablet Take 200 mg by mouth every 6 (six) hours as needed.    . medroxyPROGESTERone (DEPO-PROVERA) 150 MG/ML injection Inject 1 mL (150 mg total) into the muscle every 3 (three) months. 1 mL 5  . vitamin B-12 (CYANOCOBALAMIN) 500 MCG tablet Take 500 mcg by mouth daily.     No current facility-administered medications for this visit.     No Known Allergies  Family History  Problem Relation Age of Onset  . Cancer Mother 80       one breast  . Cancer Cousin 24       breast  . Anxiety disorder Other   . Cancer Sister 38       lymph node cancer twice  . Anxiety disorder Sister   . Depression Sister     Social History   Socioeconomic History  . Marital status: Divorced    Spouse name: Not on file  . Number of children: Not on file  . Years of education: Not on file  . Highest education level: Not on file  Occupational History  . Not on file  Social Needs  . Financial resource strain: Not on file  . Food insecurity:    Worry: Not on file    Inability: Not on file  . Transportation needs:    Medical:  Not on file    Non-medical: Not on file  Tobacco Use  . Smoking status: Current Every Day Smoker    Packs/day: 0.25    Types: Cigarettes  . Smokeless tobacco: Never Used  Substance and Sexual Activity  . Alcohol use: No    Alcohol/week: 1.0 standard drinks    Types: 1 Cans of beer per week  . 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 Sexual activity: Not Currently    Partners: Male    Birth control/protection: IUD  Lifestyle  . Physical activity:    Days per week: Not on file    Minutes per session: Not on file  . Stress: Not on file  Relationships  . Social connections:    Talks on phone: Not on file    Gets together: Not on file    Attends religious service: Not on file    Active member of club or organization: Not on file    Attends meetings of clubs or organizations: Not on file    Relationship  status: Not on file  . Intimate partner violence:    Fear of current or ex partner: Not on file    Emotionally abused: Not on file    Physically abused: Not on file    Forced sexual activity: Not on file  Other Topics Concern  . Not on file  Social History Narrative   Lives with mother      Works at [employer]           Constitutional: Denies fever, malaise, fatigue, headache or abrupt weight changes.  Gastrointestinal: Denies abdominal pain, bloating, constipation, diarrhea or blood in the stool.  GU: Denies urgency, frequency, pain with urination, burning sensation, blood in urine, odor or discharge. Musculoskeletal: Pt reports back pain. Denies decrease in range of motion, difficulty with gait, or joint swelling.  Neurological: Denies numbness, tingling, weakness or problems with balance and coordination.    No other specific complaints in a complete review of systems (except as listed in HPI above).  Objective:   Physical Exam   BP 122/78   Pulse 98   Temp 98.1 F (36.7 C) (Oral)   Wt 161 lb (73 kg)    SpO2 98%   BMI 25.99 kg/m  Wt Readings from Last 3 Encounters:  08/01/18 161 lb (73 kg)  07/18/18 160 lb (72.6 kg)  07/12/18 160 lb (72.6 kg)    General: Appears her stated age, well developed, well nourished in NAD. Skin: Warm, dry and intact. Recent biopsy noted of right flank, dressing clean and intact. Cardiovascular: Normal rate and rhythm.  Pulmonary/Chest: Normal effort and positive vesicular breath sounds. No respiratory distress. No wheezes, rales or ronchi noted.  Abdomen: Soft and nontender. Normal bowel sounds. No distention or masses noted.  Musculoskeletal: Decreased flexion and extension of the spine due to pain. Normal rotation. Bony tenderness noted over the lumbar spine and right SI joint. Normal abduction and adduction of the right hip. Pain with flexion, extension, internal and external rotation of the right hip. No bony tenderness noted over there right hip. Strength 5/5 BLE. Able to walk on heels and toes. No difficulty with gait.  Neurological: Alert and oriented. Negative SLR on the right.   BMET    Component Value Date/Time   NA 140 02/01/2018 1453   K 3.7 02/01/2018 1453   CL 106 02/01/2018 1453   CO2 28 02/01/2018 1453   GLUCOSE 73 02/01/2018 1453   BUN 9 02/01/2018 1453   CREATININE 0.72 02/01/2018 1453   CALCIUM 9.4 02/01/2018 1453   GFRNONAA >60 04/28/2015 2305   GFRAA >60 04/28/2015 2305    Lipid Panel     Component Value Date/Time   CHOL 162 02/01/2018 1453   TRIG 124.0 02/01/2018 1453   HDL 48.90 02/01/2018 1453   CHOLHDL 3 02/01/2018 1453   VLDL 24.8 02/01/2018 1453   LDLCALC 88 02/01/2018 1453    CBC    Component Value Date/Time   WBC 5.9 02/01/2018 1453   RBC 4.41 02/01/2018 1453   HGB 13.9 02/01/2018 1453   HCT 40.1 02/01/2018 1453   PLT 259.0 02/01/2018 1453   MCV 91.0 02/01/2018 1453   MCH 31.0 04/28/2015 2305   MCHC 34.6 02/01/2018 1453   RDW 12.6 02/01/2018 1453   LYMPHSABS 1.6 09/23/2017 1259   MONOABS 0.5  09/23/2017 1259   EOSABS 0.3 09/23/2017 1259   BASOSABS 0.0 09/23/2017 1259    Hgb A1C No results found for: HGBA1C  Assessment & Plan:   Acute Midline Low Back Pain with Right Sided Sciatica:  Toradol 30 mg IM today eRx for Pred Taper x 9 days (Avoid OTC NSAID's) eRx for Flexeril 5 mg QHS prn- sedation caution given Continue heat as needed Back stretching exercise given If deteriorates, consider xray, PT  Return precautions discussed Nicki Reaperegina Kailen Hinkle, NP

## 2018-08-01 NOTE — Patient Instructions (Signed)

## 2018-08-01 NOTE — Addendum Note (Signed)
Addended by: Roena MaladyEVONTENNO, Octavion Mollenkopf Y on: 08/01/2018 10:29 AM   Modules accepted: Orders

## 2018-08-02 ENCOUNTER — Encounter (HOSPITAL_BASED_OUTPATIENT_CLINIC_OR_DEPARTMENT_OTHER)
Admission: RE | Disposition: A | Discharge status: Home / Self Care | Payer: Self-pay | Source: Ambulatory Visit | Attending: Obstetrics and Gynecology

## 2018-08-02 ENCOUNTER — Encounter (HOSPITAL_BASED_OUTPATIENT_CLINIC_OR_DEPARTMENT_OTHER): Payer: Self-pay

## 2018-08-02 ENCOUNTER — Ambulatory Visit (HOSPITAL_BASED_OUTPATIENT_CLINIC_OR_DEPARTMENT_OTHER): Payer: 59 | Admitting: Anesthesiology

## 2018-08-02 ENCOUNTER — Ambulatory Visit (HOSPITAL_BASED_OUTPATIENT_CLINIC_OR_DEPARTMENT_OTHER)
Admission: RE | Admit: 2018-08-02 | Discharge: 2018-08-02 | Disposition: A | Discharge status: Home / Self Care | Payer: 59 | Source: Ambulatory Visit | Attending: Obstetrics and Gynecology | Admitting: Obstetrics and Gynecology

## 2018-08-02 ENCOUNTER — Other Ambulatory Visit: Payer: Self-pay

## 2018-08-02 DIAGNOSIS — Z9889 Other specified postprocedural states: Secondary | ICD-10-CM

## 2018-08-02 DIAGNOSIS — Z302 Encounter for sterilization: Secondary | ICD-10-CM | POA: Diagnosis not present

## 2018-08-02 DIAGNOSIS — F17210 Nicotine dependence, cigarettes, uncomplicated: Secondary | ICD-10-CM | POA: Diagnosis not present

## 2018-08-02 HISTORY — PX: LAPAROSCOPIC TUBAL LIGATION: SHX1937

## 2018-08-02 HISTORY — DX: Sleep apnea, unspecified: G47.30

## 2018-08-02 HISTORY — DX: Anxiety disorder, unspecified: F41.9

## 2018-08-02 LAB — POCT PREGNANCY, URINE: PREG TEST UR: NEGATIVE

## 2018-08-02 SURGERY — LIGATION, FALLOPIAN TUBE, LAPAROSCOPIC
Anesthesia: General | Site: Abdomen | Laterality: Bilateral

## 2018-08-02 MED ORDER — SUGAMMADEX SODIUM 200 MG/2ML IV SOLN
INTRAVENOUS | Status: AC
  Filled 2018-08-02: qty 2

## 2018-08-02 MED ORDER — SUCCINYLCHOLINE CHLORIDE 200 MG/10ML IV SOSY
PREFILLED_SYRINGE | INTRAVENOUS | Status: AC
  Filled 2018-08-02: qty 10

## 2018-08-02 MED ORDER — PHENYLEPHRINE 40 MCG/ML (10ML) SYRINGE FOR IV PUSH (FOR BLOOD PRESSURE SUPPORT)
PREFILLED_SYRINGE | INTRAVENOUS | Status: AC
  Filled 2018-08-02: qty 10

## 2018-08-02 MED ORDER — MIDAZOLAM HCL 2 MG/2ML IJ SOLN
INTRAMUSCULAR | Status: AC
  Filled 2018-08-02: qty 2

## 2018-08-02 MED ORDER — SOD CITRATE-CITRIC ACID 500-334 MG/5ML PO SOLN
ORAL | Status: AC
  Filled 2018-08-02: qty 15

## 2018-08-02 MED ORDER — CELECOXIB 200 MG PO CAPS
ORAL_CAPSULE | ORAL | Status: AC
  Filled 2018-08-02: qty 1

## 2018-08-02 MED ORDER — DEXMEDETOMIDINE HCL IN NACL 200 MCG/50ML IV SOLN
INTRAVENOUS | Status: DC | PRN
  Administered 2018-08-02: 18 ug via INTRAVENOUS

## 2018-08-02 MED ORDER — OXYCODONE HCL 5 MG/5ML PO SOLN: 5.0000 mg | Freq: Once | ORAL | Status: DC | PRN

## 2018-08-02 MED ORDER — ACETAMINOPHEN 160 MG/5ML PO SOLN: 960.0000 mg | Freq: Once | ORAL | Status: AC

## 2018-08-02 MED ORDER — DEXAMETHASONE SODIUM PHOSPHATE 10 MG/ML IJ SOLN
INTRAMUSCULAR | Status: AC
  Filled 2018-08-02: qty 1

## 2018-08-02 MED ORDER — PROPOFOL 500 MG/50ML IV EMUL
INTRAVENOUS | Status: AC
  Filled 2018-08-02: qty 50

## 2018-08-02 MED ORDER — OXYCODONE HCL 5 MG PO TABS
ORAL_TABLET | ORAL | Status: AC
  Filled 2018-08-02: qty 1

## 2018-08-02 MED ORDER — FENTANYL CITRATE (PF) 100 MCG/2ML IJ SOLN
INTRAMUSCULAR | Status: AC
  Filled 2018-08-02: qty 2

## 2018-08-02 MED ORDER — ROCURONIUM BROMIDE 100 MG/10ML IV SOLN
INTRAVENOUS | Status: DC | PRN
  Administered 2018-08-02: 30 mg via INTRAVENOUS

## 2018-08-02 MED ORDER — LACTATED RINGERS IV SOLN
INTRAVENOUS | Status: DC
  Administered 2018-08-02 (×2): via INTRAVENOUS

## 2018-08-02 MED ORDER — OXYCODONE-ACETAMINOPHEN 5-325 MG PO TABS: 1.0000 | ORAL_TABLET | Freq: Four times a day (QID) | ORAL | 0 refills | Status: DC | PRN

## 2018-08-02 MED ORDER — LACTATED RINGERS IV SOLN: INTRAVENOUS | Status: DC

## 2018-08-02 MED ORDER — BUPIVACAINE HCL (PF) 0.5 % IJ SOLN
INTRAMUSCULAR | Status: AC
  Filled 2018-08-02: qty 30

## 2018-08-02 MED ORDER — LIDOCAINE 2% (20 MG/ML) 5 ML SYRINGE
INTRAMUSCULAR | Status: AC
  Filled 2018-08-02: qty 5

## 2018-08-02 MED ORDER — BUPIVACAINE HCL 0.5 % IJ SOLN
INTRAMUSCULAR | Status: DC | PRN
  Administered 2018-08-02: 10 mL

## 2018-08-02 MED ORDER — FENTANYL CITRATE (PF) 100 MCG/2ML IJ SOLN
50.0000 ug | INTRAMUSCULAR | Status: DC | PRN
  Administered 2018-08-02: 100 ug via INTRAVENOUS

## 2018-08-02 MED ORDER — OXYCODONE HCL 5 MG PO TABS: 5.0000 mg | ORAL_TABLET | Freq: Once | ORAL | Status: DC | PRN

## 2018-08-02 MED ORDER — ONDANSETRON HCL 4 MG/2ML IJ SOLN
INTRAMUSCULAR | Status: AC
  Filled 2018-08-02: qty 2

## 2018-08-02 MED ORDER — DEXAMETHASONE SODIUM PHOSPHATE 4 MG/ML IJ SOLN
INTRAMUSCULAR | Status: DC | PRN
  Administered 2018-08-02: 10 mg via INTRAVENOUS

## 2018-08-02 MED ORDER — SOD CITRATE-CITRIC ACID 500-334 MG/5ML PO SOLN
30.0000 mL | ORAL | Status: AC
  Administered 2018-08-02: 30 mL via ORAL

## 2018-08-02 MED ORDER — EPHEDRINE 5 MG/ML INJ
INTRAVENOUS | Status: AC
  Filled 2018-08-02: qty 10

## 2018-08-02 MED ORDER — OXYCODONE-ACETAMINOPHEN 5-325 MG PO TABS
ORAL_TABLET | ORAL | Status: AC
  Filled 2018-08-02: qty 1

## 2018-08-02 MED ORDER — PROPOFOL 10 MG/ML IV BOLUS
INTRAVENOUS | Status: DC | PRN
  Administered 2018-08-02: 200 mg via INTRAVENOUS

## 2018-08-02 MED ORDER — HYDROMORPHONE HCL 1 MG/ML IJ SOLN
INTRAMUSCULAR | Status: AC
  Filled 2018-08-02: qty 0.5

## 2018-08-02 MED ORDER — SCOPOLAMINE 1 MG/3DAYS TD PT72: 1.0000 | MEDICATED_PATCH | Freq: Once | TRANSDERMAL | Status: DC | PRN

## 2018-08-02 MED ORDER — ACETAMINOPHEN 500 MG PO TABS
1000.0000 mg | ORAL_TABLET | Freq: Once | ORAL | Status: AC
  Administered 2018-08-02: 1000 mg via ORAL

## 2018-08-02 MED ORDER — PROMETHAZINE HCL 25 MG/ML IJ SOLN: 6.2500 mg | INTRAMUSCULAR | Status: DC | PRN

## 2018-08-02 MED ORDER — LIDOCAINE HCL (PF) 1 % IJ SOLN
INTRAMUSCULAR | Status: AC
  Filled 2018-08-02: qty 30

## 2018-08-02 MED ORDER — SUGAMMADEX SODIUM 200 MG/2ML IV SOLN
INTRAVENOUS | Status: DC | PRN
  Administered 2018-08-02: 200 mg via INTRAVENOUS

## 2018-08-02 MED ORDER — MIDAZOLAM HCL 2 MG/2ML IJ SOLN
1.0000 mg | INTRAMUSCULAR | Status: DC | PRN
  Administered 2018-08-02: 2 mg via INTRAVENOUS

## 2018-08-02 MED ORDER — HYDROMORPHONE HCL 1 MG/ML IJ SOLN
0.2500 mg | INTRAMUSCULAR | Status: DC | PRN
  Administered 2018-08-02: 0.5 mg via INTRAVENOUS

## 2018-08-02 MED ORDER — LIDOCAINE HCL (CARDIAC) PF 100 MG/5ML IV SOSY
PREFILLED_SYRINGE | INTRAVENOUS | Status: DC | PRN
  Administered 2018-08-02: 50 mg via INTRAVENOUS

## 2018-08-02 MED ORDER — CELECOXIB 200 MG PO CAPS
200.0000 mg | ORAL_CAPSULE | Freq: Once | ORAL | Status: AC | PRN
  Administered 2018-08-02: 200 mg via ORAL

## 2018-08-02 MED ORDER — ROCURONIUM BROMIDE 50 MG/5ML IV SOSY
PREFILLED_SYRINGE | INTRAVENOUS | Status: AC
  Filled 2018-08-02: qty 5

## 2018-08-02 MED ORDER — SILVER NITRATE-POT NITRATE 75-25 % EX MISC
CUTANEOUS | Status: AC
  Filled 2018-08-02: qty 1

## 2018-08-02 MED ORDER — ACETAMINOPHEN 500 MG PO TABS
ORAL_TABLET | ORAL | Status: AC
  Filled 2018-08-02: qty 2

## 2018-08-02 SURGICAL SUPPLY — 40 items
ADH SKN CLS APL DERMABOND .7 (GAUZE/BANDAGES/DRESSINGS) ×1
BLADE SURG 15 STRL LF DISP TIS (BLADE) ×1 IMPLANT
BLADE SURG 15 STRL SS (BLADE) ×2
BRIEF STRETCH FOR OB PAD XXL (UNDERPADS AND DIAPERS) ×2 IMPLANT
CLIP FILSHIE TUBAL LIGA STRL (Clip) ×1 IMPLANT
DERMABOND ADVANCED (GAUZE/BANDAGES/DRESSINGS) ×1
DERMABOND ADVANCED .7 DNX12 (GAUZE/BANDAGES/DRESSINGS) ×1 IMPLANT
DRSG OPSITE POSTOP 3X4 (GAUZE/BANDAGES/DRESSINGS) ×2 IMPLANT
DURAPREP 26ML APPLICATOR (WOUND CARE) ×2 IMPLANT
GLOVE BIOGEL PI IND STRL 7.0 (GLOVE) ×2 IMPLANT
GLOVE BIOGEL PI IND STRL 7.5 (GLOVE) ×2 IMPLANT
GLOVE BIOGEL PI INDICATOR 7.0 (GLOVE) ×2
GLOVE BIOGEL PI INDICATOR 7.5 (GLOVE) ×2
GLOVE SURG SS PI 7.0 STRL IVOR (GLOVE) ×2 IMPLANT
GOWN STRL REUS W/TWL LRG LVL3 (GOWN DISPOSABLE) ×5 IMPLANT
NDL INSUFFLATION 14GA 120MM (NEEDLE) IMPLANT
NEEDLE INSUFFLATION 14GA 120MM (NEEDLE) IMPLANT
NEEDLE INSUFFLATION 150MM (ENDOMECHANICALS) IMPLANT
PACK LAPAROSCOPY BASIN (CUSTOM PROCEDURE TRAY) ×2 IMPLANT
PACK TRENDGUARD 450 HYBRID PRO (MISCELLANEOUS) IMPLANT
PACK TRENDGUARD 600 HYBRD PROC (MISCELLANEOUS) IMPLANT
PAD ARMBOARD 7.5X6 YLW CONV (MISCELLANEOUS) ×3 IMPLANT
PAD OB MATERNITY 4.3X12.25 (PERSONAL CARE ITEMS) ×2 IMPLANT
PAD PREP 24X48 CUFFED NSTRL (MISCELLANEOUS) ×2 IMPLANT
SUT MNCRL AB 4-0 PS2 18 (SUTURE) ×2 IMPLANT
SUT PLAIN 2 0 (SUTURE)
SUT PLAIN ABS 2-0 CT1 27XMFL (SUTURE) IMPLANT
SUT VICRYL 0 UR6 27IN ABS (SUTURE) ×2 IMPLANT
SUT VICRYL 4-0 PS2 18IN ABS (SUTURE) IMPLANT
TOWEL GREEN STERILE FF (TOWEL DISPOSABLE) ×2 IMPLANT
TRAY FOLEY W/BAG SLVR 14FR LF (SET/KITS/TRAYS/PACK) ×1 IMPLANT
TRENDGUARD 450 HYBRID PRO PACK (MISCELLANEOUS) ×2
TRENDGUARD 600 HYBRID PROC PK (MISCELLANEOUS)
TROCAR 5M 150ML BLDLS (TROCAR) IMPLANT
TROCAR ADV FIXATION 5X100MM (TROCAR) IMPLANT
TROCAR BALLN 12MMX100 BLUNT (TROCAR) ×3 IMPLANT
TROCAR BALLN GELPORT 12X130M (ENDOMECHANICALS) IMPLANT
TROCAR XCEL NON-BLD 11X100MML (ENDOMECHANICALS) IMPLANT
TUBING INSUFFLATION (TUBING) ×2 IMPLANT
WARMER LAPAROSCOPE (MISCELLANEOUS) ×2 IMPLANT
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
## 2018-08-02 NOTE — Discharge Instructions (Addendum)
Post Anesthesia Home Care Instructions  Activity: Get plenty of rest for the remainder of the day. A responsible individual must stay with you for 24 hours following the procedure.  For the next 24 hours, DO NOT: -Drive a car -Advertising copywriterperate machinery -Drink alcoholic beverages -Take any medication unless instructed by your physician -Make any legal decisions or sign important papers.  Meals: Start with liquid foods such as gelatin or soup. Progress to regular foods as tolerated. Avoid greasy, spicy, heavy foods. If nausea and/or vomiting occur, drink only clear liquids until the nausea and/or vomiting subsides. Call your physician if vomiting continues.  Special Instructions/Symptoms: Your throat may feel dry or sore from the anesthesia or the breathing tube placed in your throat during surgery. If this causes discomfort, gargle with warm salt water. The discomfort should disappear within 24 hours.  If you had a scopolamine patch placed behind your ear for the management of post- operative nausea and/or vomiting:  1. The medication in the patch is effective for 72 hours, after which it should be removed.  Wrap patch in a tissue and discard in the trash. Wash hands thoroughly with soap and water. 2. You may remove the patch earlier than 72 hours if you experience unpleasant side effects which may include dry mouth, dizziness or visual disturbances. 3. Avoid touching the patch. Wash your hands with soap and water after contact with the patch.   Laparoscopic Surgery Discharge Instructions  You have just undergone a laparoscopic surgery.  The following list should answer your most common questions.  Although we will discuss your surgery and post-operative instructions with you prior to your discharge, this list will serve as a reminder if you fail to recall the details of what we discussed.  We will discuss your surgery once again in detail at your post-op visit in two to four weeks. If you  havent already done so, please call to make your appointment as soon as possible.  How you will feel: Although you have just undergone a major surgery, your recovery will be significantly shorter since the surgery was performed through much smaller incisions than the traditional approach.  You should feel slightly better each day.  If you suddenly feel much worse than the prior day, please call the clinic.  Its important during the early part of your recovery that you maintain some activity.  Walking is encouraged.  You will quicken your recovery by continued activity.  Incision:  Your incisions will be closed with dissolvable stitches or surgical adhesive (glue).  There may be Band-aids and/or Steri-strips covering your incisions.  If there is no drainage from the incisions you may remove the Band-aids in one to two days.  You may notice some minor bruising at the incision sites.  This is common and will resolve within several days.  Please inform us if the redness at the edges of your incision appears to be spreading.  If the skin around your incision becomes warm to the touch, or if you notice a pus-like drainage, please call the office.   Stairs/Driving/Activities: You may climb stairs if necessary.  If youve had general anesthesia, do not drive a car the rest of the day today.  You may begin light housework when you feel up to it, but avoid heavy lifting or bending over (more than 15-20lbs) or pushing until cleared for these activities by your physician.  Hygiene:  Do not soak your incisions.  Showers are acceptable but you may not take a  bath or swim in a pool.  Cleanse your incisions daily with soap and water.  Medications:  Please resume taking any medications that you were taking prior to the surgery.  If we have prescribed any new medications for you, please take them as directed.  Constipation:  It is fairly common to experience some difficulty in moving your bowels following major  surgery.  Being active will help to reduce this likelihood. A diet rich in fiber and plenty of liquids is desirable.  If you do become constipated, a mild laxative such as Miralax, Milk of Magnesia, or Metamucil, or a stool softener such as Colace, is recommended.  General Instructions: If you develop a fever of 100.5 degrees or higher, please call the office number(s) below for physician on call.   Post Anesthesia Home Care Instructions  Activity: Get plenty of rest for the remainder of the day. A responsible individual must stay with you for 24 hours following the procedure.  For the next 24 hours, DO NOT: -Drive a car -Advertising copywriter -Drink alcoholic beverages -Take any medication unless instructed by your physician -Make any legal decisions or sign important papers.  Meals: Start with liquid foods such as gelatin or soup. Progress to regular foods as tolerated. Avoid greasy, spicy, heavy foods. If nausea and/or vomiting occur, drink only clear liquids until the nausea and/or vomiting subsides. Call your physician if vomiting continues.  Special Instructions/Symptoms: Your throat may feel dry or sore from the anesthesia or the breathing tube placed in your throat during surgery. If this causes discomfort, gargle with warm salt water. The discomfort should disappear within 24 hours.  If you had a scopolamine patch placed behind your ear for the management of post- operative nausea and/or vomiting:  1. The medication in the patch is effective for 72 hours, after which it should be removed.  Wrap patch in a tissue and discard in the trash. Wash hands thoroughly with soap and water. 2. You may remove the patch earlier than 72 hours if you experience unpleasant side effects which may include dry mouth, dizziness or visual disturbances. 3. Avoid touching the patch. Wash your hands with soap and water after contact with the patch.

## 2018-08-02 NOTE — Transfer of Care (Signed)
Immediate Anesthesia Transfer of Care Note  Patient: Taylor Bautista  Procedure(s) Performed: LAPAROSCOPIC TUBAL LIGATION - Filshie Clips (Bilateral Abdomen)  Patient Location: PACU  Anesthesia Type:General  Level of Consciousness: awake, alert , oriented and drowsy  Airway & Oxygen Therapy: Patient Spontanous Breathing and Patient connected to face mask oxygen  Post-op Assessment: Report given to RN and Post -op Vital signs reviewed and stable  Post vital signs: Reviewed and stable  Last Vitals:  Vitals Value Taken Time  BP    Temp    Pulse 100 08/02/2018  9:33 AM  Resp 17 08/02/2018  9:33 AM  SpO2 100 % 08/02/2018  9:33 AM  Vitals shown include unvalidated device data.  Last Pain:  Vitals:   08/02/18 0743  TempSrc: Oral  PainSc: 0-No pain         Complications: No apparent anesthesia complications

## 2018-08-02 NOTE — Op Note (Addendum)
Operative Note   08/02/2018  PRE-OP DIAGNOSIS: Desire for permanent sterilization   POST-OP DIAGNOSIS: Same   SURGEON: Surgeon(s) and Role:    * Harmony BingPickens, Edra Riccardi, MD - Primary  ASSISTANT: None  ANESTHESIA: General and local  PROCEDURE: Procedure(s): LAPAROSCOPIC TUBAL LIGATION - Filshie Clips   ESTIMATED BLOOD LOSS: 5mL  DRAINS: I/O cath for 10mL UOP at beginning of case   TOTAL IV FLUIDS: per anesthesia note  SPECIMENS: none   VTE PROPHYLAXIS: SCDs to the bilateral lower extremities  ANTIBIOTICS: not indicated  COMPLICATIONS: none  DISPOSITION: PACU - hemodynamically stable.  CONDITION: stable  FINDINGS: Exam under anesthesia revealed an anteverted uterus approximately 6-8 week size, normal shape, and no adnexal masses. Patient sounded to 9cm. Laparoscopic survey of the abdomen revealed a grossly normal uterus, tubes, ovaries, liver, and stomach edge; no intra-abdominal adhesions were noted  PROCEDURE IN DETAIL: The patient was taken to the OR where anesthesia was administed. The patient was positioned in dorsal lithotomy in the [place] stirrups. The patient was then examined under anesthesia with the above noted findings. The patient was prepped and draped in the normal sterile fashion and foley catheter was placed. A Graves speculum was placed in the vagina and the anterior lip of the cervix was grasped with a single toothed tenaculum.  A Hulka uterine manipulator was then inserted in the uterus and uterine mobility was found to be satisfactory; the speculum and tenaculum were then removed.  After changing gloves, attention was turned to the patient's abdomen where a 10 mm skin incision was made in the umbilical fold, after injection of local anesthesia. The Hasson method was used to enter the abdomen and the 10mm port was then placed  the operative laparoscope was introduced into the abdomen with the above noted findings, after inspection of the entry site and then placing  the patient in Trendelenburg and obtaining pneumoperitoneum Next a blunt probe was inserted, via the operative laparoscope, and the bilateral tubes were traced out to their respective fimbriae, with the above noted findings. Next, a Filshie clip was loaded and the placed across the tube approximately one third of the way down the left tube from the uterus. The placement was inspected and complete occlusion of the tube, with the bottom jaw on the outside of the mesosalpinx noted. This was then done on the right tube.  All instruments and ports were then removed from the abdomen. The fascia at the umbilical incision was reapproximated with 0 vicryl. The skin was closed with 4-0 monocryl and dermabond. The Hulka was removed with no bleeding noted from the cervix and all other instrumentation was removed from the vagina.  The foley catheter was removed. The patient tolerated the procedure well. All counts were correct x 2. The patient was transferred to the recovery room awake, alert and breathing independently.   Cornelia Copaharlie Irineo Gaulin, Jr MD Attending [facility] for [facility](Faculty Practice)

## 2018-08-02 NOTE — Anesthesia Preprocedure Evaluation (Addendum)
Anesthesia Evaluation  Patient identified by MRN, date of birth, ID band Patient awake    Reviewed: Allergy & Precautions, NPO status , Patient's Chart, lab work & pertinent test results  Airway Mallampati: II  TM Distance: >3 FB Neck ROM: Full    Dental no notable dental hx.  Stud to right nares:   Pulmonary sleep apnea , Current Smoker,    Pulmonary exam normal breath sounds clear to auscultation       Cardiovascular negative cardio ROS Normal cardiovascular exam Rhythm:Regular Rate:Normal  ECG: SR, rate 72   Neuro/Psych  Headaches, PSYCHIATRIC DISORDERS Anxiety Depression    GI/Hepatic negative GI ROS, Neg liver ROS,   Endo/Other  negative endocrine ROS  Renal/GU negative Renal ROS     Musculoskeletal negative musculoskeletal ROS (+)   Abdominal   Peds  Hematology negative hematology ROS (+)   Anesthesia Other Findings Undesired Fertility  Reproductive/Obstetrics hcg negative                            Anesthesia Physical Anesthesia Plan  ASA: II  Anesthesia Plan: General   Post-op Pain Management:    Induction: Intravenous  PONV Risk Score and Plan: 3 and Midazolam, Dexamethasone, Ondansetron and Treatment may vary due to age or medical condition  Airway Management Planned: Oral ETT  Additional Equipment:   Intra-op Plan:   Post-operative Plan: Extubation in OR  Informed Consent: I have reviewed the patients History and Physical, chart, labs and discussed the procedure including the risks, benefits and alternatives for the proposed anesthesia with the patient or authorized representative who has indicated his/her understanding and acceptance.   Dental advisory given  Plan Discussed with: CRNA  Anesthesia Plan Comments:        Anesthesia Quick Evaluation

## 2018-08-02 NOTE — Anesthesia Procedure Notes (Signed)
Procedure Name: Intubation Date/Time: 08/02/2018 8:37 AM Performed by: Willa Frater, CRNA Pre-anesthesia Checklist: Patient identified, Emergency Drugs available, Suction available and Patient being monitored Patient Re-evaluated:Patient Re-evaluated prior to induction Oxygen Delivery Method: Circle system utilized Preoxygenation: Pre-oxygenation with 100% oxygen Induction Type: IV induction Ventilation: Mask ventilation without difficulty Laryngoscope Size: Mac and 3 Grade View: Grade I Tube type: Oral Tube size: 7.0 mm Number of attempts: 1 Airway Equipment and Method: Stylet and Oral airway Placement Confirmation: ETT inserted through vocal cords under direct vision,  positive ETCO2 and breath sounds checked- equal and bilateral Secured at: 21 cm Tube secured with: Tape Dental Injury: Teeth and Oropharynx as per pre-operative assessment

## 2018-08-02 NOTE — Anesthesia Postprocedure Evaluation (Signed)
Anesthesia Post Note  Patient: Taylor Bautista  Procedure(s) Performed: LAPAROSCOPIC TUBAL LIGATION - Filshie Clips (Bilateral Abdomen)     Patient location during evaluation: PACU Anesthesia Type: General Level of consciousness: awake and alert Pain management: pain level controlled Vital Signs Assessment: post-procedure vital signs reviewed and stable Respiratory status: spontaneous breathing, nonlabored ventilation, respiratory function stable and patient connected to nasal cannula oxygen Cardiovascular status: blood pressure returned to baseline and stable Postop Assessment: no apparent nausea or vomiting Anesthetic complications: no    Last Vitals:  Vitals:   08/02/18 0945 08/02/18 1007  BP: (!) 98/53 103/76  Pulse: 88 78  Resp: 11   Temp:  36.5 C  SpO2: 100% 100%    Last Pain:  Vitals:   08/02/18 1007  TempSrc: Oral  PainSc: 10-Worst pain ever                 Chealsey Miyamoto P Sylvia Helms

## 2018-08-03 ENCOUNTER — Telehealth: Payer: Self-pay | Admitting: Obstetrics and Gynecology

## 2018-08-03 ENCOUNTER — Other Ambulatory Visit: Payer: Self-pay

## 2018-08-03 MED ORDER — ONDANSETRON HCL 4 MG PO TABS: 4.0000 mg | ORAL_TABLET | Freq: Three times a day (TID) | ORAL | 0 refills | Status: DC | PRN

## 2018-08-03 NOTE — Telephone Encounter (Signed)
Called patient regarding medications that was prescribed yesterday after having surgery. OK by Dr.Pickens to called in Zofran 4mg  prn every 8 hours for patient to take with Oxycodone since the medication is making her sick. I have advised her to eat heavy meal when taking the Oxycodone. She will call us back if this does not help with vomiting after taking her pill. Advised patient that if she is would like a different pain medications she would have to bring the Oxycodone back to the office since there are strong regulations regarding narcotics. Patient voice understanding at this time.

## 2018-08-03 NOTE — Telephone Encounter (Signed)
Patient called office earlier and spoke with CMA whom consulted with Dr. Vergie LivingPickens.  Patient is requesting alternate narcotic, due to vomiting with oxycodone.  Zofran was sent to the pharmacy and alternate narcotic was offered in exchange for the oxycodone.  Patient called back to office very upset wanting to speak with office manager.  Patient stated that she was in severe pain.  Patient was again reassured that she could come into the office for an alternate pain medication, but she declines and states she is planning to now change doctors because she does not want to return her oxycodone.

## 2018-08-04 ENCOUNTER — Encounter (HOSPITAL_BASED_OUTPATIENT_CLINIC_OR_DEPARTMENT_OTHER): Payer: Self-pay | Admitting: Obstetrics and Gynecology

## 2018-08-21 ENCOUNTER — Encounter: Payer: Self-pay | Admitting: Obstetrics and Gynecology

## 2018-08-21 ENCOUNTER — Encounter: Payer: 59 | Admitting: Obstetrics and Gynecology

## 2018-08-22 NOTE — Progress Notes (Signed)
Patient did not keep her post op appointment for 08/21/2018.  Cornelia Copaharlie Lacy Sofia, Jr MD Attending [facility]ulty [facility])

## 2018-09-14 ENCOUNTER — Telehealth: Payer: Self-pay
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
## 2018-09-15 ENCOUNTER — Ambulatory Visit (INDEPENDENT_AMBULATORY_CARE_PROVIDER_SITE_OTHER): Payer: 59 | Admitting: Internal Medicine

## 2018-09-15 ENCOUNTER — Encounter: Payer: Self-pay | Admitting: Internal Medicine

## 2018-09-15 VITALS — BP 116/72 | HR 103 | Temp 98.3°F | Wt 154.0 lb

## 2018-09-15 DIAGNOSIS — T7491XA Unspecified adult maltreatment, confirmed, initial encounter: Secondary | ICD-10-CM | POA: Diagnosis not present

## 2018-09-15 DIAGNOSIS — S20211A Contusion of right front wall of thorax, initial encounter: Secondary | ICD-10-CM | POA: Diagnosis not present

## 2018-09-15 NOTE — Telephone Encounter (Signed)
Will see her then 
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
## 2018-09-15 NOTE — Patient Instructions (Signed)
Rib Contusion  A rib contusion is a deep bruise on your rib area. Contusions are the result of a blunt trauma that causes bleeding and injury to the tissues under the skin. A rib contusion may involve bruising of the ribs and of the skin and muscles in the area. The skin over the contusion may turn blue, purple, or yellow. Minor injuries will give you a painless contusion. More severe contusions may stay painful and swollen for a few weeks.  What are the causes?  This condition is usually caused by a blow, trauma, or direct force to an area of the body. This often occurs while playing contact sports.  What are the signs or symptoms?  Symptoms of this condition include:   Swelling and redness of the injured area.   Discoloration of the injured area.   Tenderness and soreness of the injured area.   Pain with or without movement.  How is this diagnosed?  This condition may be diagnosed based on:   Your symptoms and medical history.   A physical exam.   Imaging tests-such as an X-ray, CT scan, or MRI-to determine if there were internal injuries or broken bones (fractures).  How is this treated?  This condition may be treated with:   Rest. This is often the best treatment for a rib contusion.   Icing. This reduces swelling and inflammation.   Deep-breathing exercises. These may be recommended to reduce the risk for lung collapse and pneumonia.   Medicines. Over-the-counter or prescription medicines may be given to control pain.   Injection of a numbing medicine around the nerve near your injury (nerve block).  Follow these instructions at home:         Medicines   Take over-the-counter and prescription medicines only as told by your health care provider.   Do not drive or use heavy machinery while taking prescription pain medicine.   If you are taking prescription pain medicine, take actions to prevent or treat constipation. Your health care provider may recommend that you:  ? Drink enough fluid to keep  your urine pale yellow.  ? Eat foods that are high in fiber, such as fresh fruits and vegetables, whole grains, and beans.  ? Limit foods that are high in fat and processed sugars, such as fried or sweet foods.  ? Take an over-the-counter or prescription medicine for constipation.  Managing pain, stiffness, and swelling   If directed, put ice on the injured area:  ? Put ice in a plastic bag.  ? Place a towel between your skin and the bag.  ? Leave the ice on for 20 minutes, 2-3 times a day.   Rest the injured area. Avoid strenuous activity and any activities or movements that cause pain. Be careful during activities and avoid bumping the injured area.   Do not lift anything that is heavier than 5 lb (2.3 kg), or the limit that you are told, until your health care provider says that it is safe.  General instructions   Do not use any products that contain nicotine or tobacco, such as cigarettes and e-cigarettes. These can delay healing. If you need help quitting, ask your health care provider.   Do deep-breathing exercises as told by your health care provider.   If you were given an incentive spirometer, use it every 1-2 hours while you are awake, or as recommended by your health care provider. This device measures how well you are filling your lungs with each breath.     Keep all follow-up visits as told by your health care provider. This is important.  Contact a health care provider if you have:   Increased bruising or swelling.   Pain that is not controlled with treatment.   A fever.  Get help right away if you:   Have difficulty breathing or shortness of breath.   Develop a continual cough or you cough up thick or bloody sputum.   Feel nauseous or you vomit.   Have pain in your abdomen.  Summary   A rib contusion is a deep bruise on your rib area. Contusions are the result of a blunt trauma that causes bleeding and injury to the tissues under the skin.   The skin overlying the contusion may turn  blue, purple, or yellow. Minor injuries may give you a painless contusion. More severe contusions may stay painful and swollen for a few weeks.   Rest the injured area. Avoid strenuous activity and any activities or movements that cause pain.  This information is not intended to replace advice given to you by your health care provider. Make sure you discuss any questions you have with your health care provider.  Document Released: 05/25/2001 Document Revised: 09/28/2017 Document Reviewed: 09/28/2017  Elsevier Interactive Patient Education  2019 [employer]

## 2018-10-31 ENCOUNTER — Telehealth: Payer: Self-pay | Admitting: *Deleted

## 2018-10-31 ENCOUNTER — Other Ambulatory Visit: Payer: Self-pay | Admitting: *Deleted

## 2018-10-31 MED ORDER — FLUCONAZOLE 150 MG PO TABS: 150.0000 mg | ORAL_TABLET | Freq: Once | ORAL | 0 refills | Status: AC

## 2018-10-31 NOTE — Telephone Encounter (Signed)
-----   Message from Rozann Lesches, NT sent at 10/31/2018  4:07 PM EST ----- Regarding: rx for yeast Patient is requesting a rx be sent to [facility] in [place] for yeast

## 2018-11-02 ENCOUNTER — Encounter: Payer: Self-pay | Admitting: Internal Medicine

## 2018-11-02 ENCOUNTER — Ambulatory Visit (INDEPENDENT_AMBULATORY_CARE_PROVIDER_SITE_OTHER): Payer: 59 | Admitting: Internal Medicine

## 2018-11-02 VITALS — BP 114/68 | HR 78 | Temp 98.0°F | Wt 152.0 lb

## 2018-11-02 DIAGNOSIS — N898 Other specified noninflammatory disorders of vagina: Secondary | ICD-10-CM | POA: Diagnosis not present

## 2018-11-02 MED ORDER — METRONIDAZOLE 500 MG PO TABS: 500.0000 mg | ORAL_TABLET | Freq: Two times a day (BID) | ORAL | 0 refills | Status: DC

## 2018-11-02 NOTE — Addendum Note (Signed)
Addended by: Roena Malady on: 11/02/2018 02:54 PM   Modules accepted: Orders

## 2018-11-02 NOTE — Patient Instructions (Signed)
Vaginitis    Vaginitis is irritation and swelling (inflammation) of the vagina. It happens when normal bacteria and yeast in the vagina grow too much. There are many types of this condition. Treatment will depend on the type you have.  Follow these instructions at home:  Lifestyle  · Keep your vagina area clean and dry.  ? Avoid using soap.  ? Rinse the area with water.  · Do not do the following until your doctor says it is okay:  ? Wash and clean out the vagina (douche).  ? Use tampons.  ? Have sex.  · Wipe from front to back after going to the bathroom.  · Let air reach your vagina.  ? Wear cotton underwear.  ? Do not wear:  ? Underwear while you sleep.  ? Tight pants.  ? Thong underwear.  ? Underwear or nylons without a cotton panel.  ? Take off any wet clothing, such as bathing suits, as soon as possible.  · Use gentle, non-scented products. Do not use things that can irritate the vagina, such as fabric softeners. Avoid the following products if they are scented:  ? Feminine sprays.  ? Detergents.  ? Tampons.  ? Feminine hygiene products.  ? Soaps or bubble baths.  · Practice safe sex and use condoms.  General instructions  · Take over-the-counter and prescription medicines only as told by your doctor.  · If you were prescribed an antibiotic medicine, take or use it as told by your doctor. Do not stop taking or using the antibiotic even if you start to feel better.  · Keep all follow-up visits as told by your doctor. This is important.  Contact a doctor if:  · You have pain in your belly.  · You have a fever.  · Your symptoms last for more than 2-3 days.  Get help right away if:  · You have a fever and your symptoms get worse all of a sudden.  Summary  · Vaginitis is irritation and swelling of the vagina. It can happen when the normal bacteria and yeast in the vagina grow too much. There are many types.  · Treatment will depend on the type you have.  · Do not douche, use tampons , or have sex until your health  care provider approves. When you can return to sex, practice safe sex and use condoms.  This information is not intended to replace advice given to you by your health care provider. Make sure you discuss any questions you have with your health care provider.  Document Released: 11/26/2008 Document Revised: 09/21/2016 Document Reviewed: 09/21/2016  Elsevier Interactive Patient Education © 2019 [employer]

## 2018-11-02 NOTE — Progress Notes (Signed)
Subjective:    Patient ID: Taylor Bautista, female    DOB: 1991-01-28, 28 y.o.   MRN: [id number]  HPI  Pt presents to the clinic today with c/o vaginal discharge and itching. She reports this started 3 days ago. She reports an associated odor. She denies vaginal irritation, dyspareunia or abnormal bleeding. She denies urinary symptoms. She sent a message to her GYN about the same. They sent her in Diflucan 150 mg PO x 1. She recently got separated from her husband and has a new partner. She would like STD screening.  Review of Systems      Past Medical History:  Diagnosis Date  . Allergy   . Anxiety    no meds  . Dysmenorrhea   . Dysthymic disorder   . Exposure to STD   . MRSA infection   . Sleep apnea    pt states she does not use her CPAP   . Tobacco use disorder     Current Outpatient Medications  Medication Sig Dispense Refill  . cyclobenzaprine (FLEXERIL) 5 MG tablet Take 1 tablet (5 mg total) by mouth at bedtime. 15 tablet 0  . ibuprofen (ADVIL,MOTRIN) 200 MG tablet Take 200 mg by mouth every 6 (six) hours as needed.    . vitamin B-12 (CYANOCOBALAMIN) 500 MCG tablet Take 500 mcg by mouth daily.     No current facility-administered medications for this visit.     No Known Allergies  Family History  Problem Relation Age of Onset  . Cancer Mother 8       one breast  . Cancer Cousin 24       breast  . Anxiety disorder Other   . Cancer Sister 58       lymph node cancer twice  . Anxiety disorder Sister   . Depression Sister     Social History   Socioeconomic History  . Marital status: Divorced    Spouse name: Not on file  . Number of children: Not on file  . Years of education: Not on file  . Highest education level: Not on file  Occupational History  . Not on file  Social Needs  . Financial resource strain: Not on file  . Food insecurity:    Worry: Not on file    Inability: Not on file  . Transportation needs:    Medical: Not on file   Non-medical: Not on file  Tobacco Use  . Smoking status: Current Every Day Smoker    Packs/day: 0.25    Types: Cigarettes  . Smokeless tobacco: Never Used  Substance and Sexual Activity  . Alcohol use: No    Alcohol/week: 1.0 standard drinks    Types: 1 Cans of beer per week  . 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 Sexual activity: Not Currently    Partners: Male    Birth control/protection: I.U.D.  Lifestyle  . Physical activity:    Days per week: Not on file    Minutes per session: Not on file  . Stress: Not on file  Relationships  . Social connections:    Talks on phone: Not on file    Gets together: Not on file    Attends religious service: Not on file    Active member of club or organization: Not on file    Attends meetings of clubs or organizations: Not on file    Relationship status: Not on file  .  Intimate partner violence:    Fear of current or ex partner: Not on file    Emotionally abused: Not on file    Physically abused: Not on file    Forced sexual activity: Not on file  Other Topics Concern  . Not on file  Social History Narrative   Lives with mother      Works at [employer]           Constitutional: Denies fever, malaise, fatigue, headache or abrupt weight changes.  Respiratory: Denies difficulty breathing, shortness of breath, cough or sputum production.   Cardiovascular: Denies chest pain, chest tightness, palpitations or swelling in the hands or feet.  Gastrointestinal: Denies abdominal pain, bloating, constipation, diarrhea or blood in the stool.  GU: Pt reports vaginal itching. Denies urgency, frequency, pain with urination, burning sensation, blood in urine, odor or discharge.  No other specific complaints in a complete review of systems (except as listed in HPI above).  Objective:   Physical Exam  BP 114/68   Pulse 78   Temp 98 F (36.7 C) (Oral)   Wt 152 lb (68.9 kg)   LMP  10/17/2018   SpO2 98%   BMI 24.53 kg/m  Wt Readings from Last 3 Encounters:  11/02/18 152 lb (68.9 kg)  09/15/18 154 lb (69.9 kg)  08/02/18 161 lb 9.6 oz (73.3 kg)    General: Appears her stated age, well developed, well nourished in NAD. Cardiovascular: Normal rate and rhythm. S1,S2 noted.  No murmur, rubs or gallops noted.  Pulmonary/Chest: Normal effort and positive vesicular breath sounds. No respiratory distress. No wheezes, rales or ronchi noted.  Abdomen: Soft and nontender. Normal bowel sounds. No distention or masses noted.  Pelvic: Self swabbed. Neurological: Alert and oriented.   BMET    Component Value Date/Time   NA 140 02/01/2018 1453   K 3.7 02/01/2018 1453   CL 106 02/01/2018 1453   CO2 28 02/01/2018 1453   GLUCOSE 73 02/01/2018 1453   BUN 9 02/01/2018 1453   CREATININE 0.72 02/01/2018 1453   CALCIUM 9.4 02/01/2018 1453   GFRNONAA >60 04/28/2015 2305   GFRAA >60 04/28/2015 2305    Lipid Panel     Component Value Date/Time   CHOL 162 02/01/2018 1453   TRIG 124.0 02/01/2018 1453   HDL 48.90 02/01/2018 1453   CHOLHDL 3 02/01/2018 1453   VLDL 24.8 02/01/2018 1453   LDLCALC 88 02/01/2018 1453    CBC    Component Value Date/Time   WBC 5.9 02/01/2018 1453   RBC 4.41 02/01/2018 1453   HGB 13.9 02/01/2018 1453   HCT 40.1 02/01/2018 1453   PLT 259.0 02/01/2018 1453   MCV 91.0 02/01/2018 1453   MCH 31.0 04/28/2015 2305   MCHC 34.6 02/01/2018 1453   RDW 12.6 02/01/2018 1453   LYMPHSABS 1.6 09/23/2017 1259   MONOABS 0.5 09/23/2017 1259   EOSABS 0.3 09/23/2017 1259   BASOSABS 0.0 09/23/2017 1259    Hgb A1C No results found for: HGBA1C          Assessment & Plan:   Vaginal Itching, Odor:  Will send off wet prep Urine gonorrhea and chlamydia No improvement with Diflucan ? BV, RX for Metrogel 500 mg BID x 7 days- advised no alcohol  Will follow up after labs, return precautions discussed Nicki Reaper, NP

## 2018-11-03 ENCOUNTER — Telehealth: Payer: Self-pay | Admitting: Internal Medicine

## 2018-11-03 LAB — WET PREP BY MOLECULAR PROBE
Candida species: NOT DETECTED
MICRO NUMBER: 221336
SPECIMEN QUALITY:: ADEQUATE
TRICHOMONAS VAG: NOT DETECTED

## 2018-11-03 LAB — C. TRACHOMATIS/N. GONORRHOEAE RNA
C. TRACHOMATIS RNA, TMA: NOT DETECTED
N. gonorrhoeae RNA, TMA: NOT DETECTED

## 2018-11-03 LAB — TRICHOMONAS VAGINALIS, PROBE AMP: Trichomonas vaginalis RNA: NOT DETECTED

## 2018-11-03 NOTE — Telephone Encounter (Signed)
Pt called requesting results for her labs she had done yesterday

## 2018-12-04 ENCOUNTER — Other Ambulatory Visit: Payer: Self-pay

## 2018-12-04 ENCOUNTER — Encounter: Payer: Self-pay | Admitting: Internal Medicine

## 2018-12-04 ENCOUNTER — Ambulatory Visit (INDEPENDENT_AMBULATORY_CARE_PROVIDER_SITE_OTHER): Payer: 59 | Admitting: Internal Medicine

## 2018-12-04 VITALS — BP 106/68 | HR 73 | Temp 98.3°F | Wt 148.0 lb

## 2018-12-04 DIAGNOSIS — N898 Other specified noninflammatory disorders of vagina: Secondary | ICD-10-CM | POA: Diagnosis not present

## 2018-12-04 NOTE — Progress Notes (Signed)
Subjective:    Patient ID: Taylor Bautista, female    DOB: 06-May-1991, 28 y.o.   MRN: [id number]  HPI  Pt presents to the clinic today with c/o vaginal burning and irritation. This has been an ongoing issue over the last month. She reports thin, white vaginal discharge. She denies colored discharge, pelvic pain, abnormal bleeding or urinary symptoms. She denies fever, chills, nausea or low back pain. She has not taken anything OTC. She was recently treated for BV, and reports symptoms improved while taking medications. She is sexually active.  Review of Systems  Past Medical History:  Diagnosis Date  . Allergy   . Anxiety    no meds  . Dysmenorrhea   . Dysthymic disorder   . Exposure to STD   . MRSA infection   . Sleep apnea    pt states she does not use her CPAP   . Tobacco use disorder     Current Outpatient Medications  Medication Sig Dispense Refill  . cyclobenzaprine (FLEXERIL) 5 MG tablet Take 1 tablet (5 mg total) by mouth at bedtime. 15 tablet 0  . ibuprofen (ADVIL,MOTRIN) 200 MG tablet Take 200 mg by mouth every 6 (six) hours as needed.    . vitamin B-12 (CYANOCOBALAMIN) 500 MCG tablet Take 500 mcg by mouth daily.     No current facility-administered medications for this visit.     No Known Allergies  Family History  Problem Relation Age of Onset  . Cancer Mother 43       one breast  . Cancer Cousin 24       breast  . Anxiety disorder Other   . Cancer Sister 49       lymph node cancer twice  . Anxiety disorder Sister   . Depression Sister     Social History   Socioeconomic History  . Marital status: Divorced    Spouse name: Not on file  . Number of children: Not on file  . Years of education: Not on file  . Highest education level: Not on file  Occupational History  . Not on file  Social Needs  . Financial resource strain: Not on file  . Food insecurity:    Worry: Not on file    Inability: Not on file  . Transportation needs:    Medical: Not  on file    Non-medical: Not on file  Tobacco Use  . Smoking status: Current Every Day Smoker    Packs/day: 0.25    Types: Cigarettes  . Smokeless tobacco: Never Used  Substance and Sexual Activity  . Alcohol use: No    Alcohol/week: 1.0 standard drinks    Types: 1 Cans of beer per week  . 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 Sexual activity: Not Currently    Partners: Male    Birth control/protection: I.U.D.  Lifestyle  . Physical activity:    Days per week: Not on file    Minutes per session: Not on file  . Stress: Not on file  Relationships  . Social connections:    Talks on phone: Not on file    Gets together: Not on file    Attends religious service: Not on file    Active member of club or organization: Not on file    Attends meetings of clubs or organizations: Not on file    Relationship status: Not on file  . Intimate partner  violence:    Fear of current or ex partner: Not on file    Emotionally abused: Not on file    Physically abused: Not on file    Forced sexual activity: Not on file  Other Topics Concern  . Not on file  Social History Narrative   Lives with mother      Works at [employer]           Constitutional: Denies fever, malaise, fatigue, headache or abrupt weight changes.  Respiratory: Denies difficulty breathing, shortness of breath, cough or sputum production.   Cardiovascular: Denies chest pain, chest tightness, palpitations or swelling in the hands or feet.  Gastrointestinal: Denies abdominal pain, bloating, constipation, diarrhea or blood in the stool.  GU: Pt reports vaginal irritation, burning and discharge. Denies urgency, frequency, pain with urination, burning sensation, blood in urine, odor.  No other specific complaints in a complete review of systems (except as listed in HPI above).     Objective:   Physical Exam   BP 106/68   Pulse 73   Temp 98.3 F (36.8 C)  (Oral)   Wt 148 lb (67.1 kg)   SpO2 99%   BMI 23.89 kg/m  Wt Readings from Last 3 Encounters:  12/04/18 148 lb (67.1 kg)  11/02/18 152 lb (68.9 kg)  09/15/18 154 lb (69.9 kg)    General: Appears her stated age, well developed, well nourished in NAD. Cardiovascular: Normal rate and rhythm.  Pulmonary/Chest: Normal effort and positive vesicular breath sounds. No respiratory distress. No wheezes, rales or ronchi noted.  Abdomen: Soft and nontender. Normal bowel sounds. No distention or masses noted.  Pelvic: Self swabbed.  BMET    Component Value Date/Time   NA 140 02/01/2018 1453   K 3.7 02/01/2018 1453   CL 106 02/01/2018 1453   CO2 28 02/01/2018 1453   GLUCOSE 73 02/01/2018 1453   BUN 9 02/01/2018 1453   CREATININE 0.72 02/01/2018 1453   CALCIUM 9.4 02/01/2018 1453   GFRNONAA >60 04/28/2015 2305   GFRAA >60 04/28/2015 2305    Lipid Panel     Component Value Date/Time   CHOL 162 02/01/2018 1453   TRIG 124.0 02/01/2018 1453   HDL 48.90 02/01/2018 1453   CHOLHDL 3 02/01/2018 1453   VLDL 24.8 02/01/2018 1453   LDLCALC 88 02/01/2018 1453    CBC    Component Value Date/Time   WBC 5.9 02/01/2018 1453   RBC 4.41 02/01/2018 1453   HGB 13.9 02/01/2018 1453   HCT 40.1 02/01/2018 1453   PLT 259.0 02/01/2018 1453   MCV 91.0 02/01/2018 1453   MCH 31.0 04/28/2015 2305   MCHC 34.6 02/01/2018 1453   RDW 12.6 02/01/2018 1453   LYMPHSABS 1.6 09/23/2017 1259   MONOABS 0.5 09/23/2017 1259   EOSABS 0.3 09/23/2017 1259   BASOSABS 0.0 09/23/2017 1259    Hgb A1C No results found for: HGBA1C         Assessment & Plan:   Vaginal Discharge/Irritation:  Will repeat wet prep Advised against douching, feminine washes and sprays  Return precautions discussed Nicki Reaper, NP

## 2018-12-04 NOTE — Patient Instructions (Signed)
Vaginitis    Vaginitis is irritation and swelling (inflammation) of the vagina. It happens when normal bacteria and yeast in the vagina grow too much. There are many types of this condition. Treatment will depend on the type you have.  Follow these instructions at home:  Lifestyle  · Keep your vagina area clean and dry.  ? Avoid using soap.  ? Rinse the area with water.  · Do not do the following until your doctor says it is okay:  ? Wash and clean out the vagina (douche).  ? Use tampons.  ? Have sex.  · Wipe from front to back after going to the bathroom.  · Let air reach your vagina.  ? Wear cotton underwear.  ? Do not wear:  ? Underwear while you sleep.  ? Tight pants.  ? Thong underwear.  ? Underwear or nylons without a cotton panel.  ? Take off any wet clothing, such as bathing suits, as soon as possible.  · Use gentle, non-scented products. Do not use things that can irritate the vagina, such as fabric softeners. Avoid the following products if they are scented:  ? Feminine sprays.  ? Detergents.  ? Tampons.  ? Feminine hygiene products.  ? Soaps or bubble baths.  · Practice safe sex and use condoms.  General instructions  · Take over-the-counter and prescription medicines only as told by your doctor.  · If you were prescribed an antibiotic medicine, take or use it as told by your doctor. Do not stop taking or using the antibiotic even if you start to feel better.  · Keep all follow-up visits as told by your doctor. This is important.  Contact a doctor if:  · You have pain in your belly.  · You have a fever.  · Your symptoms last for more than 2-3 days.  Get help right away if:  · You have a fever and your symptoms get worse all of a sudden.  Summary  · Vaginitis is irritation and swelling of the vagina. It can happen when the normal bacteria and yeast in the vagina grow too much. There are many types.  · Treatment will depend on the type you have.  · Do not douche, use tampons , or have sex until your health  care provider approves. When you can return to sex, practice safe sex and use condoms.  This information is not intended to replace advice given to you by your health care provider. Make sure you discuss any questions you have with your health care provider.  Document Released: 11/26/2008 Document Revised: 09/21/2016 Document Reviewed: 09/21/2016  Elsevier Interactive Patient Education © 2019 [employer]

## 2018-12-04 NOTE — Addendum Note (Signed)
Addended by: Roena Malady on: 12/04/2018 03:29 PM   Modules accepted: Orders

## 2018-12-05 ENCOUNTER — Other Ambulatory Visit: Payer: Self-pay | Admitting: Internal Medicine

## 2018-12-05 ENCOUNTER — Telehealth: Payer: Self-pay | Admitting: Internal Medicine

## 2018-12-05 LAB — WET PREP BY MOLECULAR PROBE
Candida species: DETECTED — AB
GARDNERELLA VAGINALIS: NOT DETECTED
MICRO NUMBER:: 345542
SPECIMEN QUALITY: ADEQUATE
Trichomonas vaginosis: NOT DETECTED

## 2018-12-05 MED ORDER — FLUCONAZOLE 150 MG PO TABS: ORAL_TABLET | ORAL | 0 refills | Status: DC

## 2018-12-05 NOTE — Telephone Encounter (Signed)
PT is on the phone wanting to know her lab results b/c she has a medication that was called in and she don't know why. Pt is at the pharmacy. Please call pt

## 2018-12-12 ENCOUNTER — Encounter: Payer: Self-pay | Admitting: Internal Medicine

## 2018-12-12 ENCOUNTER — Ambulatory Visit (INDEPENDENT_AMBULATORY_CARE_PROVIDER_SITE_OTHER): Payer: 59 | Admitting: Internal Medicine

## 2018-12-12 ENCOUNTER — Other Ambulatory Visit: Payer: Self-pay

## 2018-12-12 VITALS — BP 108/72 | HR 84 | Temp 98.0°F | Wt 148.0 lb

## 2018-12-12 DIAGNOSIS — M79672 Pain in left foot: Secondary | ICD-10-CM | POA: Diagnosis not present

## 2018-12-12 DIAGNOSIS — M722 Plantar fascial fibromatosis: Secondary | ICD-10-CM

## 2018-12-12 MED ORDER — MELOXICAM 15 MG PO TABS: 15.0000 mg | ORAL_TABLET | Freq: Every day | ORAL | 0 refills | Status: DC

## 2018-12-12 NOTE — Patient Instructions (Signed)

## 2018-12-12 NOTE — Progress Notes (Signed)
Subjective:    Patient ID: Taylor Bautista, female    DOB: February 22, 1991, 28 y.o.   MRN: [id number]  HPI  Pt presents to the clinic today with c/o pain in the heel of her left foot. She reports this started 2 months ago. She describes the pain as sharp and stabbing. The pain mostly occurs with ambulation and putting direct pressure on the area. The pain can radiate into her arch. She denies any injury to the area. She has not noticed any bruising, swelling, numbness, tingling or weakness. She has tried Ibuprofen, ice, massage without any relief.  Review of Systems  Past Medical History:  Diagnosis Date  . Allergy   . Anxiety    no meds  . Dysmenorrhea   . Dysthymic disorder   . Exposure to STD   . MRSA infection   . Sleep apnea    pt states she does not use her CPAP   . Tobacco use disorder     Current Outpatient Medications  Medication Sig Dispense Refill  . cyclobenzaprine (FLEXERIL) 5 MG tablet Take 1 tablet (5 mg total) by mouth at bedtime. 15 tablet 0  . fluconazole (DIFLUCAN) 150 MG tablet 1 tab PO today, repeat 1 tab PO in 3 days 2 tablet 0  . ibuprofen (ADVIL,MOTRIN) 200 MG tablet Take 200 mg by mouth every 6 (six) hours as needed.    . vitamin B-12 (CYANOCOBALAMIN) 500 MCG tablet Take 500 mcg by mouth daily.     No current facility-administered medications for this visit.     No Known Allergies  Family History  Problem Relation Age of Onset  . Cancer Mother 51       one breast  . Cancer Cousin 24       breast  . Anxiety disorder Other   . Cancer Sister 37       lymph node cancer twice  . Anxiety disorder Sister   . Depression Sister     Social History   Socioeconomic History  . Marital status: Divorced    Spouse name: Not on file  . Number of children: Not on file  . Years of education: Not on file  . Highest education level: Not on file  Occupational History  . Not on file  Social Needs  . Financial resource strain: Not on file  . Food  insecurity:    Worry: Not on file    Inability: Not on file  . Transportation needs:    Medical: Not on file    Non-medical: Not on file  Tobacco Use  . Smoking status: Current Every Day Smoker    Packs/day: 0.25    Types: Cigarettes  . Smokeless tobacco: Never Used  Substance and Sexual Activity  . Alcohol use: No    Alcohol/week: 1.0 standard drinks    Types: 1 Cans of beer per week  . 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 Sexual activity: Not Currently    Partners: Male    Birth control/protection: I.U.D.  Lifestyle  . Physical activity:    Days per week: Not on file    Minutes per session: Not on file  . Stress: Not on file  Relationships  . Social connections:    Talks on phone: Not on file    Gets together: Not on file    Attends religious service: Not on file    Active member of club or  organization: Not on file    Attends meetings of clubs or organizations: Not on file    Relationship status: Not on file  . Intimate partner violence:    Fear of current or ex partner: Not on file    Emotionally abused: Not on file    Physically abused: Not on file    Forced sexual activity: Not on file  Other Topics Concern  . Not on file  Social History Narrative   Lives with mother      Works at [employer]           Constitutional: Denies fever, malaise, fatigue, headache or abrupt weight changes.  Respiratory: Denies difficulty breathing, shortness of breath, cough or sputum production.   Cardiovascular: Denies chest pain, chest tightness, palpitations or swelling in the hands or feet.  Musculoskeletal: Pt reports left heel pain. Denies decrease in range of motion, difficulty with gait, muscle pain or joint swelling.  Skin: Denies redness, rashes, lesions or ulcercations.   No other specific complaints in a complete review of systems (except as listed in HPI above).     Objective:   Physical Exam    BP 108/72   Pulse 84   Temp 98 F (36.7 C) (Oral)   Wt 148 lb (67.1 kg)   SpO2 98%   BMI 23.89 kg/m  Wt Readings from Last 3 Encounters:  12/12/18 148 lb (67.1 kg)  12/04/18 148 lb (67.1 kg)  11/02/18 152 lb (68.9 kg)    General: Appears her stated age, well developed, well nourished in NAD. Skin: Warm, dry and intact. No rashes, lesions or ulcerations noted. Cardiovascular: Normal rate and rhythm. Pedal pulses 2+ bilaterally. Pulmonary/Chest: Normal effort and positive vesicular breath sounds. No respiratory distress. No wheezes, rales or ronchi noted.  Musculoskeletal: Normal plantar flexion and dorsiflexion noted of the left foot. No pain with palpation of the left Achilles. Pain with palpation of the left heel and arch. Gait slow and steady. Neurological: Alert and oriented. Sensation intact to BLE.   BMET    Component Value Date/Time   NA 140 02/01/2018 1453   K 3.7 02/01/2018 1453   CL 106 02/01/2018 1453   CO2 28 02/01/2018 1453   GLUCOSE 73 02/01/2018 1453   BUN 9 02/01/2018 1453   CREATININE 0.72 02/01/2018 1453   CALCIUM 9.4 02/01/2018 1453   GFRNONAA >60 04/28/2015 2305   GFRAA >60 04/28/2015 2305    Lipid Panel     Component Value Date/Time   CHOL 162 02/01/2018 1453   TRIG 124.0 02/01/2018 1453   HDL 48.90 02/01/2018 1453   CHOLHDL 3 02/01/2018 1453   VLDL 24.8 02/01/2018 1453   LDLCALC 88 02/01/2018 1453    CBC    Component Value Date/Time   WBC 5.9 02/01/2018 1453   RBC 4.41 02/01/2018 1453   HGB 13.9 02/01/2018 1453   HCT 40.1 02/01/2018 1453   PLT 259.0 02/01/2018 1453   MCV 91.0 02/01/2018 1453   MCH 31.0 04/28/2015 2305   MCHC 34.6 02/01/2018 1453   RDW 12.6 02/01/2018 1453   LYMPHSABS 1.6 09/23/2017 1259   MONOABS 0.5 09/23/2017 1259   EOSABS 0.3 09/23/2017 1259   BASOSABS 0.0 09/23/2017 1259    Hgb A1C No results found for: HGBA1C         Assessment & Plan:   Left Heel Pain, Plantar Fasciitis:  She declines xray today  RX for Meloxicam 15 mg PO daily (avoid all other NSAID's OTC) Planter fasciitis  rehab exercises given Discussed use of arch supports, heel inserts and orthotics  Return precautions discussed Nicki Reaper, NP

## 2018-12-19 ENCOUNTER — Ambulatory Visit (INDEPENDENT_AMBULATORY_CARE_PROVIDER_SITE_OTHER): Payer: 59

## 2018-12-19 ENCOUNTER — Other Ambulatory Visit: Payer: Self-pay

## 2018-12-19 VITALS — BP 115/72 | HR 72

## 2018-12-19 DIAGNOSIS — B9689 Other specified bacterial agents as the cause of diseases classified elsewhere: Secondary | ICD-10-CM

## 2018-12-19 DIAGNOSIS — N390 Urinary tract infection, site not specified: Secondary | ICD-10-CM

## 2018-12-19 DIAGNOSIS — Z113 Encounter for screening for infections with a predominantly sexual mode of transmission: Secondary | ICD-10-CM | POA: Diagnosis not present

## 2018-12-19 DIAGNOSIS — N898 Other specified noninflammatory disorders of vagina: Secondary | ICD-10-CM | POA: Diagnosis not present

## 2018-12-19 DIAGNOSIS — N760 Acute vaginitis: Secondary | ICD-10-CM

## 2018-12-19 NOTE — Progress Notes (Signed)
SUBJECTIVE:  28 y.o. female complains of vaginal  discharge for a couple of days. Denies abnormal vaginal bleeding or significant pelvic pain or fever. Patient reports some dysuria for a couple of days. Denies history of known exposure to STD.  No LMP recorded. 12/18/2018  OBJECTIVE:  She appears well, afebrile. Urine dipstick:negative for blood at this time ASSESSMENT:  Vaginal Discharge:small amount  Vaginal Odor: small amount    PLAN:  GC, chlamydia, trichomonas, BVAG, CVAG probe sent to lab. Will send off urine to r/o UTI since patient is complaining of symptoms. Treatment: To be determined once lab results are received ROV prn if symptoms persist or worsen.

## 2018-12-20 ENCOUNTER — Other Ambulatory Visit: Payer: Self-pay | Admitting: Obstetrics & Gynecology

## 2018-12-20 ENCOUNTER — Telehealth: Payer: Self-pay | Admitting: *Deleted

## 2018-12-20 DIAGNOSIS — B9689 Other specified bacterial agents as the cause of diseases classified elsewhere: Secondary | ICD-10-CM

## 2018-12-20 DIAGNOSIS — N760 Acute vaginitis: Secondary | ICD-10-CM

## 2018-12-20 DIAGNOSIS — N898 Other specified noninflammatory disorders of vagina: Secondary | ICD-10-CM

## 2018-12-20 LAB — CERVICOVAGINAL ANCILLARY ONLY
Bacterial vaginitis: POSITIVE — AB
Candida vaginitis: NEGATIVE
Chlamydia: NEGATIVE
Neisseria Gonorrhea: NEGATIVE
Trichomonas: NEGATIVE

## 2018-12-20 MED ORDER — METRONIDAZOLE 500 MG PO TABS: 500.0000 mg | ORAL_TABLET | Freq: Two times a day (BID) | ORAL | 0 refills | Status: DC

## 2018-12-20 NOTE — Telephone Encounter (Signed)
Pt informed of results and RX sent in to pharmacy.

## 2018-12-20 NOTE — Telephone Encounter (Signed)
-----   Message from Taylor Newcomer, MD sent at 12/20/2018  2:54 PM EDT ----- Vaginal discharge test is abnormal and showed bacterial vaginitis. Metronidazole prescribed. Please inform patient of results and advise to pick up prescription.

## 2018-12-22 LAB — URINE CULTURE

## 2018-12-25 ENCOUNTER — Telehealth: Payer: Self-pay | Admitting: *Deleted

## 2018-12-25 MED ORDER — SULFAMETHOXAZOLE-TRIMETHOPRIM 800-160 MG PO TABS: 1.0000 | ORAL_TABLET | Freq: Two times a day (BID) | ORAL | 1 refills | Status: DC

## 2018-12-25 NOTE — Telephone Encounter (Signed)
Pt informed of urine culture results, meds send in to pharmacy .

## 2019-01-19 ENCOUNTER — Encounter: Payer: Self-pay | Admitting: Family Medicine

## 2019-01-19 ENCOUNTER — Ambulatory Visit (INDEPENDENT_AMBULATORY_CARE_PROVIDER_SITE_OTHER): Payer: 59 | Admitting: Family Medicine

## 2019-01-19 ENCOUNTER — Other Ambulatory Visit: Payer: Self-pay

## 2019-01-19 VITALS — BP 106/70 | HR 89 | Temp 98.1°F | Ht 66.0 in | Wt 146.3 lb

## 2019-01-19 DIAGNOSIS — Z113 Encounter for screening for infections with a predominantly sexual mode of transmission: Secondary | ICD-10-CM

## 2019-01-19 DIAGNOSIS — Z0000 Encounter for general adult medical examination without abnormal findings: Secondary | ICD-10-CM | POA: Insufficient documentation

## 2019-01-19 DIAGNOSIS — N898 Other specified noninflammatory disorders of vagina: Secondary | ICD-10-CM

## 2019-01-19 LAB — POCT WET PREP (WET MOUNT): Trichomonas Wet Prep HPF POC: ABSENT

## 2019-01-19 MED ORDER — FLUCONAZOLE 150 MG PO TABS: ORAL_TABLET | ORAL | 0 refills | Status: DC

## 2019-01-19 NOTE — Progress Notes (Signed)
Subjective:    Patient ID: Taylor Bautista, female    DOB: 03/11/1991, 28 y.o.   MRN: [id number]  HPI 28 yo pt of NP Baity here for vaginitis symptoms   Symptoms started 3 d ago  Had been on bactrim ds -finished a week ago= that is better   Prior to that had BV and tx with flagyl   Not on OC  Had tubes tied   Has itching  Some d/c - is white  No cramping  LMP was about 1 1/2 weeks ago   No otc   No worries about STDs  Monogamous - would not hurt to check for gc /chlam  Declines HIV or serum tests   Wet prep today pos for yeast : Results for orders placed or performed in visit on 01/19/19  C. trachomatis/N. gonorrhoeae RNA  Result Value Ref Range   C. trachomatis RNA, TMA NOT DETECTED NOT DETECT   N. gonorrhoeae RNA, TMA NOT DETECTED NOT DETECT  POCT Wet Prep [employer] [facility])  Result Value Ref Range   Source Wet Prep POC vaginal    WBC, Wet Prep HPF POC many    Bacteria Wet Prep HPF POC Many (A) Few   BACTERIA WET PREP MORPHOLOGY POC     Clue Cells Wet Prep HPF POC Few (A) None   Clue Cells Wet Prep Whiff POC     Yeast Wet Prep HPF POC Many (A) None   KOH Wet Prep POC Moderate (A) None   Trichomonas Wet Prep HPF POC Absent Absent     Patient Active Problem List   Diagnosis Date Noted  . Vaginal itching 01/19/2019  . Screen for STD (sexually transmitted disease) 01/19/2019  . Episodic paroxysmal anxiety disorder 12/26/2014  . Migraine without aura 12/05/2012  . Allergic rhinitis 03/20/2007   Past Medical History:  Diagnosis Date  . Allergy   . Anxiety    no meds  . Dysmenorrhea   . Dysthymic disorder   . Exposure to STD   . MRSA infection   . Sleep apnea    pt states she does not use her CPAP   . Tobacco use disorder    Past Surgical History:  Procedure Laterality Date  . FOOT SURGERY  2016  . LAPAROSCOPIC TUBAL LIGATION Bilateral 08/02/2018   Procedure: LAPAROSCOPIC TUBAL LIGATION - Filshie Clips;  Surgeon: Woodmere Bing, MD;  Location: [facility];  Service: Gynecology;  Laterality: Bilateral;  . Pigmented nevus removal  04/1989   Social History   Tobacco Use  . Smoking status: Former Smoker    Types: Cigarettes    Last attempt to quit: 09/27/2018    Years since quitting: 0.3  . Smokeless tobacco: Never Used  Substance Use Topics  . Alcohol use: No    Alcohol/week: 1.0 standard drinks    Types: 1 Cans of beer per week  . 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 Cancer Mother 79       one breast  . Cancer Cousin 24       breast  . Anxiety disorder Other   . Cancer Sister 4       lymph node cancer twice  . Anxiety disorder Sister   . Depression Sister    No Known Allergies Current Outpatient Medications on File Prior to Visit  Medication Sig Dispense Refill  .  ibuprofen (ADVIL,MOTRIN) 200 MG tablet Take 200 mg by mouth every 6 (six) hours as needed.    . vitamin B-12 (CYANOCOBALAMIN) 500 MCG tablet Take 500 mcg by mouth daily.     No current facility-administered medications on file prior to visit.      Review of Systems  Constitutional: Negative for activity change, appetite change, fatigue, fever and unexpected weight change.  HENT: Negative for congestion, ear pain, rhinorrhea, sinus pressure and sore throat.   Eyes: Negative for pain, redness and visual disturbance.  Respiratory: Negative for cough, shortness of breath and wheezing.   Cardiovascular: Negative for chest pain and palpitations.  Gastrointestinal: Negative for abdominal pain, blood in stool, constipation and diarrhea.  Endocrine: Negative for polydipsia and polyuria.  Genitourinary: Positive for vaginal discharge. Negative for difficulty urinating, dyspareunia, dysuria, flank pain, frequency, genital sores, hematuria, pelvic pain, urgency, vaginal bleeding and vaginal pain.  Musculoskeletal: Negative for arthralgias, back pain and  myalgias.  Skin: Negative for pallor and rash.  Allergic/Immunologic: Negative for environmental allergies.  Neurological: Negative for dizziness, syncope and headaches.  Hematological: Negative for adenopathy. Does not bruise/bleed easily.  Psychiatric/Behavioral: Negative for decreased concentration and dysphoric mood. The patient is not nervous/anxious.        Objective:   Physical Exam Constitutional:      General: She is not in acute distress.    Appearance: Normal appearance. She is normal weight. She is not ill-appearing.  HENT:     Head: Normocephalic and atraumatic.     Mouth/Throat:     Mouth: Mucous membranes are moist.     Pharynx: Oropharynx is clear. No oropharyngeal exudate or posterior oropharyngeal erythema.  Eyes:     General:        Right eye: No discharge.        Left eye: No discharge.     Extraocular Movements: Extraocular movements intact.     Conjunctiva/sclera: Conjunctivae normal.     Pupils: Pupils are equal, round, and reactive to light.  Neck:     Musculoskeletal: Normal range of motion.  Cardiovascular:     Rate and Rhythm: Regular rhythm.     Heart sounds: Normal heart sounds.  Pulmonary:     Effort: Pulmonary effort is normal. No respiratory distress.     Breath sounds: Normal breath sounds. No wheezing or rales.  Abdominal:     General: Abdomen is flat. Bowel sounds are normal. There is no distension.     Palpations: Abdomen is soft.     Tenderness: There is no abdominal tenderness. There is no right CVA tenderness or left CVA tenderness.     Comments: No suprapubic tenderness or fullness    Genitourinary:    Vagina: Vaginal discharge present.     Comments: Mild hyperemia of labia minora  No ulceration or lesions  No CMT No vaginal odor Thick white vaginal d/c   Lymphadenopathy:     Cervical: No cervical adenopathy.  Skin:    General: Skin is warm and dry.     Findings: No rash.  Neurological:     Mental Status: She is alert.   Psychiatric:        Mood and Affect: Mood normal.           Assessment & Plan:   Problem List Items Addressed This Visit      Musculoskeletal and Integument   Vaginal itching - Primary    White thick d/c and yeast on wet prep tx with 2 doses of  diflucan 3 d apart Update if not starting to improve in a week or if worsening   Gc/chlam swab sent for STD screen-came back normal       Relevant Orders   POCT Wet Prep [facility]) (Completed)   C. trachomatis/N. gonorrhoeae RNA   C. trachomatis/N. gonorrhoeae RNA (Completed)     Other   Screen for STD (sexually transmitted disease)    Gc/chlamydia probe came back negative Pt does not desire HIV or RPR tests  Enc use of condoms       Relevant Orders   C. trachomatis/N. gonorrhoeae RNA   C. trachomatis/N. gonorrhoeae RNA (Completed)

## 2019-01-19 NOTE — Patient Instructions (Signed)
Take diflucan for a yeast infection as directed   Update if not starting to improve in a week or if worsening   Take care of yourself

## 2019-01-20 LAB — C. TRACHOMATIS/N. GONORRHOEAE RNA
C. trachomatis RNA, TMA: NOT DETECTED
N. gonorrhoeae RNA, TMA: NOT DETECTED

## 2019-01-20 NOTE — Assessment & Plan Note (Signed)
Gc/chlamydia probe came back negative Pt does not desire HIV or RPR tests  Enc use of condoms

## 2019-01-20 NOTE — Assessment & Plan Note (Signed)
White thick d/c and yeast on wet prep tx with 2 doses of diflucan 3 d apart Update if not starting to improve in a week or if worsening   Gc/chlam swab sent for STD screen-came back normal

## 2019-01-22 ENCOUNTER — Telehealth: Payer: Self-pay | Admitting: *Deleted

## 2019-01-22 NOTE — Telephone Encounter (Signed)
Left VM requesting pt to call the office back regarding lab results  

## 2019-01-22 NOTE — Telephone Encounter (Signed)
Pt notified of lab results

## 2019-02-01 ENCOUNTER — Telehealth: Payer: Self-pay | Admitting: Internal Medicine

## 2019-02-01 NOTE — Telephone Encounter (Signed)
Patient believes she may have a UTI.   PHONE- [phone]

## 2019-02-01 NOTE — Telephone Encounter (Signed)
Spoke to pt. She said the yeast symptoms felt better but is still having urinary frequency, dysuria. Thought she saw some blood on toilet issue this morning.

## 2019-02-01 NOTE — Telephone Encounter (Signed)
The uti was a while ago and tx with bactrim DS from her gyn (I do not have the culture results)  She can call them about her urinary symptoms or set up an appt with [place] (virtual is ok)- and drop off a urine sample

## 2019-02-01 NOTE — Telephone Encounter (Signed)
Pt said she was seen on 01/19/19 and was advised to cb if still having problems. Pt said came in for UTI and then finished abx and developed yeast infection. Pt not sure if urinary symptoms every completely cleared. Now pt is burning upon urination with frequency and tender in perineal area. Slight yellow creamy vaginal discharge.no vaginal itching. No abd or back pain. No fever,chills,SOB,cough,S/T, muscle pain,diarrhea,h/a or no loss of smell or taste. No travel and no known exposure to covid or flu. Pt wants to know since seen twice recently can med be sent to [facility]. Pt request cb.

## 2019-02-01 NOTE — Telephone Encounter (Signed)
Spoke with patient.  She is going to come by and pick up a urine cup in the am around 830 am and bring back to drop off.   She will have virtual visit with Dr. Milinda Antis at 1200pm. She is aware of the process.

## 2019-02-01 NOTE — Telephone Encounter (Signed)
Thanks--sounds good.

## 2019-02-01 NOTE — Telephone Encounter (Signed)
I treated her for yeast at the last visit- did the symptoms ever resolve?   Did they get better and then come back or never get better?  Will cc her pcp as well

## 2019-02-02 ENCOUNTER — Ambulatory Visit (INDEPENDENT_AMBULATORY_CARE_PROVIDER_SITE_OTHER): Payer: 59 | Admitting: Family Medicine

## 2019-02-02 ENCOUNTER — Encounter: Payer: Self-pay | Admitting: Family Medicine

## 2019-02-02 DIAGNOSIS — N3000 Acute cystitis without hematuria: Secondary | ICD-10-CM | POA: Insufficient documentation

## 2019-02-02 DIAGNOSIS — N3001 Acute cystitis with hematuria: Secondary | ICD-10-CM | POA: Diagnosis not present

## 2019-02-02 DIAGNOSIS — R300 Dysuria: Secondary | ICD-10-CM

## 2019-02-02 LAB — POC URINALSYSI DIPSTICK (AUTOMATED)
Bilirubin, UA: NEGATIVE
Blood, UA: 50
Glucose, UA: NEGATIVE
Ketones, UA: NEGATIVE
Nitrite, UA: NEGATIVE
Protein, UA: NEGATIVE
Spec Grav, UA: 1.01 (ref 1.010–1.025)
Urobilinogen, UA: 0.2 E.U./dL
pH, UA: 6 (ref 5.0–8.0)

## 2019-02-02 MED ORDER — CEPHALEXIN 500 MG PO CAPS: 500.0000 mg | ORAL_CAPSULE | Freq: Three times a day (TID) | ORAL | 0 refills | Status: DC

## 2019-02-02 NOTE — Progress Notes (Signed)
Virtual Visit via Video Note  I connected with Taylor Bautista on 02/02/19 at 12:00 PM EDT by a video enabled telemedicine application and verified that I am speaking with the correct person using two identifiers.  Location: Patient: work/car  Provider: office    I discussed the limitations of evaluation and management by telemedicine and the availability of in person appointments. The patient expressed understanding and agreed to proceed.  History of Present Illness: Pt presents with urinary c/o  UA today Results for orders placed or performed in visit on 02/02/19  POCT Urinalysis Dipstick (Automated)  Result Value Ref Range   Color, UA Yellow    Clarity, UA Hazy    Glucose, UA Negative Negative   Bilirubin, UA Negative    Ketones, UA Negative    Spec Grav, UA 1.010 1.010 - 1.025   Blood, UA 50 Ery/uL    pH, UA 6.0 5.0 - 8.0   Protein, UA Negative Negative   Urobilinogen, UA 0.2 0.2 or 1.0 E.U./dL   Nitrite, UA Negative    Leukocytes, UA Large (3+) (A) Negative    Last visit here tx for yeast early may  Had pos wet prep and was tx with diflucan  She was better right away  Not having menses-about due  BTL=not pregnant    Before that tx for uti by her gyn with bactrim ds (I do not have culture results)  tx for a week  Never felt entirely better from uti  Bad dysuria  Also pain /pressure in bladder area  No blood in urine but had some when she wiped yesterday (thinks was urethra area) No fever  No n/v  Frequency and no urgency  No incontinence  Some flank pain -feels it on both sides   Review of Systems  Constitutional: Negative for chills, fever, malaise/fatigue and weight loss.  HENT: Negative for sore throat.   Respiratory: Negative for cough and shortness of breath.   Cardiovascular: Negative for chest pain and palpitations.  Gastrointestinal: Negative for heartburn, nausea and vomiting.  Genitourinary: Positive for dysuria, flank pain, frequency and  hematuria.  Skin: Negative for itching and rash.  Neurological: Negative for dizziness and headaches.    Patient Active Problem List   Diagnosis Date Noted  . Acute cystitis 02/02/2019  . Vaginal itching 01/19/2019  . Screen for STD (sexually transmitted disease) 01/19/2019  . Episodic paroxysmal anxiety disorder 12/26/2014  . Migraine without aura 12/05/2012  . Allergic rhinitis 03/20/2007   Past Medical History:  Diagnosis Date  . Allergy   . Anxiety    no meds  . Dysmenorrhea   . Dysthymic disorder   . Exposure to STD   . MRSA infection   . Sleep apnea    pt states she does not use her CPAP   . Tobacco use disorder    Past Surgical History:  Procedure Laterality Date  . FOOT SURGERY  2016  . LAPAROSCOPIC TUBAL LIGATION Bilateral 08/02/2018   Procedure: LAPAROSCOPIC TUBAL LIGATION - Filshie Clips;  Surgeon: Lorena BingPickens, Charlie, MD;  Location:  [facility];  Service: Gynecology;  Laterality: Bilateral;  . Pigmented nevus removal  04/1989   Social History   Tobacco Use  . Smoking status: Former Smoker    Types: Cigarettes    Last attempt to quit: 09/27/2018    Years since quitting: 0.3  . Smokeless tobacco: Never Used  Substance Use Topics  . Alcohol use: No    Alcohol/week: 1.0 standard drinks  Types: 1 Cans of beer per week  . 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 Cancer Mother 27       one breast  . Cancer Cousin 24       breast  . Anxiety disorder Other   . Cancer Sister 23       lymph node cancer twice  . Anxiety disorder Sister   . Depression Sister    No Known Allergies Current Outpatient Medications on File Prior to Visit  Medication Sig Dispense Refill  . ibuprofen (ADVIL,MOTRIN) 200 MG tablet Take 200 mg by mouth every 6 (six) hours as needed.    . vitamin B-12 (CYANOCOBALAMIN) 500 MCG tablet Take 500 mcg by mouth daily.     No  current facility-administered medications on file prior to visit.     Observations/Objective: Patient appears well, in no distress Weight is baseline  No facial swelling or asymmetry Normal voice-not hoarse and no slurred speech No obvious tremor or mobility impairment Moving neck and UEs normally No cough or shortness of breath during interview  Talkative and mentally sharp with no cognitive changes No skin changes on face or neck , no rash or pallor Affect is normal    Assessment and Plan: Problem List Items Addressed This Visit      Genitourinary   Acute cystitis - Primary    Possibly recurrent  Was tx initially with bactrim from gyn (we do not have a cx report) Pos UA  Enc water intake  tx with keflex 500 mg tid  Pending culture-will update  Update if not starting to improve in several days or if worsening        Relevant Orders   Urine Culture (Completed)    Other Visit Diagnoses    Dysuria       Relevant Orders   POCT Urinalysis Dipstick (Automated) (Completed)        Follow Up Instructions: I think you have another UTI (or the same one never cleared up completely) Take the generic keflex as directed Drink lots of water  Alert [place] if symptoms worsen at any time Also if fever or worse flank pain or nausea   Culture is pending-we will contact you Monday about it   Update if not starting to improve in several days or if worsening     I discussed the assessment and treatment plan with the patient. The patient was provided an opportunity to ask questions and all were answered. The patient agreed with the plan and demonstrated an understanding of the instructions.   The patient was advised to call back or seek an in-person evaluation if the symptoms worsen or if the condition fails to improve as anticipated.     Roxy Manns, MD

## 2019-02-02 NOTE — Patient Instructions (Signed)
I think you have another UTI (or the same one never cleared up completely) Take the generic keflex as directed Drink lots of water  Alert [place] if symptoms worsen at any time Also if fever or worse flank pain or nausea   Culture is pending-we will contact you Monday about it   Update if not starting to improve in several days or if worsening

## 2019-02-03 LAB — URINE CULTURE
MICRO NUMBER:: 500444
Result:: NO GROWTH
SPECIMEN QUALITY:: ADEQUATE

## 2019-02-04 NOTE — Assessment & Plan Note (Signed)
Possibly recurrent  Was tx initially with bactrim from gyn (we do not have a cx report) Pos UA  Enc water intake  tx with keflex 500 mg tid  Pending culture-will update  Update if not starting to improve in several days or if worsening

## 2019-02-04 NOTE — Progress Notes (Signed)
Visit was 5/22

## 2019-02-06 ENCOUNTER — Telehealth: Payer: Self-pay | Admitting: *Deleted

## 2019-02-06 MED ORDER — FLUCONAZOLE 150 MG PO TABS: 150.0000 mg | ORAL_TABLET | Freq: Once | ORAL | 0 refills | Status: AC

## 2019-02-06 NOTE — Telephone Encounter (Signed)
-----   Message from Judy Pimple, MD sent at 02/06/2019 12:32 PM EDT ----- She can stop the abx  Please send diflucan 150 mg 1 po times one #1 no refills to her pharmacy  Alert [place] if no further improvement  Also drink lots of water

## 2019-02-06 NOTE — Telephone Encounter (Signed)
Pt aware of dr tower and shapales comments

## 2019-02-06 NOTE — Telephone Encounter (Signed)
Rx sent to pharmacy   

## 2019-02-28 ENCOUNTER — Ambulatory Visit (INDEPENDENT_AMBULATORY_CARE_PROVIDER_SITE_OTHER): Payer: 59 | Admitting: *Deleted

## 2019-02-28 ENCOUNTER — Other Ambulatory Visit: Payer: Self-pay

## 2019-02-28 VITALS — BP 111/79 | HR 81

## 2019-02-28 DIAGNOSIS — N390 Urinary tract infection, site not specified: Secondary | ICD-10-CM | POA: Diagnosis not present

## 2019-02-28 DIAGNOSIS — N898 Other specified noninflammatory disorders of vagina: Secondary | ICD-10-CM

## 2019-02-28 DIAGNOSIS — B373 Candidiasis of vulva and vagina: Secondary | ICD-10-CM | POA: Diagnosis not present

## 2019-02-28 LAB — POCT URINALYSIS DIPSTICK
Blood, UA: NEGATIVE
Leukocytes, UA: NEGATIVE

## 2019-02-28 MED ORDER — FLUCONAZOLE 150 MG PO TABS: 150.0000 mg | ORAL_TABLET | Freq: Once | ORAL | 2 refills | Status: AC

## 2019-02-28 NOTE — Progress Notes (Signed)
ATTESTATION OF SUPERVISION OF RN: Evaluation and management procedures were performed by the RN under my supervision and collaboration. I have reviewed the nursing note and chart and agree with the management and plan for this patient.  Sher Hellinger, CNM  

## 2019-02-28 NOTE — Progress Notes (Signed)
SUBJECTIVE: Taylor Bautista is a 28 y.o. female who complains of urinary frequency, urgency and dysuria x 2 days, without flank pain, fever, chills,  or bleeding. Pt also complains of vaginal discharge and irritation.   OBJECTIVE: Appears well, in no apparent distress.  Vital signs are normal. Urine dipstick shows negative for all components.    ASSESSMENT: Dysuria and vaginal discharge  PLAN: Will send urine culture and self swab for C-Vag and B-vag. Treatment per orders.  Call or return to clinic prn if these symptoms worsen or fail to improve as anticipated.

## 2019-03-01 ENCOUNTER — Telehealth: Payer: Self-pay | Admitting: *Deleted

## 2019-03-01 LAB — URINE CULTURE

## 2019-03-01 LAB — CERVICOVAGINAL ANCILLARY ONLY
Bacterial vaginitis: NEGATIVE
Candida vaginitis: POSITIVE — AB

## 2019-03-01 NOTE — Telephone Encounter (Signed)
Pt informed of results.

## 2019-04-24 ENCOUNTER — Ambulatory Visit (INDEPENDENT_AMBULATORY_CARE_PROVIDER_SITE_OTHER): Payer: 59 | Admitting: Internal Medicine

## 2019-04-24 ENCOUNTER — Encounter: Payer: Self-pay | Admitting: Internal Medicine

## 2019-04-24 DIAGNOSIS — R52 Pain, unspecified: Secondary | ICD-10-CM

## 2019-04-24 DIAGNOSIS — J029 Acute pharyngitis, unspecified: Secondary | ICD-10-CM

## 2019-04-24 DIAGNOSIS — G44201 Tension-type headache, unspecified, intractable: Secondary | ICD-10-CM

## 2019-04-24 DIAGNOSIS — Z20822 Contact with and (suspected) exposure to covid-19: Secondary | ICD-10-CM

## 2019-04-24 DIAGNOSIS — R0981 Nasal congestion: Secondary | ICD-10-CM

## 2019-04-24 DIAGNOSIS — Z20828 Contact with and (suspected) exposure to other viral communicable diseases: Secondary | ICD-10-CM

## 2019-04-24 NOTE — Progress Notes (Signed)
Virtual Visit via Video Note  I connected with Taylor Bautista on 04/24/19 at  4:00 PM EDT by a video enabled telemedicine application and verified that I am speaking with the correct person using two identifiers.  Location: Patient: Work Provider: Office   I discussed the limitations of evaluation and management by telemedicine and the availability of in person appointments. The patient expressed understanding and agreed to proceed.  History of Present Illness:   Pt reports headache, nasal congestion, and sore throat. This started yesterday. She describes the headache as pressure in the forehead. She reports some associated dizziness but denies visual changes. She is not able to blow anything out of her nose. She denies difficulty swallowing. She denies runny nose, ear pain, SOB, or loss of taste or smell. She denies fever or chills but has had body aches. She has tried Ibuprofen with minimal relief. She has had sick contacts diagnosed with COVID 19.  HPI  Pt presents to the clinic today with c/o   Review of Systems      Past Medical History:  Diagnosis Date  . Allergy   . Anxiety    no meds  . Dysmenorrhea   . Dysthymic disorder   . Exposure to STD   . MRSA infection   . Sleep apnea    pt states she does not use her CPAP   . Tobacco use disorder     Family History  Problem Relation Age of Onset  . Cancer Mother 2       one breast  . Cancer Cousin 24       breast  . Anxiety disorder Other   . Cancer Sister 54       lymph node cancer twice  . Anxiety disorder Sister   . Depression Sister     Social History   Socioeconomic History  . Marital status: Divorced    Spouse name: Not on file  . Number of children: Not on file  . Years of education: Not on file  . Highest education level: Not on file  Occupational History  . Not on file  Social Needs  . Financial resource strain: Not on file  . Food insecurity    Worry: Not on file    Inability: Not on file   . Transportation needs    Medical: Not on file    Non-medical: Not on file  Tobacco Use  . Smoking status: Former Smoker    Types: Cigarettes    Quit date: 09/27/2018    Years since quitting: 0.5  . Smokeless tobacco: Never Used  Substance and Sexual Activity  . Alcohol use: No    Alcohol/week: 1.0 standard drinks    Types: 1 Cans of beer per week  . 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 Sexual activity: Not Currently    Partners: Male    Birth control/protection: I.U.D.  Lifestyle  . Physical activity    Days per week: Not on file    Minutes per session: Not on file  . Stress: Not on file  Relationships  . Social Herbalist on phone: Not on file    Gets together: Not on file    Attends religious service: Not on file    Active member of club or organization: Not on file    Attends meetings of clubs or organizations: Not on file    Relationship status: Not  on file  . Intimate partner violence    Fear of current or ex partner: Not on file    Emotionally abused: Not on file    Physically abused: Not on file    Forced sexual activity: Not on file  Other Topics Concern  . Not on file  Social History Narrative   Lives with mother      Works at [employer]          No Known Allergies   Constitutional: Positive headache, fatigue and body aches. Denies fever, abrupt weight changes.  HEENT:  Positive nasal congestion, sore throat. Denies eye redness, eye pain, pressure behind the eyes, facial pain, nasal congestion, ear pain, ringing in the ears, wax buildup, runny nose or bloody nose. Respiratory:  Denies cough, difficulty breathing or shortness of breath.  Cardiovascular: Denies chest pain, chest tightness, palpitations or swelling in the hands or feet.   No other specific complaints in a complete review of systems (except as listed in HPI above).  Objective:    Wt Readings from Last 3  Encounters:  01/19/19 146 lb 5 oz (66.4 kg)  12/12/18 148 lb (67.1 kg)  12/04/18 148 lb (67.1 kg)     General: Appears her stated age,  in NAD. HEENT: Head: normal shape and size; Eyes: sclera white, no icterus, conjunctiva pink;  Pulmonary/Chest: Normal effort. No respiratory distress.       Assessment & Plan:   Headache, Nasal Congestion, Sore Throat, Body Aches and Exposure to COVID:  Get some rest and drink plenty of water Do salt water gargles for the sore throat Start Zyrtec and Flonase OTC Ibuprofen as needed for body aches Advised her to proceed [facility] 8[facility]3:30 for Novel Coronavirus testing.  Will follow up after lab results.  RTC as needed or if symptoms persist.   Nicki Reaperegina Heily Carlucci, NP    Follow Up Instructions:    I discussed the assessment and treatment plan with the patient. The patient was provided an opportunity to ask questions and all were answered. The patient agreed with the plan and demonstrated an understanding of the instructions.   The patient was advised to call back or seek an in-person evaluation if the symptoms worsen or if the condition fails to improve as anticipated.    Nicki Reaperegina Avanell Banwart, NP

## 2019-04-24 NOTE — Patient Instructions (Signed)
COVID-19: How to Protect Yourself and Others Know how it spreads  There is currently no vaccine to prevent coronavirus disease 2019 (COVID-19).  The best way to prevent illness is to avoid being exposed to this virus.  The virus is thought to spread mainly from person-to-person. ? Between people who are in close contact with one another (within about 6 feet). ? Through respiratory droplets produced when an infected person coughs, sneezes or talks. ? These droplets can land in the mouths or noses of people who are nearby or possibly be inhaled into the lungs. ? Some recent studies have suggested that COVID-19 may be spread by people who are not showing symptoms. Everyone should Clean your hands often  Wash your hands often with soap and water for at least 20 seconds especially after you have been in a public place, or after blowing your nose, coughing, or sneezing.  If soap and water are not readily available, use a hand sanitizer that contains at least 60% alcohol. Cover all surfaces of your hands and rub them together until they feel dry.  Avoid touching your eyes, nose, and mouth with unwashed hands. Avoid close contact  Stay home if you are sick.  Avoid close contact with people who are sick.  Put distance between yourself and other people. ? Remember that some people without symptoms may be able to spread virus. ? This is especially important for people who are at higher risk of getting very sick.www.cdc.gov/coronavirus/2019-ncov/need-extra-precautions/people-at-higher-risk.html Cover your mouth and nose with a cloth face cover when around others  You could spread COVID-19 to others even if you do not feel sick.  Everyone should wear a cloth face cover when they have to go out in public, for example to the grocery store or to pick up other necessities. ? Cloth face coverings should not be placed on young children under age 2, anyone who has trouble breathing, or is unconscious,  incapacitated or otherwise unable to remove the mask without assistance.  The cloth face cover is meant to protect other people in case you are infected.  Do NOT use a facemask meant for a healthcare worker.  Continue to keep about 6 feet between yourself and others. The cloth face cover is not a substitute for social distancing. Cover coughs and sneezes  If you are in a private setting and do not have on your cloth face covering, remember to always cover your mouth and nose with a tissue when you cough or sneeze or use the inside of your elbow.  Throw used tissues in the trash.  Immediately wash your hands with soap and water for at least 20 seconds. If soap and water are not readily available, clean your hands with a hand sanitizer that contains at least 60% alcohol. Clean and disinfect  Clean AND disinfect frequently touched surfaces daily. This includes tables, doorknobs, light switches, countertops, handles, desks, phones, keyboards, toilets, faucets, and sinks. www.cdc.gov/coronavirus/2019-ncov/prevent-getting-sick/disinfecting-your-home.html  If surfaces are dirty, clean them: Use detergent or soap and water prior to disinfection.  Then, use a household disinfectant. You can see a list of EPA-registered household disinfectants here. cdc.gov/coronavirus 01/16/2019 This information is not intended to replace advice given to you by your health care provider. Make sure you discuss any questions you have with your health care provider. Document Released: 12/26/2018 Document Revised: 01/24/2019 Document Reviewed: 12/26/2018 Elsevier Patient Education  2020 Elsevier Inc.  

## 2019-04-25 ENCOUNTER — Other Ambulatory Visit: Payer: Self-pay

## 2019-04-25 DIAGNOSIS — Z20822 Contact with and (suspected) exposure to covid-19: Secondary | ICD-10-CM

## 2019-04-27 LAB — NOVEL CORONAVIRUS, NAA: SARS-CoV-2, NAA: NOT DETECTED

## 2019-05-03 ENCOUNTER — Other Ambulatory Visit: Payer: Self-pay | Admitting: *Deleted

## 2019-05-03 MED ORDER — METRONIDAZOLE 500 MG PO TABS: 500.0000 mg | ORAL_TABLET | Freq: Two times a day (BID) | ORAL | 0 refills | Status: DC

## 2019-06-25 ENCOUNTER — Telehealth: Payer: Self-pay | Admitting: Internal Medicine

## 2019-06-25 DIAGNOSIS — E538 Deficiency of other specified B group vitamins: Secondary | ICD-10-CM

## 2019-06-25 NOTE — Telephone Encounter (Signed)
Best number [phone]  Pt called wanting to know if you could send b12 rx to pharmacy and she give herself the inj every month  [facility]

## 2019-06-26 NOTE — Telephone Encounter (Signed)
Patient called back.  Patient wants to know if this request can be sent to another provider since Regina's off today and she wants it called in to the pharmacy as soon as possible.

## 2019-06-26 NOTE — Telephone Encounter (Signed)
She would need to come in the office to be observed by [facility] for her first dose to make sure she is giving correctly. If she is okay with this, ok to send in B12.

## 2019-06-27 NOTE — Telephone Encounter (Signed)
I will need to call her next week once I know when I can return to the office.  Hard for me to schedule right now as I don't know how long I will be working remotely.   Also, I am not seeing current B12 orders in the system.  I will copy Rollene Fare  on this for clarity on patient need.  Looks like patient is on oral b12 in med list and last b12 level was over 1 year ago.  I see okay to start below but want to be clear that we are okay starting Vitamin B12 1039mcg IM once monthly starting as soon as can be scheduled?  Does not appear patient has received injections from [place] in office before and was on oral therapy.    Should we also plan a B12 level recheck in 2-3 months?   Courtney,   Please call the patient and see if I can set up next week sometime (preferrably Wednesday if I am back in the office by then).   Thank you to everyone involved.

## 2019-06-27 NOTE — Telephone Encounter (Signed)
Patient stated that she was okay doing the training with Maryln Gottron, could you call this patient to schedule a time for her to come and see you. I wasn't sure what days or times work best for this type of visit

## 2019-06-27 NOTE — Telephone Encounter (Signed)
Patient stated that Wednesday would be fine for her.  Would like a call back whenever you have a good time for her. I did not see any open slots on the nurse visit to put her there?

## 2019-06-28 ENCOUNTER — Other Ambulatory Visit (INDEPENDENT_AMBULATORY_CARE_PROVIDER_SITE_OTHER): Payer: 59

## 2019-06-28 DIAGNOSIS — E538 Deficiency of other specified B group vitamins: Secondary | ICD-10-CM

## 2019-06-28 NOTE — Telephone Encounter (Signed)
Agree, needs B12 level checked first. Have her schedule lab only appt

## 2019-06-28 NOTE — Addendum Note (Signed)
Addended by: Jearld Fenton on: 06/28/2019 10:00 AM   Modules accepted: Orders

## 2019-06-29 LAB — VITAMIN B12: Vitamin B-12: 264 pg/mL (ref 211–911)

## 2019-07-03 ENCOUNTER — Telehealth: Payer: Self-pay | Admitting: Internal Medicine

## 2019-07-03 NOTE — Telephone Encounter (Signed)
Patient called.  She had labs done for her b-12 last week and she's waiting to find out the results.  Patient would like to know her results as soon as possible.  Patient uses [facility].

## 2019-07-03 NOTE — Telephone Encounter (Signed)
Spoke with patient and reviewed her results. Training set up for this Thursday to begin in home administration. Will send in medication and supplies to pharmacy at that time.   Thanks.

## 2019-07-05 ENCOUNTER — Ambulatory Visit (INDEPENDENT_AMBULATORY_CARE_PROVIDER_SITE_OTHER): Payer: 59

## 2019-07-05 ENCOUNTER — Telehealth: Payer: Self-pay

## 2019-07-05 DIAGNOSIS — E538 Deficiency of other specified B group vitamins: Secondary | ICD-10-CM

## 2019-07-05 MED ORDER — CYANOCOBALAMIN 1000 MCG/ML IJ SOLN
1000.0000 ug | Freq: Once | INTRAMUSCULAR | Status: AC
  Administered 2019-07-05: 1000 ug via INTRAMUSCULAR

## 2019-07-05 MED ORDER — CYANOCOBALAMIN 1000 MCG/ML IJ SOLN: INTRAMUSCULAR | 1 refills | Status: DC

## 2019-07-05 NOTE — Telephone Encounter (Signed)
Taylor Bautista,  I meant to have patient schedule her next lab appointment before she left today.   Can you please call her and set her up for a vitamin B12 lab check in 43mths.  Please tell her to be sure she does not give herself a b12 shot on the same day she comes for the lab.  This is very important so as to not give [place] a false reading on her level.   Thanks so much!

## 2019-07-05 NOTE — Progress Notes (Signed)
Per orders of Webb Silversmith, NP, injection of Vitamin B12 given by Diamond Nickel, RN. Patient tolerated injection well.  Patient in today for training and administration of self injection of Vitmain B12.   Education provided with practice demonstration.  Patient provided time to ask questions and then was able to demonstrate safe self administration of B12 intramuscularly to left thigh.  At this time I feel comfortable with patient performing self injections and will send in medication and all needed supplies to patients pharmacy [facility].    She is aware to perform injections every 2 weeks for the next 2 months and then once monthly for the following 4 before rechecking B12 level in 29mths.    Will have scheduler contact patient to make lab appointment for 65mths.   Total time spent with patient 24minutes.

## 2019-07-13 ENCOUNTER — Ambulatory Visit (INDEPENDENT_AMBULATORY_CARE_PROVIDER_SITE_OTHER): Payer: 59 | Admitting: Internal Medicine

## 2019-07-13 ENCOUNTER — Encounter: Payer: Self-pay | Admitting: Internal Medicine

## 2019-07-13 VITALS — Temp 98.6°F

## 2019-07-13 DIAGNOSIS — R05 Cough: Secondary | ICD-10-CM | POA: Diagnosis not present

## 2019-07-13 DIAGNOSIS — R0989 Other specified symptoms and signs involving the circulatory and respiratory systems: Secondary | ICD-10-CM | POA: Diagnosis not present

## 2019-07-13 DIAGNOSIS — R059 Cough, unspecified: Secondary | ICD-10-CM

## 2019-07-13 DIAGNOSIS — R52 Pain, unspecified: Secondary | ICD-10-CM

## 2019-07-13 NOTE — Progress Notes (Signed)
Virtual Visit via Video Note  I connected with Taylor Bautista on 07/13/19 at  3:00 PM EDT by a video enabled telemedicine application and verified that I am speaking with the correct person using two identifiers.  Location: Patient: Home Provider: Office   I discussed the limitations of evaluation and management by telemedicine and the availability of in person appointments. The patient expressed understanding and agreed to proceed.  History of Present Illness:   Patient complains of body aches, runny nose and cough. This started 4 days ago She mentions she feels achy, weak and sore.  She describes her cough as dry and non-productive with little mucus. She has a yellow discharge from her runny nose. She reports associated shortness of breath and chest tightness. Patient states she has tried Ibuprofin and Aleve during the day for her aches and Nyquil at night to help with her sleep. She denies any fevers and checked her temp today at 98.6 F.  She denies any sick contacts. She has had COVID in the past.   Past Medical History:  Diagnosis Date  . Allergy   . Anxiety    no meds  . Dysmenorrhea   . Dysthymic disorder   . Exposure to STD   . MRSA infection   . Sleep apnea    pt states she does not use her CPAP   . Tobacco use disorder    Family History  Problem Relation Age of Onset  . Cancer Mother 39       one breast  . Cancer Cousin 24       breast  . Anxiety disorder Other   . Cancer Sister 74       lymph node cancer twice  . Anxiety disorder Sister   . Depression Sister    Relationships  Social connections  . Talks on phone: Not on file  . Gets together: Not on file  . Attends religious service: Not on file  . Active member of club or organization: Not on file  . Attends meetings of clubs or organizations: Not on file  . Relationship status: Not on file   Scheduled Meds: Continuous Infusions: PRN Meds:.  Constitutional: Denies fevers, chills, headache or abrupt  weight changes. HEENT:  Pt report runny nose. Denies nasal congestion, ear pain, or sore throat. Respiratory: Pt reports cough and shortness of breath. Denies difficulty breathing. Cardiovascular: Pt reports chest tightness. Denies chest pain, swelling in the lower extremities.  MSK: Pt reports generalized body aches. Denies joint pain, weakness or difficulty with gait.    Observations/Objective:  General: Well developed, no apparent distress EENT: Does not sound congested, no sneezing Respiratory: No coughing, normal breathing, non-labored Neuro: Alert and oriented.  Recent Results (from the past 2160 hour(s))  Novel Coronavirus, NAA ([facility])     Status: None   Collection Time: 04/25/19 12:00 AM   Specimen: Oropharyngeal(OP) collection in vial transport medium   OROPHARYNGEA  TESTING  Result Value Ref Range   SARS-CoV-2, NAA Not Detected Not Detected    Comment: Testing was performed using the cobas(R) SARS-CoV-2 test. This test was developed and its performance characteristics determined by [employer]. This test has not been FDA cleared or approved. This test has been authorized by FDA under an Emergency Use Authorization (EUA). This test is only authorized for the duration of time the declaration that circumstances exist justifying the authorization of the emergency use of in vitro diagnostic tests for detection of SARS-CoV-2 virus and/or diagnosis of COVID-19  infection under section 564(b)(1) of the Act, 21 U.S.C. 185UDJ-4(H)(7), unless the authorization is terminated or revoked sooner. When diagnostic testing is negative, the possibility of a false negative result should be considered in the context of a patient's recent exposures and the presence of clinical signs and symptoms consistent with COVID-19. An individual without symptoms of COVID-19 and who is not shedding SARS-CoV-2 virus would expect to have a negati ve (not detected) result in this assay.    Vitamin B12     Status: None   Collection Time: 06/28/19  3:53 PM  Result Value Ref Range   Vitamin B-12 264 211 - 911 pg/mL    Assessment and Plan:  Body Aches, Runny Nose and Cough:  WYO:[id number] rhinitis vs Influenza vs COVID  Advised her to try OTC Zyrtec and Flonase to aid with sympoms Symptomatic treatment including rest and plenty fluids Advised her to get tested for COVID Discussed importance of masking, social distancing, and routine hand washing Work note provided  Follow Up Instructions:    I discussed the assessment and treatment plan with the patient. The patient was provided an opportunity to ask questions and all were answered. The patient agreed with the plan and demonstrated an understanding of the instructions.   The patient was advised to call back or seek an in-person evaluation if the symptoms worsen or if the condition fails to improve as anticipated.     Webb Silversmith, NP

## 2019-07-15 ENCOUNTER — Encounter: Payer: Self-pay | Admitting: Internal Medicine

## 2019-07-15 NOTE — Patient Instructions (Signed)
Viral Illness, Adult °Viruses are tiny germs that can get into a person's body and cause illness. There are many different types of viruses, and they cause many types of illness. Viral illnesses can range from mild to severe. They can affect various parts of the body. °Common illnesses that are caused by a virus include colds and the flu. Viral illnesses also include serious conditions such as HIV/AIDS (human immunodeficiency virus/acquired immunodeficiency syndrome). A few viruses have been linked to certain cancers. °What are the causes? °Many types of viruses can cause illness. Viruses invade cells in your body, multiply, and cause the infected cells to malfunction or die. When the cell dies, it releases more of the virus. When this happens, you develop symptoms of the illness, and the virus continues to spread to other cells. If the virus takes over the function of the cell, it can cause the cell to divide and grow out of control, as is the case when a virus causes cancer. °Different viruses get into the body in different ways. You can get a virus by: °· Swallowing food or water that is contaminated with the virus. °· Breathing in droplets that have been coughed or sneezed into the air by an infected person. °· Touching a surface that has been contaminated with the virus and then touching your eyes, nose, or mouth. °· Being bitten by an insect or animal that carries the virus. °· Having sexual contact with a person who is infected with the virus. °· Being exposed to blood or fluids that contain the virus, either through an open cut or during a transfusion. °If a virus enters your body, your body's defense system (immune system) will try to fight the virus. You may be at higher risk for a viral illness if your immune system is weak. °What are the signs or symptoms? °Symptoms vary depending on the type of virus and the location of the cells that it invades. Common symptoms of the main types of viral illnesses  include: °Cold and flu viruses °· Fever. °· Headache. °· Sore throat. °· Muscle aches. °· Nasal congestion. °· Cough. °Digestive system (gastrointestinal) viruses °· Fever. °· Abdominal pain. °· Nausea. °· Diarrhea. °Liver viruses (hepatitis) °· Loss of appetite. °· Tiredness. °· Yellowing of the skin (jaundice). °Brain and spinal cord viruses °· Fever. °· Headache. °· Stiff neck. °· Nausea and vomiting. °· Confusion or sleepiness. °Skin viruses °· Warts. °· Itching. °· Rash. °Sexually transmitted viruses °· Discharge. °· Swelling. °· Redness. °· Rash. °How is this treated? °Viruses can be difficult to treat because they live within cells. Antibiotic medicines do not treat viruses because these drugs do not get inside cells. Treatment for a viral illness may include: °· Resting and drinking plenty of fluids. °· Medicines to relieve symptoms. These can include over-the-counter medicine for pain and fever, medicines for cough or congestion, and medicines to relieve diarrhea. °· Antiviral medicines. These drugs are available only for certain types of viruses. They may help reduce flu symptoms if taken early. There are also many antiviral medicines for hepatitis and HIV/AIDS. °Some viral illnesses can be prevented with vaccinations. A common example is the flu shot. °Follow these instructions at home: °Medicines ° °· Take over-the-counter and prescription medicines only as told by your health care provider. °· If you were prescribed an antiviral medicine, take it as told by your health care provider. Do not stop taking the medicine even if you start to feel better. °· Be aware of when   antibiotics are needed and when they are not needed. Antibiotics do not treat viruses. If your health care provider thinks that you may have a bacterial infection as well as a viral infection, you may get an antibiotic. °? Do not ask for an antibiotic prescription if you have been diagnosed with a viral illness. That will not make your  illness go away faster. °? Frequently taking antibiotics when they are not needed can lead to antibiotic resistance. When this develops, the medicine no longer works against the bacteria that it normally fights. °General instructions °· Drink enough fluids to keep your urine clear or pale yellow. °· Rest as much as possible. °· Return to your normal activities as told by your health care provider. Ask your health care provider what activities are safe for you. °· Keep all follow-up visits as told by your health care provider. This is important. °How is this prevented? °Take these actions to reduce your risk of viral infection: °· Eat a healthy diet and get enough rest. °· Wash your hands often with soap and water. This is especially important when you are in public places. If soap and water are not available, use hand sanitizer. °· Avoid close contact with friends and family who have a viral illness. °· If you travel to areas where viral gastrointestinal infection is common, avoid drinking water or eating raw food. °· Keep your immunizations up to date. Get a flu shot every year as told by your health care provider. °· Do not share toothbrushes, nail clippers, razors, or needles with other people. °· Always practice safe sex. ° °Contact a health care provider if: °· You have symptoms of a viral illness that do not go away. °· Your symptoms come back after going away. °· Your symptoms get worse. °Get help right away if: °· You have trouble breathing. °· You have a severe headache or a stiff neck. °· You have severe vomiting or abdominal pain. °This information is not intended to replace advice given to you by your health care provider. Make sure you discuss any questions you have with your health care provider. °Document Released: 01/09/2016 Document Revised: 08/12/2017 Document Reviewed: 01/09/2016 °Elsevier Patient Education © 2020 Elsevier Inc. ° °

## 2019-07-27 ENCOUNTER — Other Ambulatory Visit: Payer: Self-pay | Admitting: Internal Medicine

## 2019-07-27 ENCOUNTER — Other Ambulatory Visit: Payer: Self-pay

## 2019-07-27 MED ORDER — FLUCONAZOLE 150 MG PO TABS: 150.0000 mg | ORAL_TABLET | Freq: Once | ORAL | 1 refills | Status: AC

## 2019-07-27 NOTE — Telephone Encounter (Signed)
Patient thinks she has yeast infection and would like something called into her pharmacy.  

## 2019-08-08 ENCOUNTER — Other Ambulatory Visit: Payer: Self-pay

## 2019-08-08 MED ORDER — NITROFURANTOIN MONOHYD MACRO 100 MG PO CAPS: 100.0000 mg | ORAL_CAPSULE | Freq: Two times a day (BID) | ORAL | 0 refills | Status: DC

## 2019-08-08 NOTE — Telephone Encounter (Signed)
Received call from patient she is concern she has a UTI and [facility]. I have advised her we can call in something to help but it is advised that she come in and left a urine so we can send it off and be able to treat her with the right antibiotic. Patient voice understanding at this time.

## 2019-09-17 ENCOUNTER — Encounter: Payer: Self-pay | Admitting: Internal Medicine

## 2019-09-17 ENCOUNTER — Ambulatory Visit (INDEPENDENT_AMBULATORY_CARE_PROVIDER_SITE_OTHER): Payer: 59 | Admitting: Obstetrics & Gynecology

## 2019-09-17 ENCOUNTER — Ambulatory Visit (INDEPENDENT_AMBULATORY_CARE_PROVIDER_SITE_OTHER): Payer: 59 | Admitting: Internal Medicine

## 2019-09-17 ENCOUNTER — Other Ambulatory Visit: Payer: Self-pay

## 2019-09-17 VITALS — BP 112/78 | HR 78 | Wt 128.2 lb

## 2019-09-17 DIAGNOSIS — Z1151 Encounter for screening for human papillomavirus (HPV): Secondary | ICD-10-CM | POA: Diagnosis not present

## 2019-09-17 DIAGNOSIS — Z0000 Encounter for general adult medical examination without abnormal findings: Secondary | ICD-10-CM | POA: Diagnosis not present

## 2019-09-17 DIAGNOSIS — N730 Acute parametritis and pelvic cellulitis: Secondary | ICD-10-CM

## 2019-09-17 DIAGNOSIS — N898 Other specified noninflammatory disorders of vagina: Secondary | ICD-10-CM | POA: Diagnosis not present

## 2019-09-17 DIAGNOSIS — R300 Dysuria: Secondary | ICD-10-CM | POA: Diagnosis not present

## 2019-09-17 DIAGNOSIS — N760 Acute vaginitis: Secondary | ICD-10-CM | POA: Diagnosis not present

## 2019-09-17 DIAGNOSIS — B9789 Other viral agents as the cause of diseases classified elsewhere: Secondary | ICD-10-CM | POA: Diagnosis not present

## 2019-09-17 DIAGNOSIS — B9689 Other specified bacterial agents as the cause of diseases classified elsewhere: Secondary | ICD-10-CM | POA: Diagnosis not present

## 2019-09-17 DIAGNOSIS — J329 Chronic sinusitis, unspecified: Secondary | ICD-10-CM | POA: Diagnosis not present

## 2019-09-17 DIAGNOSIS — Z124 Encounter for screening for malignant neoplasm of cervix: Secondary | ICD-10-CM | POA: Diagnosis not present

## 2019-09-17 DIAGNOSIS — Z113 Encounter for screening for infections with a predominantly sexual mode of transmission: Secondary | ICD-10-CM | POA: Diagnosis not present

## 2019-09-17 DIAGNOSIS — R102 Pelvic and perineal pain: Secondary | ICD-10-CM

## 2019-09-17 LAB — POCT URINE QUALITATIVE DIPSTICK BLOOD: Blood, UA: NORMAL

## 2019-09-17 MED ORDER — FLUTICASONE PROPIONATE 50 MCG/ACT NA SUSP: 2.0000 | Freq: Every day | NASAL | 0 refills | Status: DC

## 2019-09-17 MED ORDER — DOXYCYCLINE HYCLATE 100 MG PO CAPS: 100.0000 mg | ORAL_CAPSULE | Freq: Two times a day (BID) | ORAL | 0 refills | Status: DC

## 2019-09-17 MED ORDER — CEFTRIAXONE SODIUM 250 MG IJ SOLR
250.0000 mg | Freq: Once | INTRAMUSCULAR | Status: AC
  Administered 2019-09-17: 250 mg via INTRAMUSCULAR

## 2019-09-17 NOTE — Patient Instructions (Signed)

## 2019-09-17 NOTE — Progress Notes (Signed)
Virtual Visit via Video Note  I connected with Taylor Bautista on 09/17/19 at  9:30 AM EST by a video enabled telemedicine application and verified that I am speaking with the correct person using two identifiers.  Location: Patient: Home Provider: Office   I discussed the limitations of evaluation and management by telemedicine and the availability of in person appointments. The patient expressed understanding and agreed to proceed.  HPI  Pt presents to the clinic today with c/o headache, facial pressure and nasal congestion. This started 4 days ago. The headache is locate in her forehead. She denies dizziness, visual changes or sensitivity to light. She is blowing green mucous out of her nose. She denies eye pain, eye redness, runny nose, ear pain, sore throat, cough, or SOB. She denies fever, chills or body aches. She has tried Ibuprofen with minimal relief. She has a history of allergies but does not take allergies meds. She does smoke. She recently just finished quarantine for 14 days for COVID exposure, but she tested negative.  Review of Systems     Past Medical History:  Diagnosis Date  . Allergy   . Anxiety    no meds  . Dysmenorrhea   . Dysthymic disorder   . Exposure to STD   . MRSA infection   . Sleep apnea    pt states she does not use her CPAP   . Tobacco use disorder     Family History  Problem Relation Age of Onset  . Cancer Mother 8       one breast  . Cancer Cousin 24       breast  . Anxiety disorder Other   . Cancer Sister 43       lymph node cancer twice  . Anxiety disorder Sister   . Depression Sister     Social History   Socioeconomic History  . Marital status: Divorced    Spouse name: Not on file  . Number of children: Not on file  . Years of education: Not on file  . Highest education level: Not on file  Occupational History  . Not on file  Tobacco Use  . Smoking status: Former Smoker    Types: Cigarettes    Quit date: 09/27/2018   Years since quitting: 0.9  . Smokeless tobacco: Never Used  Substance and Sexual Activity  . Alcohol use: No    Alcohol/week: 1.0 standard drinks    Types: 1 Cans of beer per week  . 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 Sexual activity: Not Currently    Partners: Male    Birth control/protection: I.U.D.  Other Topics Concern  . Not on file  Social History Narrative   Lives with mother      Works at [employer] Strain:   . Difficulty of Paying Living Expenses: Not on file  Food Insecurity:   . Worried About Charity fundraiser in the Last Year: Not on file  . Ran Out of Food in the Last Year: Not on file  Transportation Needs:   . Lack of Transportation (Medical): Not on file  . Lack of Transportation (Non-Medical): Not on file  Physical Activity:   . Days of Exercise per Week: Not on file  . Minutes of Exercise per Session: Not on file  Stress:   .  Feeling of Stress : Not on file  Social Connections:   . Frequency of Communication with Friends and Family: Not on file  . Frequency of Social Gatherings with Friends and Family: Not on file  . Attends Religious Services: Not on file  . Active Member of Clubs or Organizations: Not on file  . Attends Banker Meetings: Not on file  . Marital Status: Not on file  Intimate Partner Violence:   . Fear of Current or Ex-Partner: Not on file  . Emotionally Abused: Not on file  . Physically Abused: Not on file  . Sexually Abused: Not on file    No Known Allergies   Constitutional: Positive headache. Denies fatigue, fever or abrupt weight changes.  HEENT:  Positive  facial pain, nasal congestion. Denies eye redness, ear pain, ringing in the ears, wax buildup, runny nose or sore throat. Respiratory: Denies cough, difficulty breathing or shortness of breath.  Cardiovascular: Denies chest pain,  chest tightness, palpitations or swelling in the hands or feet.   No other specific complaints in a complete review of systems (except as listed in HPI above).  Objective:    General: Appears her stated age, well developed, well nourished in NAD. HEENT: Head: normal shape and size; Nose: no congestion noted; Throat/Mouth: no hoarseness noted.  Neck:  No adenopathy noted.  Cardiovascular: Normal rate and rhythm. S1,S2 noted.  No murmur, rubs or gallops noted.  Pulmonary/Chest: Normal effort and positive vesicular breath sounds. No respiratory distress. No wheezes, rales or ronchi noted.       Assessment & Plan:   Viral Sinusitis  Can use a Neti Pot which can be purchased from your local drug store. Flonase 2 sprays each nostril for 3 days and then as needed. Start Zyrtec OTC  RTC as needed or if symptoms persist. Nicki Reaper, NP     I discussed the assessment and treatment plan with the patient. The patient was provided an opportunity to ask questions and all were answered. The patient agreed with the plan and demonstrated an understanding of the instructions.   The patient was advised to call back or seek an in-person evaluation if the symptoms worsen or if the condition fails to improve as anticipated.     Nicki Reaper, NP

## 2019-09-17 NOTE — Progress Notes (Signed)
Patient ID: Taylor Bautista, female   DOB: 1990/10/21, 29 y.o.   MRN: [id number]  No chief complaint on file.   HPI Taylor Bautista is a 29 y.o.  Separated P2 (9 and 6 yo kids) here today with midline suprapubic pain for 3-4 days. It is worse with voiding. She has tried IBU and a reflux medication but no help with her pain. She rates the pain a 7. It started right after she had sex on Friday. It hurt again when she had sex the next morning.  She just started having sex with this guy (again) 2 weeks ago.  She is having normal bowel function, not constipated.   Past Medical History:  Diagnosis Date  . Allergy   . Anxiety    no meds  . Dysmenorrhea   . Dysthymic disorder   . Exposure to STD   . MRSA infection   . Sleep apnea    pt states she does not use her CPAP   . Tobacco use disorder     Past Surgical History:  Procedure Laterality Date  . FOOT SURGERY  2016  . LAPAROSCOPIC TUBAL LIGATION Bilateral 08/02/2018   Procedure: LAPAROSCOPIC TUBAL LIGATION - Filshie Clips;  Surgeon: Flaxton Bing, MD;  Location: [facility];  Service: Gynecology;  Laterality: Bilateral;  . Pigmented nevus removal  04/1989    Family History  Problem Relation Age of Onset  . Cancer Mother 74       one breast  . Cancer Cousin 24       breast  . Anxiety disorder Other   . Cancer Sister 44       lymph node cancer twice  . Anxiety disorder Sister   . Depression Sister     Social History Social History   Tobacco Use  . Smoking status: Former Smoker    Types: Cigarettes    Quit date: 09/27/2018    Years since quitting: 0.9  . Smokeless tobacco: Never Used  Substance Use Topics  . Alcohol use: No    Alcohol/week: 1.0 standard drinks    Types: 1 Cans of beer per week  . 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 cyanocobalamin  (,VITAMIN B-12,) 1000 MCG/ML injection Inject 1 mL (1,000 mcg total) into the muscle every 30 (thirty) days. Starting 09/04/2019 1 mL 3  . fluticasone (FLONASE) 50 MCG/ACT nasal spray Place 2 sprays into both nostrils daily. 16 g 0  . ibuprofen (ADVIL,MOTRIN) 200 MG tablet Take 200 mg by mouth every 6 (six) hours as needed.     No current facility-administered medications for this visit.    Review of Systems Review of Systems  Blood pressure 112/78, pulse 78, weight 128 lb 3.2 oz (58.2 kg), last menstrual period 09/04/2019.  Physical Exam Physical Exam Breathing, conversing, and ambulating normally Well nourished, well hydrated White female, no apparent distress Abd- benign Bimanual- Very tender with palpation, NSSA, mobile, no adnexal masses Data Reviewed Pap in 2017 normal  Assessment PID Preventative  Plan Cervical cultures and wet prep sent Gyn u/s ordered Treat with IM rocephin and oral doxy Pap smear done today     Xaidyn Kepner C Rylan Kaufmann 09/17/2019, 1:40 PM

## 2019-09-17 NOTE — Progress Notes (Signed)
Patient reports having some dysuria associated with lower back pain for a couple of days. U/A is normal today Last period was 09/04/2019

## 2019-09-18 LAB — URINE CULTURE

## 2019-09-19 ENCOUNTER — Other Ambulatory Visit: Payer: Self-pay | Admitting: Obstetrics & Gynecology

## 2019-09-19 LAB — CERVICOVAGINAL ANCILLARY ONLY
Bacterial Vaginitis (gardnerella): POSITIVE — AB
Candida Glabrata: NEGATIVE
Candida Vaginitis: NEGATIVE
Chlamydia: NEGATIVE
Comment: NEGATIVE
Comment: NEGATIVE
Comment: NEGATIVE
Comment: NEGATIVE
Comment: NEGATIVE
Comment: NORMAL
Neisseria Gonorrhea: NEGATIVE
Trichomonas: NEGATIVE

## 2019-09-19 MED ORDER — METRONIDAZOLE 500 MG PO TABS: 500.0000 mg | ORAL_TABLET | Freq: Two times a day (BID) | ORAL | 0 refills | Status: DC

## 2019-09-19 NOTE — Progress Notes (Signed)
Flagyl prescribed for bv seen on wet prep Mychart message sent.

## 2019-09-20 ENCOUNTER — Telehealth: Payer: Self-pay | Admitting: *Deleted

## 2019-09-20 MED ORDER — ONDANSETRON 4 MG PO TBDP: 4.0000 mg | ORAL_TABLET | Freq: Two times a day (BID) | ORAL | 0 refills | Status: DC

## 2019-09-20 NOTE — Telephone Encounter (Signed)
Pt called asking if she needed to continue the doxycylcine due to it making her sick and her cramping is much better. Pt also has been prescribed flagyl for BV. Told pt I would discuss with provider and call her back. Per Dr Vergie Living pt needs to contin the doxycycline and take the flagyl but that we will send in zofran for her to take 30 minutes prior to taking meds. Pt informed.

## 2019-09-21 LAB — CYTOLOGY - PAP
Comment: NEGATIVE
Diagnosis: NEGATIVE
Diagnosis: REACTIVE
High risk HPV: NEGATIVE

## 2019-09-25 ENCOUNTER — Ambulatory Visit
Admission: RE | Admit: 2019-09-25 | Discharge: 2019-09-25 | Disposition: A | Discharge status: Home / Self Care | Payer: 59 | Source: Ambulatory Visit | Attending: Obstetrics & Gynecology | Admitting: Obstetrics & Gynecology

## 2019-09-25 ENCOUNTER — Other Ambulatory Visit: Payer: Self-pay

## 2019-09-25 DIAGNOSIS — R102 Pelvic and perineal pain: Secondary | ICD-10-CM | POA: Diagnosis present

## 2019-10-02 ENCOUNTER — Other Ambulatory Visit: Payer: Self-pay

## 2019-10-02 ENCOUNTER — Ambulatory Visit (INDEPENDENT_AMBULATORY_CARE_PROVIDER_SITE_OTHER): Payer: 59 | Admitting: *Deleted

## 2019-10-02 VITALS — BP 105/73 | HR 64

## 2019-10-02 DIAGNOSIS — N898 Other specified noninflammatory disorders of vagina: Secondary | ICD-10-CM | POA: Diagnosis not present

## 2019-10-02 DIAGNOSIS — R102 Pelvic and perineal pain: Secondary | ICD-10-CM | POA: Diagnosis not present

## 2019-10-02 LAB — POCT URINALYSIS DIPSTICK
Blood, UA: NEGATIVE
Leukocytes, UA: NEGATIVE

## 2019-10-02 MED ORDER — FLUCONAZOLE 150 MG PO TABS: 150.0000 mg | ORAL_TABLET | Freq: Once | ORAL | 3 refills | Status: AC

## 2019-10-02 NOTE — Progress Notes (Signed)
SUBJECTIVE:  29 y.o. female complains of white vaginal discharge for 3 day(s). Pt thinks she has a yeast infection from the antibiotics she was on for BV and the pelvic infection.  Denies abnormal vaginal bleeding or significant pelvic pain or fever. No UTI symptoms. Denies history of known exposure to STD.  Patient's last menstrual period was 09/04/2019.  OBJECTIVE:  She appears well, afebrile. Urine dipstick: negative for all components.  ASSESSMENT:  Vaginal Discharge     PLAN:  BVAG, CVAG probe sent to lab. Treatment: To be determined once lab results are received ROV prn if symptoms persist or worsen.

## 2019-10-03 LAB — URINE CULTURE: Organism ID, Bacteria: NO GROWTH

## 2019-10-04 LAB — CERVICOVAGINAL ANCILLARY ONLY
Bacterial Vaginitis (gardnerella): NEGATIVE
Candida Glabrata: NEGATIVE
Candida Vaginitis: NEGATIVE
Comment: NEGATIVE
Comment: NEGATIVE
Comment: NEGATIVE

## 2019-10-05 NOTE — Progress Notes (Signed)
Patient seen and assessed by nursing staff during this encounter. I have reviewed the chart and agree with the documentation and plan.  Lamanda Rudder, MD 10/05/2019 9:14 AM   

## 2019-10-09 ENCOUNTER — Other Ambulatory Visit: Payer: Self-pay | Admitting: Internal Medicine

## 2019-10-31 ENCOUNTER — Other Ambulatory Visit: Payer: Self-pay

## 2019-10-31 ENCOUNTER — Ambulatory Visit (INDEPENDENT_AMBULATORY_CARE_PROVIDER_SITE_OTHER): Payer: 59 | Admitting: Family Medicine

## 2019-10-31 ENCOUNTER — Encounter: Payer: Self-pay | Admitting: Family Medicine

## 2019-10-31 VITALS — BP 104/66 | HR 70 | Temp 97.5°F | Ht 66.0 in | Wt 129.4 lb

## 2019-10-31 DIAGNOSIS — R300 Dysuria: Secondary | ICD-10-CM | POA: Diagnosis not present

## 2019-10-31 DIAGNOSIS — R102 Pelvic and perineal pain: Secondary | ICD-10-CM | POA: Diagnosis not present

## 2019-10-31 LAB — POCT WET PREP (WET MOUNT): Trichomonas Wet Prep HPF POC: ABSENT

## 2019-10-31 LAB — POC URINALSYSI DIPSTICK (AUTOMATED)
Bilirubin, UA: NEGATIVE
Blood, UA: NEGATIVE
Glucose, UA: NEGATIVE
Ketones, UA: 5
Leukocytes, UA: NEGATIVE
Nitrite, UA: NEGATIVE
Protein, UA: POSITIVE — AB
Spec Grav, UA: 1.025 (ref 1.010–1.025)
Urobilinogen, UA: 0.2 E.U./dL
pH, UA: 6.5 (ref 5.0–8.0)

## 2019-10-31 MED ORDER — FLUCONAZOLE 150 MG PO TABS: 150.0000 mg | ORAL_TABLET | Freq: Once | ORAL | 0 refills | Status: AC

## 2019-10-31 NOTE — Assessment & Plan Note (Signed)
ua with ketones and high nl SG and protein  Sent for culture  Noted yeast on wet prep-will tx this Also screen for gc and chlamydia  Reviewed recent gyn notes- similar c/o (was tx for BV and PID recently)

## 2019-10-31 NOTE — Assessment & Plan Note (Signed)
Recent PID-per pt much improved after tx with doxycycline from gyn/ also tx for BV Today fairly neg ua-cx pending  Also tx for yeast vaginitis Update if not starting to improve in a week or if worsening

## 2019-10-31 NOTE — Progress Notes (Signed)
Subjective:    Patient ID: Taylor Bautista, female    DOB: Apr 03, 1991, 29 y.o.   MRN: [id number]  This visit occurred during the SARS-CoV-2 public health emergency.  Safety protocols were in place, including screening questions prior to the visit, additional usage of staff PPE, and extensive cleaning of exam room while observing appropriate contact time as indicated for disinfecting solutions.    HPI  Pt presents with urinary complaints   Wt Readings from Last 3 Encounters:  10/31/19 129 lb 6 oz (58.7 kg)  09/17/19 128 lb 3.2 oz (58.2 kg)  01/19/19 146 lb 5 oz (66.4 kg)   20.88 kg/m   She started drinking Red Bull again - ? If bothering her bladder  Drinks a can per day  Some dysuria  Frequency of urination  No incontinence  Bladder is painful- sharp pain midline -similar to when she had the pelvic infection  No vaginal symptoms- no d/c or odor   ua showed a little protein and ketones  Drinks water "all the time"  1-2 bottles this am already  Has eaten breakfast also    Pt was tx for PID with IM rocephin and oral doxy by gyn  Also tx for BV with flagyl (? Boric acid) Pelvic US was nl  Neg pap 1/21 with neg HPV screen as well  Had neg Urine cx last month Has had BTL for contraception LMP 2/5  She had a new sexual partner 4 days ago  Did not use condoms   UA and wet prep today Results for orders placed or performed in visit on 10/31/19  POCT Urinalysis Dipstick (Automated)  Result Value Ref Range   Color, UA Yellow    Clarity, UA Clear    Glucose, UA Negative Negative   Bilirubin, UA Negative    Ketones, UA 5 mg/dL    Spec Grav, UA 8.546 1.010 - 1.025   Blood, UA Negative    pH, UA 6.5 5.0 - 8.0   Protein, UA Positive (A) Negative   Urobilinogen, UA 0.2 0.2 or 1.0 E.U./dL   Nitrite, UA Negative    Leukocytes, UA Negative Negative  POCT Wet Prep ([place] [place])  Result Value Ref Range   Source Wet Prep POC vaginal    WBC, Wet Prep HPF POC few    Bacteria Wet  Prep HPF POC Many (A) Few   BACTERIA WET PREP MORPHOLOGY POC     Clue Cells Wet Prep HPF POC None None   Clue Cells Wet Prep Whiff POC     Yeast Wet Prep HPF POC Moderate (A) None   KOH Wet Prep POC Moderate (A) None   Trichomonas Wet Prep HPF POC Absent Absent     Patient Active Problem List   Diagnosis Date Noted  . Dysuria 10/31/2019  . Pelvic pain 10/31/2019  . Episodic paroxysmal anxiety disorder 12/26/2014  . Migraine without aura 12/05/2012  . Allergic rhinitis 03/20/2007   Past Medical History:  Diagnosis Date  . Allergy   . Anxiety    no meds  . Dysmenorrhea   . Dysthymic disorder   . Exposure to STD   . MRSA infection   . Sleep apnea    pt states she does not use her CPAP   . Tobacco use disorder    Past Surgical History:  Procedure Laterality Date  . FOOT SURGERY  2016  . LAPAROSCOPIC TUBAL LIGATION Bilateral 08/02/2018   Procedure: LAPAROSCOPIC TUBAL LIGATION - Filshie Clips;  Surgeon:  Aletha Halim, MD;  Location: [facility];  Service: Gynecology;  Laterality: Bilateral;  . Pigmented nevus removal  04/1989   Social History   Tobacco Use  . Smoking status: Former Smoker    Types: Cigarettes    Quit date: 09/27/2018    Years since quitting: 1.0  . Smokeless tobacco: Never Used  Substance Use Topics  . Alcohol use: No    Alcohol/week: 1.0 standard drinks    Types: 1 Cans of beer per week  . 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 Cancer Mother 60       one breast  . Cancer Cousin 24       breast  . Anxiety disorder Other   . Cancer Sister 41       lymph node cancer twice  . Anxiety disorder Sister   . Depression Sister    No Known Allergies Current Outpatient Medications on File Prior to Visit  Medication Sig Dispense Refill  . cyanocobalamin (,VITAMIN B-12,) 1000 MCG/ML injection Inject 1 mL (1,000 mcg total) into the  muscle every 30 (thirty) days. Starting 09/04/2019 1 mL 3   No current facility-administered medications on file prior to visit.    Review of Systems  Constitutional: Negative for activity change, appetite change, fatigue, fever and unexpected weight change.  HENT: Negative for congestion, ear pain, rhinorrhea, sinus pressure and sore throat.   Eyes: Negative for pain, redness and visual disturbance.  Respiratory: Negative for cough, shortness of breath and wheezing.   Cardiovascular: Negative for chest pain and palpitations.  Gastrointestinal: Negative for abdominal pain, blood in stool, constipation and diarrhea.  Endocrine: Negative for polydipsia and polyuria.  Genitourinary: Positive for dysuria, pelvic pain and vaginal discharge. Negative for decreased urine volume, flank pain, frequency, genital sores, hematuria, urgency and vaginal pain.  Musculoskeletal: Negative for arthralgias, back pain and myalgias.  Skin: Negative for pallor and rash.  Allergic/Immunologic: Negative for environmental allergies.  Neurological: Negative for dizziness, syncope and headaches.  Hematological: Negative for adenopathy. Does not bruise/bleed easily.  Psychiatric/Behavioral: Negative for decreased concentration and dysphoric mood. The patient is not nervous/anxious.        Objective:   Physical Exam Constitutional:      General: She is not in acute distress.    Appearance: She is well-developed and normal weight. She is not ill-appearing or diaphoretic.  HENT:     Head: Normocephalic and atraumatic.     Mouth/Throat:     Mouth: Mucous membranes are moist.     Pharynx: Oropharynx is clear.  Eyes:     General:        Right eye: No discharge.        Left eye: No discharge.     Conjunctiva/sclera: Conjunctivae normal.     Pupils: Pupils are equal, round, and reactive to light.  Cardiovascular:     Rate and Rhythm: Normal rate and regular rhythm.     Heart sounds: Normal heart sounds.    Pulmonary:     Effort: Pulmonary effort is normal. No respiratory distress.     Breath sounds: Normal breath sounds. No wheezing or rales.  Genitourinary:    Comments: Nl appearing ext genitalia and vaginal mucosa Scant pale d/c w/o odor  Nl appearing cervix and no CMT No suprapubic tenderness   Wet prep obt  Gc/chlamydia probe obt Musculoskeletal:  Cervical back: Normal range of motion.  Lymphadenopathy:     Cervical: No cervical adenopathy.  Skin:    General: Skin is warm and dry.     Coloration: Skin is not pale.     Findings: No erythema or rash.  Neurological:     Mental Status: She is alert. Mental status is at baseline.  Psychiatric:        Mood and Affect: Mood normal.           Assessment & Plan:   Problem List Items Addressed This Visit      Other   Dysuria - Primary    ua with ketones and high nl SG and protein  Sent for culture  Noted yeast on wet prep-will tx this Also screen for gc and chlamydia  Reviewed recent gyn notes- similar c/o (was tx for BV and PID recently)      Relevant Orders   POCT Urinalysis Dipstick (Automated) (Completed)   Urine Culture   POCT Wet Prep [facility]) (Completed)   Pelvic pain    Recent PID-per pt much improved after tx with doxycycline from gyn/ also tx for BV Today fairly neg ua-cx pending  Also tx for yeast vaginitis Update if not starting to improve in a week or if worsening        Relevant Orders   POCT Wet Prep [facility]) (Completed)   C. trachomatis/N. gonorrhoeae RNA

## 2019-10-31 NOTE — Patient Instructions (Addendum)
Drink more water if you can to keep urine dilute  Stop any drinks like red bull   We sent your urine for a culture and will get back to you with results  Also a gonorrhea/chlamydia test- will call you with results   You do have some yeast vaginitis Take diflucan as directed   Let us know if symptoms do not improve

## 2019-11-01 LAB — C. TRACHOMATIS/N. GONORRHOEAE RNA
C. trachomatis RNA, TMA: NOT DETECTED
N. gonorrhoeae RNA, TMA: NOT DETECTED

## 2019-11-01 LAB — URINE CULTURE
MICRO NUMBER:: 10159647
SPECIMEN QUALITY:: ADEQUATE

## 2019-11-05 ENCOUNTER — Telehealth: Payer: Self-pay

## 2019-11-05 MED ORDER — FLUCONAZOLE 150 MG PO TABS: 150.0000 mg | ORAL_TABLET | Freq: Once | ORAL | 0 refills | Status: AC

## 2019-11-05 NOTE — Telephone Encounter (Signed)
Pt notified of Dr. Royden Purl comments and 2nd dose of diflucan sent to [facility] per pt request

## 2019-11-05 NOTE — Telephone Encounter (Signed)
Patient called stating her symptoms have not resolved 100% since seen Dr Milinda Antis on 10/31/19. She thought she was doing better but still not 100%. She is still having some discharge, soreness in the vaginal area and discomfort in that area. No new symptoms. She took medication last time on 10/31/19

## 2019-11-05 NOTE — Telephone Encounter (Signed)
I pended another diflucan to send to pharmacy of choice If no further improvement I recommend f/u with her gyn

## 2019-11-09 ENCOUNTER — Other Ambulatory Visit: Payer: Self-pay | Admitting: Internal Medicine

## 2019-11-21 ENCOUNTER — Other Ambulatory Visit: Payer: Self-pay | Admitting: Internal Medicine

## 2019-11-27 ENCOUNTER — Encounter: Payer: Self-pay | Admitting: Internal Medicine

## 2019-11-27 ENCOUNTER — Ambulatory Visit (INDEPENDENT_AMBULATORY_CARE_PROVIDER_SITE_OTHER): Payer: 59 | Admitting: Internal Medicine

## 2019-11-27 ENCOUNTER — Other Ambulatory Visit: Payer: Self-pay

## 2019-11-27 VITALS — BP 106/70 | HR 88 | Temp 98.0°F | Wt 130.0 lb

## 2019-11-27 DIAGNOSIS — N898 Other specified noninflammatory disorders of vagina: Secondary | ICD-10-CM

## 2019-11-27 MED ORDER — FLUCONAZOLE 150 MG PO TABS: 150.0000 mg | ORAL_TABLET | Freq: Once | ORAL | 0 refills | Status: AC

## 2019-11-27 NOTE — Progress Notes (Signed)
Subjective:    Patient ID: Taylor Bautista, female    DOB: Feb 03, 1991, 29 y.o.   MRN: [id number]  HPI  Pt presents to the clinic today with c/o vaginal discharge.  This started 3 days ago.  The discharge is thin, white/gray in color.  She denies any itching, burning or odor.  She denies urinary urgency, frequency, dysuria or blood in her urine.  She has not taken any medications over-the-counter.  She reports she has a new sexual partner for the last 3 months.   Review of Systems      Past Medical History:  Diagnosis Date  . Allergy   . Anxiety    no meds  . Dysmenorrhea   . Dysthymic disorder   . Exposure to STD   . MRSA infection   . Sleep apnea    pt states she does not use her CPAP   . Tobacco use disorder     Current Outpatient Medications  Medication Sig Dispense Refill  . cyanocobalamin (,VITAMIN B-12,) 1000 MCG/ML injection Inject 1 mL (1,000 mcg total) into the muscle every 30 (thirty) days. Starting 09/04/2019 1 mL 3  . fluticasone (FLONASE) 50 MCG/ACT nasal spray SPRAY 2 SPRAYS INTO EACH NOSTRIL EVERY DAY 48 mL 1   No current facility-administered medications for this visit.    No Known Allergies  Family History  Problem Relation Age of Onset  . Cancer Mother 41       one breast  . Cancer Cousin 24       breast  . Anxiety disorder Other   . Cancer Sister 43       lymph node cancer twice  . Anxiety disorder Sister   . Depression Sister     Social History   Socioeconomic History  . Marital status: Divorced    Spouse name: Not on file  . Number of children: Not on file  . Years of education: Not on file  . Highest education level: Not on file  Occupational History  . Not on file  Tobacco Use  . Smoking status: Former Smoker    Types: Cigarettes    Quit date: 09/27/2018    Years since quitting: 1.1  . Smokeless tobacco: Never Used  Substance and Sexual Activity  . Alcohol use: No    Alcohol/week: 1.0 standard drinks    Types: 1 Cans of  beer per week  . 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 Sexual activity: Not Currently    Partners: Male    Birth control/protection: I.U.D.  Other Topics Concern  . Not on file  Social History Narrative   Lives with mother      Works at [employer]         Social Determinants of Health   Financial Resource Strain:   . Difficulty of Paying Living Expenses:   Food Insecurity:   . Worried About Programme researcher, broadcasting/film/video in the Last Year:   . Barista in the Last Year:   Transportation Needs:   . Freight forwarder (Medical):   Marland Kitchen Lack of Transportation (Non-Medical):   Physical Activity:   . Days of Exercise per Week:   . Minutes of Exercise per Session:   Stress:   . Feeling of Stress :   Social Connections:   . Frequency of Communication with Friends and Family:   . Frequency of Social Gatherings with Friends  and Family:   . Attends Religious Services:   . Active Member of Clubs or Organizations:   . Attends Archivist Meetings:   Marland Kitchen Marital Status:   Intimate Partner Violence:   . Fear of Current or Ex-Partner:   . Emotionally Abused:   Marland Kitchen Physically Abused:   . Sexually Abused:      Constitutional: Denies fever, malaise, fatigue, headache or abrupt weight changes.  Respiratory: Denies difficulty breathing, shortness of breath, cough or sputum production.   Cardiovascular: Denies chest pain, chest tightness, palpitations or swelling in the hands or feet.  Gastrointestinal: Denies abdominal pain, bloating, constipation, diarrhea or blood in the stool.  GU: Patient reports vaginal discharge.  Denies urgency, frequency, pain with urination, burning sensation, blood in urine, odor.  No other specific complaints in a complete review of systems (except as listed in HPI above).  Objective:   Physical Exam   BP 106/70   Pulse 88   Temp 98 F (36.7 C) (Temporal)   Wt 130 lb (59 kg)   LMP  11/06/2019   SpO2 98%   BMI 20.98 kg/m   Wt Readings from Last 3 Encounters:  10/31/19 129 lb 6 oz (58.7 kg)  09/17/19 128 lb 3.2 oz (58.2 kg)  01/19/19 146 lb 5 oz (66.4 kg)    General: Appears her stated age, well developed, well nourished in NAD. Skin: Warm, dry and intact. No rashes, lesions or ulcerations noted. Cardiovascular: Normal rate and rhythm. S1,S2 noted.  No murmur, rubs or gallops noted.  Pulmonary/Chest: Normal effort and positive vesicular breath sounds. No respiratory distress. No wheezes, rales or ronchi noted.  Abdomen: Soft and nontender. Normal bowel sounds. No distention or masses noted.  Pelvic: Normal female anatomy.  No vaginal discharge or odor noted. Neurological: Alert and oriented.  BMET    Component Value Date/Time   NA 140 02/01/2018 1453   K 3.7 02/01/2018 1453   CL 106 02/01/2018 1453   CO2 28 02/01/2018 1453   GLUCOSE 73 02/01/2018 1453   BUN 9 02/01/2018 1453   CREATININE 0.72 02/01/2018 1453   CALCIUM 9.4 02/01/2018 1453   GFRNONAA >60 04/28/2015 2305   GFRAA >60 04/28/2015 2305    Lipid Panel     Component Value Date/Time   CHOL 162 02/01/2018 1453   TRIG 124.0 02/01/2018 1453   HDL 48.90 02/01/2018 1453   CHOLHDL 3 02/01/2018 1453   VLDL 24.8 02/01/2018 1453   LDLCALC 88 02/01/2018 1453    CBC    Component Value Date/Time   WBC 5.9 02/01/2018 1453   RBC 4.41 02/01/2018 1453   HGB 13.9 02/01/2018 1453   HCT 40.1 02/01/2018 1453   PLT 259.0 02/01/2018 1453   MCV 91.0 02/01/2018 1453   MCH 31.0 04/28/2015 2305   MCHC 34.6 02/01/2018 1453   RDW 12.6 02/01/2018 1453   LYMPHSABS 1.6 09/23/2017 1259   MONOABS 0.5 09/23/2017 1259   EOSABS 0.3 09/23/2017 1259   BASOSABS 0.0 09/23/2017 1259    Hgb A1C No results found for: HGBA1C         Assessment & Plan:  Vaginal Discharge:  We will send off wet prep Rx for Diflucan 150 mg p.o. x1  We will follow-up after labs, return precautions discussed Webb Silversmith,  NP This visit occurred during the SARS-CoV-2 public health emergency.  Safety protocols were in place, including screening questions prior to the visit, additional usage of staff PPE, and extensive cleaning of exam room while  observing appropriate contact time as indicated for disinfecting solutions.

## 2019-11-28 ENCOUNTER — Encounter: Payer: Self-pay | Admitting: Internal Medicine

## 2019-11-28 LAB — WET PREP BY MOLECULAR PROBE
Candida species: NOT DETECTED
Gardnerella vaginalis: NOT DETECTED
MICRO NUMBER:: 10256861
SPECIMEN QUALITY:: ADEQUATE
Trichomonas vaginosis: NOT DETECTED

## 2019-11-28 NOTE — Patient Instructions (Signed)

## 2019-11-29 ENCOUNTER — Ambulatory Visit (INDEPENDENT_AMBULATORY_CARE_PROVIDER_SITE_OTHER): Payer: 59 | Admitting: Internal Medicine

## 2019-11-29 ENCOUNTER — Other Ambulatory Visit: Payer: Self-pay

## 2019-11-29 ENCOUNTER — Encounter: Payer: Self-pay | Admitting: Internal Medicine

## 2019-11-29 VITALS — BP 108/78 | HR 91 | Temp 98.7°F | Wt 129.0 lb

## 2019-11-29 DIAGNOSIS — N898 Other specified noninflammatory disorders of vagina: Secondary | ICD-10-CM

## 2019-11-29 NOTE — Addendum Note (Signed)
Addended by: Roena Malady on: 11/29/2019 03:31 PM   Modules accepted: Orders

## 2019-11-29 NOTE — Patient Instructions (Signed)

## 2019-11-29 NOTE — Progress Notes (Signed)
Subjective:    Patient ID: Taylor Bautista, female    DOB: [birth year], 29 y.o.   MRN: [id number]  HPI  Pt presents to the clinic today with c/o vaginal discharge. This started 4 days ago. The discharge is white/grey in color. She denies itching, burning or odor. She denies urinary issues or abnormal uterine bleeding. She was seen yesterday. Wet prep was negative. She was given Diflucan 150 mg PO x 1 and has not noticed any changes in her symptoms. She reports a new sexual partner for the last 3 months.   Review of Systems      Past Medical History:  Diagnosis Date  . Allergy   . Anxiety    no meds  . Dysmenorrhea   . Dysthymic disorder   . Exposure to STD   . MRSA infection   . Sleep apnea    pt states she does not use her CPAP   . Tobacco use disorder     Current Outpatient Medications  Medication Sig Dispense Refill  . cyanocobalamin (,VITAMIN B-12,) 1000 MCG/ML injection Inject 1 mL (1,000 mcg total) into the muscle every 30 (thirty) days. Starting 09/04/2019 1 mL 3  . fluticasone (FLONASE) 50 MCG/ACT nasal spray SPRAY 2 SPRAYS INTO EACH NOSTRIL EVERY DAY 48 mL 1   No current facility-administered medications for this visit.    No Known Allergies  Family History  Problem Relation Age of Onset  . Cancer Mother 53       one breast  . Cancer Cousin 24       breast  . Anxiety disorder Other   . Cancer Sister 91       lymph node cancer twice  . Anxiety disorder Sister   . Depression Sister     Social History   Socioeconomic History  . Marital status: Divorced    Spouse name: Not on file  . Number of children: Not on file  . Years of education: Not on file  . Highest education level: Not on file  Occupational History  . Not on file  Tobacco Use  . Smoking status: Former Smoker    Types: Cigarettes    Quit date: 09/27/2018    Years since quitting: 1.1  . Smokeless tobacco: Never Used  Substance and Sexual Activity  . Alcohol use: No    Alcohol/week:  1.0 standard drinks    Types: 1 Cans of beer per week  . 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 Sexual activity: Not Currently    Partners: Male    Birth control/protection: I.U.D.  Other Topics Concern  . Not on file  Social History Narrative   Lives with mother      Works at [employer] Strain:   . Difficulty of Paying Living Expenses:   Food Insecurity:   . Worried About Charity fundraiser in the Last Year:   . Arboriculturist in the Last Year:   Transportation Needs:   . Film/video editor (Medical):   Marland Kitchen Lack of Transportation (Non-Medical):   Physical Activity:   . Days of Exercise per Week:   . Minutes of Exercise per Session:   Stress:   . Feeling of Stress :   Social Connections:   . Frequency of Communication with Friends and Family:   .  Frequency of Social Gatherings with Friends and Family:   . Attends Religious Services:   . Active Member of Clubs or Organizations:   . Attends Banker Meetings:   Marland Kitchen Marital Status:   Intimate Partner Violence:   . Fear of Current or Ex-Partner:   . Emotionally Abused:   Marland Kitchen Physically Abused:   . Sexually Abused:      Constitutional: Denies fever, malaise, fatigue, headache or abrupt weight changes.  Respiratory: Denies difficulty breathing, shortness of breath, cough or sputum production.   Cardiovascular: Denies chest pain, chest tightness, palpitations or swelling in the hands or feet.  Gastrointestinal: Denies abdominal pain, bloating, constipation, diarrhea or blood in the stool.  GU: Pt reports vaginal discharge. Denies urgency, frequency, pain with urination, burning sensation, blood in urine, odor.   No other specific complaints in a complete review of systems (except as listed in HPI above).  Objective:   Physical Exam   BP 108/78   Pulse 91   Temp 98.7 F (37.1 C)  (Temporal)   Wt 129 lb (58.5 kg)   LMP 11/06/2019   SpO2 98%   BMI 20.82 kg/m   Wt Readings from Last 3 Encounters:  11/27/19 130 lb (59 kg)  10/31/19 129 lb 6 oz (58.7 kg)  09/17/19 128 lb 3.2 oz (58.2 kg)    General: Appears her stated age, well developed, well nourished in NAD. Cardiovascular: Normal rate and rhythm.  Pulmonary/Chest: Normal effort and positive vesicular breath sounds.  Pelvic: Deferred, done yesterday. Neurological: Alert and oriented.   BMET    Component Value Date/Time   NA 140 02/01/2018 1453   K 3.7 02/01/2018 1453   CL 106 02/01/2018 1453   CO2 28 02/01/2018 1453   GLUCOSE 73 02/01/2018 1453   BUN 9 02/01/2018 1453   CREATININE 0.72 02/01/2018 1453   CALCIUM 9.4 02/01/2018 1453   GFRNONAA >60 04/28/2015 2305   GFRAA >60 04/28/2015 2305    Lipid Panel     Component Value Date/Time   CHOL 162 02/01/2018 1453   TRIG 124.0 02/01/2018 1453   HDL 48.90 02/01/2018 1453   CHOLHDL 3 02/01/2018 1453   VLDL 24.8 02/01/2018 1453   LDLCALC 88 02/01/2018 1453    CBC    Component Value Date/Time   WBC 5.9 02/01/2018 1453   RBC 4.41 02/01/2018 1453   HGB 13.9 02/01/2018 1453   HCT 40.1 02/01/2018 1453   PLT 259.0 02/01/2018 1453   MCV 91.0 02/01/2018 1453   MCH 31.0 04/28/2015 2305   MCHC 34.6 02/01/2018 1453   RDW 12.6 02/01/2018 1453   LYMPHSABS 1.6 09/23/2017 1259   MONOABS 0.5 09/23/2017 1259   EOSABS 0.3 09/23/2017 1259   BASOSABS 0.0 09/23/2017 1259    Hgb A1C No results found for: HGBA1C         Assessment & Plan:   Vaginal Discharge:  Will check urine gonorrhea/chlamydia  Will follow up after labs are back.  Nicki Reaper, NP This visit occurred during the SARS-CoV-2 public health emergency.  Safety protocols were in place, including screening questions prior to the visit, additional usage of staff PPE, and extensive cleaning of exam room while observing appropriate contact time as indicated for disinfecting solutions.

## 2019-11-30 LAB — C. TRACHOMATIS/N. GONORRHOEAE RNA
C. trachomatis RNA, TMA: NOT DETECTED
N. gonorrhoeae RNA, TMA: NOT DETECTED

## 2019-12-11 ENCOUNTER — Ambulatory Visit: Payer: 59 | Admitting: Internal Medicine

## 2019-12-11 ENCOUNTER — Other Ambulatory Visit: Payer: Self-pay

## 2019-12-11 ENCOUNTER — Ambulatory Visit (INDEPENDENT_AMBULATORY_CARE_PROVIDER_SITE_OTHER): Payer: 59 | Admitting: Internal Medicine

## 2019-12-11 ENCOUNTER — Encounter: Payer: Self-pay | Admitting: Internal Medicine

## 2019-12-11 VITALS — BP 108/70 | HR 88 | Temp 98.4°F | Wt 129.0 lb

## 2019-12-11 DIAGNOSIS — N898 Other specified noninflammatory disorders of vagina: Secondary | ICD-10-CM | POA: Diagnosis not present

## 2019-12-11 LAB — POC URINALSYSI DIPSTICK (AUTOMATED)
Bilirubin, UA: NEGATIVE
Blood, UA: NEGATIVE
Glucose, UA: NEGATIVE
Ketones, UA: NEGATIVE
Leukocytes, UA: NEGATIVE
Nitrite, UA: NEGATIVE
Protein, UA: NEGATIVE
Spec Grav, UA: 1.015 (ref 1.010–1.025)
Urobilinogen, UA: 0.2 E.U./dL
pH, UA: 7 (ref 5.0–8.0)

## 2019-12-11 NOTE — Patient Instructions (Signed)

## 2019-12-11 NOTE — Progress Notes (Signed)
Subjective:    Patient ID: Taylor Bautista, female    DOB: [birth year], 29 y.o.   MRN: [id number]  HPI  Pt presents to the clinic today with c/o vaginal irritation. She reports this has been an ongoing issue for about 6 weeks. She had a normal wet prep 3/16, negative STD screen 3/18. She reports something just does not feel right. She denies vaginal discharge, odor or abnormal bleeding. She denies urinary urgency, frequency, dysuria or blood in her urine. She denies fever, chills, nausea or low back pain. She does take a lot of baths, usually after tanning. She is not sure if this is related. She has a recent new sexual partner but denies sexual intercourse since her last appt.  Review of Systems      Past Medical History:  Diagnosis Date  . Allergy   . Anxiety    no meds  . Dysmenorrhea   . Dysthymic disorder   . Exposure to STD   . MRSA infection   . Sleep apnea    pt states she does not use her CPAP   . Tobacco use disorder     Current Outpatient Medications  Medication Sig Dispense Refill  . cyanocobalamin (,VITAMIN B-12,) 1000 MCG/ML injection Inject 1 mL (1,000 mcg total) into the muscle every 30 (thirty) days. Starting 09/04/2019 1 mL 3  . fluticasone (FLONASE) 50 MCG/ACT nasal spray SPRAY 2 SPRAYS INTO EACH NOSTRIL EVERY DAY 48 mL 1   No current facility-administered medications for this visit.    No Known Allergies  Family History  Problem Relation Age of Onset  . Cancer Mother 71       one breast  . Cancer Cousin 24       breast  . Anxiety disorder Other   . Cancer Sister 27       lymph node cancer twice  . Anxiety disorder Sister   . Depression Sister     Social History   Socioeconomic History  . Marital status: Divorced    Spouse name: Not on file  . Number of children: Not on file  . Years of education: Not on file  . Highest education level: Not on file  Occupational History  . Not on file  Tobacco Use  . Smoking status: Former Smoker   Types: Cigarettes    Quit date: 09/27/2018    Years since quitting: 1.2  . Smokeless tobacco: Never Used  Substance and Sexual Activity  . Alcohol use: No    Alcohol/week: 1.0 standard drinks    Types: 1 Cans of beer per week  . 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 Sexual activity: Not Currently    Partners: Male    Birth control/protection: I.U.D.  Other Topics Concern  . Not on file  Social History Narrative   Lives with mother      Works at [employer]         Social Determinants of Health   Financial Resource Strain:   . Difficulty of Paying Living Expenses:   Food Insecurity:   . Worried About Programme researcher, broadcasting/film/video in the Last Year:   . Barista in the Last Year:   Transportation Needs:   . Freight forwarder (Medical):   Marland Kitchen Lack of Transportation (Non-Medical):   Physical Activity:   . Days of Exercise per Week:   . Minutes of Exercise per Session:  Stress:   . Feeling of Stress :   Social Connections:   . Frequency of Communication with Friends and Family:   . Frequency of Social Gatherings with Friends and Family:   . Attends Religious Services:   . Active Member of Clubs or Organizations:   . Attends Archivist Meetings:   Marland Kitchen Marital Status:   Intimate Partner Violence:   . Fear of Current or Ex-Partner:   . Emotionally Abused:   Marland Kitchen Physically Abused:   . Sexually Abused:      Constitutional: Denies fever, malaise, fatigue, headache or abrupt weight changes.  Respiratory: Denies difficulty breathing, shortness of breath, cough or sputum production.   Cardiovascular: Denies chest pain, chest tightness, palpitations or swelling in the hands or feet.  Gastrointestinal: Denies abdominal pain, bloating, constipation, diarrhea or blood in the stool.  GU: Pt reports vaginal irritation. Denies urgency, frequency, pain with urination, burning sensation, blood in urine, odor or  discharge. Skin: Denies redness, rashes, lesions or ulcercations.   No other specific complaints in a complete review of systems (except as listed in HPI above).  Objective:   Physical Exam  BP 108/70   Pulse 88   Temp 98.4 F (36.9 C) (Temporal)   Wt 129 lb (58.5 kg)   LMP 12/04/2019   SpO2 98%   BMI 20.82 kg/m   Wt Readings from Last 3 Encounters:  12/11/19 129 lb (58.5 kg)  11/29/19 129 lb (58.5 kg)  11/27/19 130 lb (59 kg)    General: Appears her stated age, well developed, well nourished in NAD. Skin: Warm, dry and intact. No rashes, lesions or ulcerations noted. Cardiovascular: Normal rate and rhythm.  Pulmonary/Chest: Normal effort Pelvic:  Normal female anatomy. No external lesion or ulceration noted. No vaginal discharge or odor noted. Neurological: Alert and oriented.    BMET    Component Value Date/Time   NA 140 02/01/2018 1453   K 3.7 02/01/2018 1453   CL 106 02/01/2018 1453   CO2 28 02/01/2018 1453   GLUCOSE 73 02/01/2018 1453   BUN 9 02/01/2018 1453   CREATININE 0.72 02/01/2018 1453   CALCIUM 9.4 02/01/2018 1453   GFRNONAA >60 04/28/2015 2305   GFRAA >60 04/28/2015 2305    Lipid Panel     Component Value Date/Time   CHOL 162 02/01/2018 1453   TRIG 124.0 02/01/2018 1453   HDL 48.90 02/01/2018 1453   CHOLHDL 3 02/01/2018 1453   VLDL 24.8 02/01/2018 1453   LDLCALC 88 02/01/2018 1453    CBC    Component Value Date/Time   WBC 5.9 02/01/2018 1453   RBC 4.41 02/01/2018 1453   HGB 13.9 02/01/2018 1453   HCT 40.1 02/01/2018 1453   PLT 259.0 02/01/2018 1453   MCV 91.0 02/01/2018 1453   MCH 31.0 04/28/2015 2305   MCHC 34.6 02/01/2018 1453   RDW 12.6 02/01/2018 1453   LYMPHSABS 1.6 09/23/2017 1259   MONOABS 0.5 09/23/2017 1259   EOSABS 0.3 09/23/2017 1259   BASOSABS 0.0 09/23/2017 1259    Hgb A1C No results found for: HGBA1C          Assessment & Plan:   Vaginal Irritation:  Urinalysis: normal Will send urine culture Will  send off wet prep Avoid baths for now If symptoms persist, consider referral to GYN  Will follow up after labs, return precautions discussed Webb Silversmith, NP This visit occurred during the SARS-CoV-2 public health emergency.  Safety protocols were in place, including screening questions prior  to the visit, additional usage of staff PPE, and extensive cleaning of exam room while observing appropriate contact time as indicated for disinfecting solutions.

## 2019-12-12 LAB — WET PREP BY MOLECULAR PROBE
Candida species: DETECTED — AB
Gardnerella vaginalis: NOT DETECTED
MICRO NUMBER:: 10307702
SPECIMEN QUALITY:: ADEQUATE
Trichomonas vaginosis: NOT DETECTED

## 2019-12-12 MED ORDER — FLUCONAZOLE 150 MG PO TABS: 150.0000 mg | ORAL_TABLET | Freq: Once | ORAL | 0 refills | Status: AC

## 2019-12-12 NOTE — Addendum Note (Signed)
Addended by: Lorre Munroe on: 12/12/2019 01:09 PM   Modules accepted: Orders

## 2019-12-13 LAB — URINE CULTURE
MICRO NUMBER:: 10307700
Result:: NO GROWTH
SPECIMEN QUALITY:: ADEQUATE

## 2019-12-19 ENCOUNTER — Other Ambulatory Visit: Payer: Self-pay | Admitting: Internal Medicine

## 2019-12-20 ENCOUNTER — Other Ambulatory Visit: Payer: Self-pay | Admitting: Internal Medicine

## 2019-12-20 DIAGNOSIS — E538 Deficiency of other specified B group vitamins: Secondary | ICD-10-CM | POA: Insufficient documentation

## 2019-12-20 NOTE — Progress Notes (Signed)
b

## 2020-01-04 ENCOUNTER — Other Ambulatory Visit: Payer: Self-pay

## 2020-01-04 ENCOUNTER — Other Ambulatory Visit (INDEPENDENT_AMBULATORY_CARE_PROVIDER_SITE_OTHER): Payer: 59

## 2020-01-04 DIAGNOSIS — E538 Deficiency of other specified B group vitamins: Secondary | ICD-10-CM

## 2020-01-05 LAB — VITAMIN B12: Vitamin B-12: 498 pg/mL (ref 200–1100)

## 2020-01-09 ENCOUNTER — Other Ambulatory Visit: Payer: Self-pay | Admitting: Internal Medicine

## 2020-01-30 ENCOUNTER — Ambulatory Visit (INDEPENDENT_AMBULATORY_CARE_PROVIDER_SITE_OTHER): Payer: 59 | Admitting: *Deleted

## 2020-01-30 ENCOUNTER — Other Ambulatory Visit (HOSPITAL_COMMUNITY)
Admission: RE | Admit: 2020-01-30 | Discharge: 2020-01-30 | Disposition: A | Discharge status: Home / Self Care | Payer: 59 | Source: Ambulatory Visit | Attending: Family Medicine | Admitting: Family Medicine

## 2020-01-30 ENCOUNTER — Other Ambulatory Visit: Payer: Self-pay

## 2020-01-30 VITALS — BP 105/72 | HR 66

## 2020-01-30 DIAGNOSIS — R300 Dysuria: Secondary | ICD-10-CM | POA: Insufficient documentation

## 2020-01-30 LAB — POCT URINALYSIS DIPSTICK: Blood, UA: NEGATIVE

## 2020-01-30 MED ORDER — NITROFURANTOIN MONOHYD MACRO 100 MG PO CAPS: 100.0000 mg | ORAL_CAPSULE | Freq: Two times a day (BID) | ORAL | 0 refills | Status: DC

## 2020-01-30 MED ORDER — PHENAZOPYRIDINE HCL 200 MG PO TABS: 200.0000 mg | ORAL_TABLET | Freq: Three times a day (TID) | ORAL | 1 refills | Status: DC | PRN

## 2020-01-30 NOTE — Progress Notes (Signed)
Attestation of Attending Supervision of clinical support staff: I agree with the care provided to this patient and was available for any consultation.  I have reviewed the RN's note and chart. I was available for consult and to see the patient if needed. I recommended treatment today given sx and positive leukocytes on UA  Federico Flake, MD, MPH, ABFM Attending Physician Faculty [facility]

## 2020-01-30 NOTE — Progress Notes (Signed)
SUBJECTIVE: Taylor Bautista is a 29 y.o. female who complains of urinary frequency, urgency and dysuria x few days, without flank pain, fever, chills, or abnormal vaginal discharge or bleeding.   OBJECTIVE: Appears well, in no apparent distress.  Vital signs are normal. Urine dipstick shows positive for leukocytes.    ASSESSMENT: Dysuria  PLAN: Treatment per orders. Also sent in swab for CG, Chlamydia, CVAG and BVAG.  Call or return to clinic prn if these symptoms worsen or fail to improve as anticipated.

## 2020-01-31 LAB — URINE CULTURE

## 2020-02-01 LAB — CERVICOVAGINAL ANCILLARY ONLY
Bacterial Vaginitis (gardnerella): NEGATIVE
Candida Glabrata: NEGATIVE
Candida Vaginitis: NEGATIVE
Chlamydia: NEGATIVE
Comment: NEGATIVE
Comment: NEGATIVE
Comment: NEGATIVE
Comment: NEGATIVE
Comment: NEGATIVE
Comment: NORMAL
Neisseria Gonorrhea: NEGATIVE
Trichomonas: NEGATIVE

## 2020-02-04 ENCOUNTER — Telehealth: Payer: Self-pay | Admitting: *Deleted

## 2020-02-04 NOTE — Telephone Encounter (Signed)
Pt informed of negative results and can stop her macrobid.

## 2020-02-04 NOTE — Telephone Encounter (Signed)
-----   Message from Federico Flake, MD sent at 02/04/2020  9:26 AM EDT ----- Urine Culture negative. Patient can stop macrobid.

## 2020-02-13 ENCOUNTER — Encounter (HOSPITAL_COMMUNITY): Payer: Self-pay

## 2020-02-13 ENCOUNTER — Telehealth: Payer: Self-pay | Admitting: Internal Medicine

## 2020-02-13 ENCOUNTER — Ambulatory Visit (HOSPITAL_COMMUNITY)
Admission: EM | Admit: 2020-02-13 | Discharge: 2020-02-13 | Disposition: A | Discharge status: Home / Self Care | Payer: 59 | Attending: Family Medicine | Admitting: Family Medicine

## 2020-02-13 ENCOUNTER — Ambulatory Visit (INDEPENDENT_AMBULATORY_CARE_PROVIDER_SITE_OTHER): Payer: 59

## 2020-02-13 DIAGNOSIS — M25562 Pain in left knee: Secondary | ICD-10-CM

## 2020-02-13 DIAGNOSIS — S82141A Displaced bicondylar fracture of right tibia, initial encounter for closed fracture: Secondary | ICD-10-CM | POA: Diagnosis not present

## 2020-02-13 DIAGNOSIS — S8992XA Unspecified injury of left lower leg, initial encounter: Secondary | ICD-10-CM | POA: Diagnosis not present

## 2020-02-13 DIAGNOSIS — S82142A Displaced bicondylar fracture of left tibia, initial encounter for closed fracture: Secondary | ICD-10-CM

## 2020-02-13 MED ORDER — IBUPROFEN 600 MG PO TABS
600.0000 mg | ORAL_TABLET | Freq: Three times a day (TID) | ORAL | 0 refills | Status: DC | PRN
Start: 2020-02-13 — End: 2020-06-30

## 2020-02-13 NOTE — Telephone Encounter (Signed)
Yes, will need hospital followup

## 2020-02-13 NOTE — Telephone Encounter (Signed)
Patient was seen in the hospital this morning for an injury.,  She stated that she ended up fracturing her left knee. The hospital advised her to call the office to have her PCP sent a referral for her to see an orthopedic doctor.    Will patient need hospital f/u for this before it is done?

## 2020-02-13 NOTE — ED Provider Notes (Signed)
[facility]    CSN: [id number] Arrival date & time: 02/13/20  1020      History   Chief Complaint Chief Complaint  Patient presents with  . Knee Injury    HPI Taylor Bautista is a 29 y.o. female.   Pt overall healthy 29 year old female presenting with left knee pain beginning after falling down a couple of stairs last night. Reports posterior, lateral, and medial left knee pain. Pain worse with weightbearing and ambulation. Ice and Aleve provided minimal relief of symptoms. Denies numbness, tingling, decreased sensation.   ROS per HPI      Past Medical History:  Diagnosis Date  . Allergy   . Anxiety    no meds  . Dysmenorrhea   . Dysthymic disorder   . Exposure to STD   . MRSA infection   . Sleep apnea    pt states she does not use her CPAP   . Tobacco use disorder     Patient Active Problem List   Diagnosis Date Noted  . B12 deficiency 12/20/2019  . Episodic paroxysmal anxiety disorder 12/26/2014  . Migraine without aura 12/05/2012  . Allergic rhinitis 03/20/2007    Past Surgical History:  Procedure Laterality Date  . FOOT SURGERY  2016  . LAPAROSCOPIC TUBAL LIGATION Bilateral 08/02/2018   Procedure: LAPAROSCOPIC TUBAL LIGATION - Filshie Clips;  Surgeon: Bancroft Bing, MD;  Location: [facility];  Service: Gynecology;  Laterality: Bilateral;  . Pigmented nevus removal  04/1989    OB History    Gravida  3   Para  2   Term  1   Preterm  1   AB  1   Living  2     SAB  0   TAB  1   Ectopic  0   Multiple  0   Live Births  2            Home Medications    Prior to Admission medications   Medication Sig Start Date End Date Taking? Authorizing Provider  cyanocobalamin (,VITAMIN B-12,) 1000 MCG/ML injection INJECT 1 ML (1,000 MCG TOTAL) INTO THE MUSCLE EVERY 30 (THIRTY) DAYS. STARTING 09/04/2019 01/18/20 05/18/20  Lorre Munroe, NP  ibuprofen (ADVIL) 600 MG tablet Take 1 tablet (600 mg total) by mouth every  8 (eight) hours as needed for moderate pain. 02/13/20   Janace Aris, NP  fluticasone (FLONASE) 50 MCG/ACT nasal spray SPRAY 2 SPRAYS INTO EACH NOSTRIL EVERY DAY 11/21/19 02/13/20  Lorre Munroe, NP    Family History Family History  Problem Relation Age of Onset  . Cancer Mother 36       one breast  . Cancer Cousin 24       breast  . Anxiety disorder Other   . Cancer Sister 35       lymph node cancer twice  . Anxiety disorder Sister   . Depression Sister     Social History Social History   Tobacco Use  . Smoking status: Former Smoker    Types: Cigarettes    Quit date: 09/27/2018    Years since quitting: 1.3  . Smokeless tobacco: Never Used  Substance Use Topics  . Alcohol use: Yes    Alcohol/week: 0.0 standard drinks    Comment: occ  . 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   Review  of Systems Review of Systems   Physical Exam Triage Vital Signs ED Triage Vitals  Enc Vitals Group     BP 02/13/20 1036 110/73     Pulse Rate 02/13/20 1036 (!) 102     Resp 02/13/20 1036 16     Temp 02/13/20 1036 98.3 F (36.8 C)     Temp Source 02/13/20 1036 Oral     SpO2 02/13/20 1036 100 %     Weight 02/13/20 1039 130 lb (59 kg)     Height 02/13/20 1039 5\' 6"  (1.676 m)     Head Circumference --      Peak Flow --      Pain Score 02/13/20 1038 10     Pain Loc --      Pain Edu? --      Excl. in GC? --    No data found.  Updated Vital Signs BP 110/73 (BP Location: Left Arm)   Pulse (!) 102   Temp 98.3 F (36.8 C) (Oral)   Resp 16   Ht 5\' 6"  (1.676 m)   Wt 130 lb (59 kg)   LMP 01/26/2020   SpO2 100%   BMI 20.98 kg/m   Visual Acuity Right Eye Distance:   Left Eye Distance:   Bilateral Distance:    Right Eye Near:   Left Eye Near:    Bilateral Near:     Physical Exam Vitals and nursing note reviewed.  Constitutional:      General: She is not in acute  distress.    Appearance: Normal appearance. She is not ill-appearing, toxic-appearing or diaphoretic.  HENT:     Head: Normocephalic.     Nose: Nose normal.  Eyes:     Conjunctiva/sclera: Conjunctivae normal.  Pulmonary:     Effort: Pulmonary effort is normal.  Musculoskeletal:     Cervical back: Normal range of motion.     Left knee: Bony tenderness present. Decreased range of motion. Tenderness present over the MCL, LCL, ACL, PCL and patellar tendon. No LCL laxity, MCL laxity, ACL laxity or PCL laxity.Normal pulse.  Skin:    General: Skin is warm and dry.     Findings: No rash.  Neurological:     Mental Status: She is alert.  Psychiatric:        Mood and Affect: Mood normal.      UC Treatments / Results  Labs (all labs ordered are listed, but only abnormal results are displayed) Labs Reviewed - No data to display  EKG   Radiology DG Knee Complete 4 Views Left  Result Date: 02/13/2020 CLINICAL DATA:  Fall, knee injury and pain. EXAM: LEFT KNEE - COMPLETE 4+ VIEW COMPARISON:  None FINDINGS: Very subtle irregularity of the tibial plateau along the lateral margin. No joint effusion. No additional sign of bony abnormality. IMPRESSION: Very subtle irregularity of the lateral tibial plateau, question nondisplaced fracture or capsular avulsion. Potentially subtle Segond fracture correlate with pain in this area with MRI as warranted. No sign of joint effusion. Electronically Signed   By: 01/28/2020 M.D.   On: 02/13/2020 11:41    Procedures Procedures (including critical care time)  Medications Ordered in UC Medications - No data to display  Initial Impression / Assessment and Plan / UC Course  I have reviewed the triage vital signs and the nursing notes.  Pertinent labs & imaging results that were available during my care of the patient were reviewed by me and considered in my medical decision making (  see chart for details).    X-ray with very subtle irregularity of the  lateral tibial plateau questionable nondisplaced fracture or capsular avulsion. We will place her in the knee brace here and give her crutches to be nonweightbearing. Rest, ice, elevate and ibuprofen for pain as needed. Final Clinical Impressions(s) / UC Diagnoses   Final diagnoses:  Closed fracture of right tibial plateau, initial encounter     Discharge Instructions     Very subtle fracture to the tibial plateau.  We will give you a knee brace here. Crutches for non weight bearing.  Rest, ice, elevate and ibuprofen for pain as needed. If symptoms continue or worsen you need to follow-up with primary care for referral to orthopedics.    ED Prescriptions    Medication Sig Dispense Auth. Provider   ibuprofen (ADVIL) 600 MG tablet Take 1 tablet (600 mg total) by mouth every 8 (eight) hours as needed for moderate pain. 30 tablet Loura Halt A, NP     PDMP not reviewed this encounter.   Orvan July, NP 02/13/20 1314

## 2020-02-13 NOTE — ED Triage Notes (Addendum)
Pt fell down porch steps and felt side way on left knee and she felt a pop and pain shot down left leg. Pt states she mostly has posterior pain. Pt c/o 10/10 sharp throbbing pain in left knee. Pt states it hurts to move her knee or bend it. Pt has 2+ edema of left knee. Pt was able to walk to exam room. Pt denies numbness and tingling.

## 2020-02-13 NOTE — Discharge Instructions (Addendum)
Very subtle fracture to the tibial plateau.  We will give you a knee brace here. Crutches for non weight bearing.  Rest, ice, elevate and ibuprofen for pain as needed. If symptoms continue or worsen you need to follow-up with primary care for referral to orthopedics.

## 2020-02-18 ENCOUNTER — Encounter: Payer: Self-pay | Admitting: Internal Medicine

## 2020-02-18 ENCOUNTER — Ambulatory Visit (INDEPENDENT_AMBULATORY_CARE_PROVIDER_SITE_OTHER): Payer: 59 | Admitting: Internal Medicine

## 2020-02-18 ENCOUNTER — Other Ambulatory Visit: Payer: Self-pay

## 2020-02-18 VITALS — BP 110/78 | HR 89 | Temp 97.7°F | Wt 133.0 lb

## 2020-02-18 DIAGNOSIS — S82132A Displaced fracture of medial condyle of left tibia, initial encounter for closed fracture: Secondary | ICD-10-CM | POA: Diagnosis not present

## 2020-02-18 NOTE — Progress Notes (Signed)
Subjective:    Patient ID: Taylor Bautista, female    DOB: 06/11/1991, 29 y.o.   MRN: [id number]  HPI  Patient presents to the clinic today for ER follow-up.  She went to the ER 6/2 with complaint of left knee pain after falling down some stairs.  X-ray was concerning for a left tibial plateau fracture.  She was placed in a knee brace given a Rx for ibuprofen 600 mg, crutches and advised to apply ice for pain and swelling.  She was discharged and advised to follow-up with orthopedics as an outpatient. She reports persistent pain. She has not been using the crutches all the time. She is requesting referral for orthopedics today.  Review of Systems      Past Medical History:  Diagnosis Date  . Allergy   . Anxiety    no meds  . Dysmenorrhea   . Dysthymic disorder   . Exposure to STD   . MRSA infection   . Sleep apnea    pt states she does not use her CPAP   . Tobacco use disorder     Current Outpatient Medications  Medication Sig Dispense Refill  . cyanocobalamin (,VITAMIN B-12,) 1000 MCG/ML injection INJECT 1 ML (1,000 MCG TOTAL) INTO THE MUSCLE EVERY 30 (THIRTY) DAYS. STARTING 09/04/2019 3 mL 1  . ibuprofen (ADVIL) 600 MG tablet Take 1 tablet (600 mg total) by mouth every 8 (eight) hours as needed for moderate pain. 30 tablet 0   No current facility-administered medications for this visit.    No Known Allergies  Family History  Problem Relation Age of Onset  . Cancer Mother 86       one breast  . Cancer Cousin 24       breast  . Anxiety disorder Other   . Cancer Sister 5       lymph node cancer twice  . Anxiety disorder Sister   . Depression Sister     Social History   Socioeconomic History  . Marital status: Divorced    Spouse name: Not on file  . Number of children: Not on file  . Years of education: Not on file  . Highest education level: Not on file  Occupational History  . Not on file  Tobacco Use  . Smoking status: Former Smoker    Types:  Cigarettes    Quit date: 09/27/2018    Years since quitting: 1.3  . Smokeless tobacco: Never Used  Substance and Sexual Activity  . Alcohol use: Yes    Alcohol/week: 0.0 standard drinks    Comment: occ  . 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 Sexual activity: Not Currently    Partners: Male    Birth control/protection: I.U.D.  Other Topics Concern  . Not on file  Social History Narrative   Lives with mother      Works at [employer]         Social Determinants of Health   Financial Resource Strain:   . Difficulty of Paying Living Expenses:   Food Insecurity:   . Worried About Programme researcher, broadcasting/film/video in the Last Year:   . Barista in the Last Year:   Transportation Needs:   . Freight forwarder (Medical):   Marland Kitchen Lack of Transportation (Non-Medical):   Physical Activity:   . Days of Exercise per Week:   . Minutes of Exercise per Session:  Stress:   . Feeling of Stress :   Social Connections:   . Frequency of Communication with Friends and Family:   . Frequency of Social Gatherings with Friends and Family:   . Attends Religious Services:   . Active Member of Clubs or Organizations:   . Attends Archivist Meetings:   Marland Kitchen Marital Status:   Intimate Partner Violence:   . Fear of Current or Ex-Partner:   . Emotionally Abused:   Marland Kitchen Physically Abused:   . Sexually Abused:      Constitutional: Denies fever, malaise, fatigue, headache or abrupt weight changes.  Musculoskeletal: Pt reports left knee pain. Denies decrease in range of motion, difficulty with gait, muscle pain or joint swelling.    No other specific complaints in a complete review of systems (except as listed in HPI above).  Objective:   Physical Exam BP 110/78   Pulse 89   Temp 97.7 F (36.5 C) (Temporal)   Wt 133 lb (60.3 kg)   LMP 01/26/2020   SpO2 98%   BMI 21.47 kg/m   Wt Readings from Last 3 Encounters:  02/13/20 130  lb (59 kg)  12/11/19 129 lb (58.5 kg)  11/29/19 129 lb (58.5 kg)    General: Appears her stated age, well developed, well nourished in NAD. Skin: Warm, dry and intact. Abrasion noted over right knee. Cardiovascular: Normal rate. Pulmonary/Chest: Normal effort.  Musculoskeletal: Wearing knee brace. Gait slow and steady without device. Neurological: Alert and oriented.   BMET    Component Value Date/Time   NA 140 02/01/2018 1453   K 3.7 02/01/2018 1453   CL 106 02/01/2018 1453   CO2 28 02/01/2018 1453   GLUCOSE 73 02/01/2018 1453   BUN 9 02/01/2018 1453   CREATININE 0.72 02/01/2018 1453   CALCIUM 9.4 02/01/2018 1453   GFRNONAA >60 04/28/2015 2305   GFRAA >60 04/28/2015 2305    Lipid Panel     Component Value Date/Time   CHOL 162 02/01/2018 1453   TRIG 124.0 02/01/2018 1453   HDL 48.90 02/01/2018 1453   CHOLHDL 3 02/01/2018 1453   VLDL 24.8 02/01/2018 1453   LDLCALC 88 02/01/2018 1453    CBC    Component Value Date/Time   WBC 5.9 02/01/2018 1453   RBC 4.41 02/01/2018 1453   HGB 13.9 02/01/2018 1453   HCT 40.1 02/01/2018 1453   PLT 259.0 02/01/2018 1453   MCV 91.0 02/01/2018 1453   MCH 31.0 04/28/2015 2305   MCHC 34.6 02/01/2018 1453   RDW 12.6 02/01/2018 1453   LYMPHSABS 1.6 09/23/2017 1259   MONOABS 0.5 09/23/2017 1259   EOSABS 0.3 09/23/2017 1259   BASOSABS 0.0 09/23/2017 1259    Hgb A1C No results found for: HGBA1C          Assessment & Plan:   ER follow-up for Left Tibial Plateau Fracture:  ER notes, labs and imaging reviewed Referral to orthopedics placed Continue brace, crutches until you follow up with ortho Work note provided  RTC as needed or if symptoms persist or worsen Webb Silversmith, NP This visit occurred during the SARS-CoV-2 public health emergency.  Safety protocols were in place, including screening questions prior to the visit, additional usage of staff PPE, and extensive cleaning of exam room while observing appropriate  contact time as indicated for disinfecting solutions.

## 2020-02-18 NOTE — Patient Instructions (Signed)
Tibial Plateau Fracture Rehab Ask your health care provider which exercises are safe for you. Do exercises exactly as told by your health care provider and adjust them as directed. It is normal to feel mild stretching, pulling, tightness, or discomfort as you do these exercises. Stop right away if you feel sudden pain or your pain gets worse. Do not begin these exercises until told by your health care provider. If told by your health care provider, wear your knee brace while you do these exercises. Stretching and range-of-motion exercises These exercises warm up your muscles and joints and improve the movement and flexibility of your leg. These exercises also help to relieve pain and stiffness. Knee flexion, passive  1. Start this exercise in one of these positions: ? Lying on the floor in front of an open doorway, with your left / right heel and foot lightly touching the wall. ? Lying on the floor with both feet on the wall. ? Lying on a bed with both feet on the wall or headboard. 2. Without using any effort (passive), allow gravity to let your foot slide down the wall slowly until you feel a gentle stretch (flexion) in the front of your left / right knee, or until your knee reaches the angle that your health care provider tells you. 3. Hold this stretch for __________ seconds. 4. Return your leg to the starting position, using your healthy leg to do the work or to help if needed. Repeat __________ times. Complete this exercise __________ times a day. Knee extension, passive  1. Sit with your left / right heel propped on another chair, a coffee table, or a footstool. Do not have anything under your knee to support it. 2. Without using any effort (passive), allow your leg muscles to relax, letting gravity straighten out your knee (extension). Do not let your knee roll inward. You should feel a stretch behind your left / right knee. 3. If told by your health care provider, deepen the stretch by  placing a __________ lb / kg weight on your thigh, just above your kneecap. 4. Hold this position for __________ seconds. Repeat __________ times. Complete this exercise __________ times a day. Strengthening exercises These exercises build strength and endurance in your leg. Endurance is the ability to use your muscles for a long time, even after they get tired. Quadriceps, isometric This exercise stretches the muscles in front of your thigh (quadriceps) without moving your knee joint (isometric). 1. Lie on your back with your left / right leg extended and your other knee bent. Put a rolled towel or a small pillow under your left / right knee if told by your health care provider. 2. Slowly tense the thigh muscles of your extended leg. When you do this, you should see your kneecap slide up toward your hip or see increased dimpling just above your knee. This motion will push the back of your knee down toward the floor and into the towel or pillow. 3. For __________ seconds, hold the muscle as tight as you can without increasing your pain. Do not let your foot lift off the floor. 4. Relax the muscles slowly and completely. Repeat __________ times. Complete this exercise __________ times a day. Straight leg raises, supine This exercise stretches the muscles in front of your thigh (quadriceps) and the muscles that move your hips (hip flexors). 1. Lie on your back (supine position) with your left / right leg extended and your other knee bent. 2. Slowly tense the  thigh muscles of your extended leg. When you do this, you should see your kneecap slide up or see increased dimpling just above the knee. 3. Keep these muscles tight while you raise your leg 4-6 inches (10-15 cm) off the floor. 4. Hold this position for __________ seconds. 5. Keep the muscles tense as you lower your leg. 6. Relax the muscles slowly and completely. Repeat __________ times. Complete this exercise __________ times a day. Straight  leg raises, side-lying This exercise strengthens the muscles that rotate the leg at the hip and move it away from your body (hip abductors). 1. Lie on your side, with your left / right leg in the top position. ? Lie so your head, shoulder, hip, and knee line up with each other. ? You may bend your lower knee to help you balance. 2. Roll your hips slightly forward so your hips are stacked directly over each other and your left / right knee is facing forward. 3. Leading with your heel, lift your top leg 4-6 inches (10-15 cm). ? Do not let your foot drift forward. ? Do not let your knee roll toward the ceiling. 4. Hold this position for __________ seconds. You should feel the muscles in your outer hip lifting, but you may not notice this until your leg begins to tire. 5. Slowly lower your leg to the starting position. 6. Let your muscles relax completely. Repeat __________ times. Complete this exercise __________ times a day. This information is not intended to replace advice given to you by your health care provider. Make sure you discuss any questions you have with your health care provider. Document Revised: 12/26/2018 Document Reviewed: 12/26/2018 Elsevier Patient Education  [place].

## 2020-03-19 ENCOUNTER — Ambulatory Visit (INDEPENDENT_AMBULATORY_CARE_PROVIDER_SITE_OTHER): Payer: 59 | Admitting: Family Medicine

## 2020-03-19 ENCOUNTER — Encounter: Payer: Self-pay | Admitting: Family Medicine

## 2020-03-19 ENCOUNTER — Other Ambulatory Visit: Payer: Self-pay

## 2020-03-19 DIAGNOSIS — S0502XA Injury of conjunctiva and corneal abrasion without foreign body, left eye, initial encounter: Secondary | ICD-10-CM

## 2020-03-19 DIAGNOSIS — S0500XA Injury of conjunctiva and corneal abrasion without foreign body, unspecified eye, initial encounter: Secondary | ICD-10-CM | POA: Insufficient documentation

## 2020-03-19 MED ORDER — ERYTHROMYCIN 5 MG/GM OP OINT: 1.0000 "application " | TOPICAL_OINTMENT | Freq: Three times a day (TID) | OPHTHALMIC | 0 refills | Status: DC

## 2020-03-19 NOTE — Progress Notes (Signed)
Subjective:    Patient ID: Taylor Bautista, female    DOB: 11/27/1990, 29 y.o.   MRN: [id number]  This visit occurred during the SARS-CoV-2 public health emergency.  Safety protocols were in place, including screening questions prior to the visit, additional usage of staff PPE, and extensive cleaning of exam room while observing appropriate contact time as indicated for disinfecting solutions.    HPI Pt presents with L eye discomfort and redness/swelling after a bug flew in it   Last evening was outdoors  Thinks a bug flew into her eye  Was able to pull a little black thing out-? If whole bug  Hurt initially and still hurts now  Red and watery and some swelling   Vision seems ok- but the watering affects it   She does not wear glasses or contacts  Does not go to eye doctor for anything    Review of Systems  Constitutional: Negative for activity change, appetite change, fatigue, fever and unexpected weight change.  HENT: Negative for congestion, ear pain, rhinorrhea, sinus pressure and sore throat.   Eyes: Positive for pain, discharge and redness. Negative for photophobia and visual disturbance.  Respiratory: Negative for cough, shortness of breath and wheezing.   Cardiovascular: Negative for chest pain and palpitations.  Gastrointestinal: Negative for abdominal pain, blood in stool, constipation and diarrhea.  Endocrine: Negative for polydipsia and polyuria.  Genitourinary: Negative for dysuria, frequency and urgency.  Musculoskeletal: Negative for arthralgias, back pain and myalgias.       L knee pain from recent injury  Skin: Negative for pallor and rash.  Allergic/Immunologic: Negative for environmental allergies.  Neurological: Negative for dizziness, syncope and headaches.  Hematological: Negative for adenopathy. Does not bruise/bleed easily.  Psychiatric/Behavioral: Negative for decreased concentration and dysphoric mood. The patient is not nervous/anxious.          Objective:   Physical Exam Constitutional:      General: She is not in acute distress.    Appearance: Normal appearance. She is normal weight. She is not ill-appearing.  HENT:     Head: Normocephalic and atraumatic.     Mouth/Throat:     Mouth: Mucous membranes are moist.  Eyes:     General: No scleral icterus.       Right eye: No discharge.        Left eye: Discharge present.    Extraocular Movements: Extraocular movements intact.     Pupils: Pupils are equal, round, and reactive to light.     Comments: L eye-conjunctival injection and mild swelling of upper and lower lid Some clear tearing  Grossly nl vision  Nl EOMs   On fluorescein exam- lower lateral conjunctival abrasion noted approx 1-2 mm  No fb seen      Musculoskeletal:     Cervical back: Normal range of motion.  Lymphadenopathy:     Cervical: No cervical adenopathy.  Skin:    General: Skin is warm and dry.     Findings: No erythema or rash.  Neurological:     Mental Status: She is alert.     Cranial Nerves: No cranial nerve deficit.     Sensory: No sensory deficit.  Psychiatric:        Mood and Affect: Mood normal.           Assessment & Plan:   Problem List Items Addressed This Visit      Other   Conjunctival abrasion    From trauma last evening (bug  flew in eye)  Seen with fluorescein exam -small and lower/lateral tx with erythromycin ointment tid until improved Also -saline eye wash is ok (inst to avoid eye drops used to whiten eye as they will burn) Update if not starting to improve in a week or if worsening  If change in vision or worsened symptoms -will ref to eye specialist

## 2020-03-19 NOTE — Assessment & Plan Note (Signed)
From trauma last evening (bug flew in eye)  Seen with fluorescein exam -small and lower/lateral tx with erythromycin ointment tid until improved Also -saline eye wash is ok (inst to avoid eye drops used to whiten eye as they will burn) Update if not starting to improve in a week or if worsening  If change in vision or worsened symptoms -will ref to eye specialist

## 2020-03-19 NOTE — Patient Instructions (Addendum)
I think you have a conjunctival abrasion from trauma  Use the erythromycin ointment three times daily until much improved (redness/swelling/pain)  You can use plain saline eye drops as well to flush eye (no clear eyes or visine-those may burn)  If worse or if vision changes -please call and let us know and we will get you to an eye doctor

## 2020-03-20 ENCOUNTER — Ambulatory Visit: Payer: 59 | Admitting: Internal Medicine

## 2020-03-20 DIAGNOSIS — Z0289 Encounter for other administrative examinations: Secondary | ICD-10-CM

## 2020-03-20 NOTE — Progress Notes (Deleted)
Subjective:    Patient ID: Taylor Bautista, female    DOB: [birth year], 29 y.o.   MRN: [id number]  HPI  Pt presents to the clinic today with c/o weak nails.  Review of Systems      Past Medical History:  Diagnosis Date  . Allergy   . Anxiety    no meds  . Dysmenorrhea   . Dysthymic disorder   . Exposure to STD   . MRSA infection   . Sleep apnea    pt states she does not use her CPAP   . Tobacco use disorder     Current Outpatient Medications  Medication Sig Dispense Refill  . cyanocobalamin (,VITAMIN B-12,) 1000 MCG/ML injection INJECT 1 ML (1,000 MCG TOTAL) INTO THE MUSCLE EVERY 30 (THIRTY) DAYS. STARTING 09/04/2019 3 mL 1  . erythromycin ophthalmic ointment Place 1 application into the left eye 3 (three) times daily. 3.5 g 0  . ibuprofen (ADVIL) 600 MG tablet Take 1 tablet (600 mg total) by mouth every 8 (eight) hours as needed for moderate pain. 30 tablet 0   No current facility-administered medications for this visit.    No Known Allergies  Family History  Problem Relation Age of Onset  . Cancer Mother 39       one breast  . Cancer Cousin 24       breast  . Anxiety disorder Other   . Cancer Sister 44       lymph node cancer twice  . Anxiety disorder Sister   . Depression Sister     Social History   Socioeconomic History  . Marital status: Divorced    Spouse name: Not on file  . Number of children: Not on file  . Years of education: Not on file  . Highest education level: Not on file  Occupational History  . Not on file  Tobacco Use  . Smoking status: Former Smoker    Types: Cigarettes    Quit date: 09/27/2018    Years since quitting: 1.4  . Smokeless tobacco: Never Used  Vaping Use  . Vaping Use: Every day  Substance and Sexual Activity  . Alcohol use: Yes    Alcohol/week: 0.0 standard drinks    Comment: occ  . 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 Sexual activity: Not Currently     Partners: Male    Birth control/protection: I.U.D.  Other Topics Concern  . Not on file  Social History Narrative   Lives with mother      Works at [employer]         Social Determinants of Health   Financial Resource Strain:   . Difficulty of Paying Living Expenses:   Food Insecurity:   . Worried About Programme researcher, broadcasting/film/video in the Last Year:   . Barista in the Last Year:   Transportation Needs:   . Freight forwarder (Medical):   Marland Kitchen Lack of Transportation (Non-Medical):   Physical Activity:   . Days of Exercise per Week:   . Minutes of Exercise per Session:   Stress:   . Feeling of Stress :   Social Connections:   . Frequency of Communication with Friends and Family:   . Frequency of Social Gatherings with Friends and Family:   . Attends Religious Services:   . Active Member of Clubs or Organizations:   . Attends Banker Meetings:   .  Marital Status:   Intimate Partner Violence:   . Fear of Current or Ex-Partner:   . Emotionally Abused:   Marland Kitchen Physically Abused:   . Sexually Abused:      Constitutional: Denies fever, malaise, fatigue, headache or abrupt weight changes.  HEENT: Denies eye pain, eye redness, ear pain, ringing in the ears, wax buildup, runny nose, nasal congestion, bloody nose, or sore throat. Respiratory: Denies difficulty breathing, shortness of breath, cough or sputum production.   Cardiovascular: Denies chest pain, chest tightness, palpitations or swelling in the hands or feet.  Gastrointestinal: Denies abdominal pain, bloating, constipation, diarrhea or blood in the stool.  GU: Denies urgency, frequency, pain with urination, burning sensation, blood in urine, odor or discharge. Musculoskeletal: Denies decrease in range of motion, difficulty with gait, muscle pain or joint pain and swelling.  Skin: Pt reports weak nails. Denies redness, rashes, lesions or ulcercations.  Neurological: Denies dizziness, difficulty with memory,  difficulty with speech or problems with balance and coordination.  Psych: Denies anxiety, depression, SI/HI.  No other specific complaints in a complete review of systems (except as listed in HPI above).  Objective:   Physical Exam  There were no vitals taken for this visit. Wt Readings from Last 3 Encounters:  03/19/20 131 lb (59.4 kg)  02/18/20 133 lb (60.3 kg)  02/13/20 130 lb (59 kg)    General: Appears their stated age, well developed, well nourished in NAD. Skin: Warm, dry and intact. No rashes, lesions or ulcerations noted. HEENT: Head: normal shape and size; Eyes: sclera white, no icterus, conjunctiva pink, PERRLA and EOMs intact; Ears: Tm's gray and intact, normal light reflex; Nose: mucosa pink and moist, septum midline; Throat/Mouth: Teeth present, mucosa pink and moist, no exudate, lesions or ulcerations noted.  Neck:  Neck supple, trachea midline. No masses, lumps or thyromegaly present.  Cardiovascular: Normal rate and rhythm. S1,S2 noted.  No murmur, rubs or gallops noted. No JVD or BLE edema. No carotid bruits noted. Pulmonary/Chest: Normal effort and positive vesicular breath sounds. No respiratory distress. No wheezes, rales or ronchi noted.  Abdomen: Soft and nontender. Normal bowel sounds. No distention or masses noted. Liver, spleen and kidneys non palpable. Musculoskeletal: Normal range of motion. No signs of joint swelling. No difficulty with gait.  Neurological: Alert and oriented. Cranial nerves II-XII grossly intact. Coordination normal.  Psychiatric: Mood and affect normal. Behavior is normal. Judgment and thought content normal.   EKG:  BMET    Component Value Date/Time   NA 140 02/01/2018 1453   K 3.7 02/01/2018 1453   CL 106 02/01/2018 1453   CO2 28 02/01/2018 1453   GLUCOSE 73 02/01/2018 1453   BUN 9 02/01/2018 1453   CREATININE 0.72 02/01/2018 1453   CALCIUM 9.4 02/01/2018 1453   GFRNONAA >60 04/28/2015 2305   GFRAA >60 04/28/2015 2305     Lipid Panel     Component Value Date/Time   CHOL 162 02/01/2018 1453   TRIG 124.0 02/01/2018 1453   HDL 48.90 02/01/2018 1453   CHOLHDL 3 02/01/2018 1453   VLDL 24.8 02/01/2018 1453   LDLCALC 88 02/01/2018 1453    CBC    Component Value Date/Time   WBC 5.9 02/01/2018 1453   RBC 4.41 02/01/2018 1453   HGB 13.9 02/01/2018 1453   HCT 40.1 02/01/2018 1453   PLT 259.0 02/01/2018 1453   MCV 91.0 02/01/2018 1453   MCH 31.0 04/28/2015 2305   MCHC 34.6 02/01/2018 1453   RDW 12.6 02/01/2018 1453  LYMPHSABS 1.6 09/23/2017 1259   MONOABS 0.5 09/23/2017 1259   EOSABS 0.3 09/23/2017 1259   BASOSABS 0.0 09/23/2017 1259    Hgb A1C No results found for: HGBA1C          Assessment & Plan:    Nicki Reaper, NP This visit occurred during the SARS-CoV-2 public health emergency.  Safety protocols were in place, including screening questions prior to the visit, additional usage of staff PPE, and extensive cleaning of exam room while observing appropriate contact time as indicated for disinfecting solutions.

## 2020-03-24 ENCOUNTER — Other Ambulatory Visit: Payer: Self-pay

## 2020-03-24 MED ORDER — FLUCONAZOLE 150 MG PO TABS: 150.0000 mg | ORAL_TABLET | Freq: Once | ORAL | 2 refills | Status: AC

## 2020-03-24 NOTE — Telephone Encounter (Signed)
Patient thinks she has yeast infection and would like a diflucan 150 mg called into her pharmacy.

## 2020-03-27 ENCOUNTER — Other Ambulatory Visit: Payer: Self-pay

## 2020-03-27 DIAGNOSIS — B379 Candidiasis, unspecified: Secondary | ICD-10-CM

## 2020-03-27 MED ORDER — FLUCONAZOLE 150 MG PO TABS: 150.0000 mg | ORAL_TABLET | Freq: Once | ORAL | 0 refills | Status: AC

## 2020-04-21 ENCOUNTER — Other Ambulatory Visit: Payer: Self-pay

## 2020-04-21 ENCOUNTER — Ambulatory Visit (INDEPENDENT_AMBULATORY_CARE_PROVIDER_SITE_OTHER): Payer: 59 | Admitting: Internal Medicine

## 2020-04-21 ENCOUNTER — Encounter: Payer: Self-pay | Admitting: Internal Medicine

## 2020-04-21 VITALS — BP 100/66 | HR 74 | Temp 98.4°F | Wt 132.0 lb

## 2020-04-21 DIAGNOSIS — N898 Other specified noninflammatory disorders of vagina: Secondary | ICD-10-CM | POA: Diagnosis not present

## 2020-04-21 DIAGNOSIS — R300 Dysuria: Secondary | ICD-10-CM

## 2020-04-21 LAB — POC URINALSYSI DIPSTICK (AUTOMATED)
Bilirubin, UA: NEGATIVE
Blood, UA: NEGATIVE
Glucose, UA: NEGATIVE
Ketones, UA: NEGATIVE
Leukocytes, UA: NEGATIVE
Nitrite, UA: NEGATIVE
Protein, UA: POSITIVE — AB
Spec Grav, UA: >=1.03 — AB (ref 1.010–1.025)
Urobilinogen, UA: 0.2 E.U./dL
pH, UA: 6 (ref 5.0–8.0)

## 2020-04-21 NOTE — Patient Instructions (Signed)

## 2020-04-21 NOTE — Addendum Note (Signed)
Addended by: Roena Malady on: 04/21/2020 11:06 AM   Modules accepted: Orders

## 2020-04-21 NOTE — Progress Notes (Signed)
HPI  Pt presents to the clinic today with c/o dysuria, vaginal itching and and pelvic pain. This started 1 week ago. She denies urgency, frequency or blood in her urine. She had some discharge with itching last week that was thick- no odor, but has since resolved. She describes the pain as achy. The pain does not radiate. She has some low back pain but denies fever, chills, or nausea. She has tried OTC yeast cream with minimal relief. She denies changes in sexual partners, feminine washes, soaps, lotions or detergents.   Review of Systems  Past Medical History:  Diagnosis Date  . Allergy   . Anxiety    no meds  . Dysmenorrhea   . Dysthymic disorder   . Exposure to STD   . MRSA infection   . Sleep apnea    pt states she does not use her CPAP   . Tobacco use disorder     Family History  Problem Relation Age of Onset  . Cancer Mother 39       one breast  . Cancer Cousin 24       breast  . Anxiety disorder Other   . Cancer Sister 46       lymph node cancer twice  . Anxiety disorder Sister   . Depression Sister     Social History   Socioeconomic History  . Marital status: Divorced    Spouse name: Not on file  . Number of children: Not on file  . Years of education: Not on file  . Highest education level: Not on file  Occupational History  . Not on file  Tobacco Use  . Smoking status: Former Smoker    Types: Cigarettes    Quit date: 09/27/2018    Years since quitting: 1.5  . Smokeless tobacco: Never Used  Vaping Use  . Vaping Use: Every day  Substance and Sexual Activity  . Alcohol use: Yes    Alcohol/week: 0.0 standard drinks    Comment: occ  . 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 Sexual activity: Not Currently    Partners: Male    Birth control/protection: I.U.D.  Other Topics Concern  . Not on file  Social History Narrative   Lives with mother      Works at [employer]         Social  Determinants of Health   Financial Resource Strain:   . Difficulty of Paying Living Expenses:   Food Insecurity:   . Worried About Programme researcher, broadcasting/film/video in the Last Year:   . Barista in the Last Year:   Transportation Needs:   . Freight forwarder (Medical):   Marland Kitchen Lack of Transportation (Non-Medical):   Physical Activity:   . Days of Exercise per Week:   . Minutes of Exercise per Session:   Stress:   . Feeling of Stress :   Social Connections:   . Frequency of Communication with Friends and Family:   . Frequency of Social Gatherings with Friends and Family:   . Attends Religious Services:   . Active Member of Clubs or Organizations:   . Attends Banker Meetings:   Marland Kitchen Marital Status:   Intimate Partner Violence:   . Fear of Current or Ex-Partner:   . Emotionally Abused:   Marland Kitchen Physically Abused:   . Sexually Abused:     No Known Allergies   Constitutional:  Denies fever, malaise, fatigue, headache or abrupt weight changes.   GU: Pt reports vaginal itching,  pain with urination and vaginal itching. Denies urgency, frequency,  burning sensation, blood in urine, odor or discharge. Skin: Denies redness, rashes, lesions or ulcercations.   No other specific complaints in a complete review of systems (except as listed in HPI above).    Objective:   Physical Exam  BP 100/66   Pulse 74   Temp 98.4 F (36.9 C) (Temporal)   Wt 132 lb (59.9 kg)   LMP 04/16/2020   SpO2 98%   BMI 21.31 kg/m   Wt Readings from Last 3 Encounters:  03/19/20 131 lb (59.4 kg)  02/18/20 133 lb (60.3 kg)  02/13/20 130 lb (59 kg)    General: Appears her stated age, well developed, well nourished in NAD. Cardiovascular: Normal rate and rhythm. S1,S2 noted.   Pulmonary/Chest: Normal effort and positive vesicular breath sounds. No respiratory distress. No wheezes, rales or ronchi noted.  Abdomen: Soft mildly tender in the RLQ. Normal bowel sounds. No distention or masses noted.  No  CVA tenderness. Pelvic: Self swabbed.        Assessment & Plan:   Dysuria, Vaginal Itching:  Urinalysis: trace protein Will send urine culture Will check urine gonorrhea and chlamydia Will send off wet prep Drink plenty of fluids  RTC as needed or if symptoms persist. Nicki Reaper, NP This visit occurred during the SARS-CoV-2 public health emergency.  Safety protocols were in place, including screening questions prior to the visit, additional usage of staff PPE, and extensive cleaning of exam room while observing appropriate contact time as indicated for disinfecting solutions.

## 2020-04-22 ENCOUNTER — Telehealth: Payer: Self-pay | Admitting: Internal Medicine

## 2020-04-22 LAB — URINE CULTURE
MICRO NUMBER:: 10803117
SPECIMEN QUALITY:: ADEQUATE

## 2020-04-22 LAB — WET PREP BY MOLECULAR PROBE
Candida species: NOT DETECTED
Gardnerella vaginalis: NOT DETECTED
MICRO NUMBER:: 10803119
SPECIMEN QUALITY:: ADEQUATE
Trichomonas vaginosis: NOT DETECTED

## 2020-04-22 LAB — C. TRACHOMATIS/N. GONORRHOEAE RNA
C. trachomatis RNA, TMA: NOT DETECTED
N. gonorrhoeae RNA, TMA: NOT DETECTED

## 2020-04-22 NOTE — Telephone Encounter (Signed)
Pt called she has ? About results that was posted on mychart

## 2020-04-23 ENCOUNTER — Encounter: Payer: Self-pay | Admitting: Internal Medicine

## 2020-06-05 ENCOUNTER — Telehealth: Payer: Self-pay | Admitting: Internal Medicine

## 2020-06-05 ENCOUNTER — Ambulatory Visit (HOSPITAL_COMMUNITY)
Admission: EM | Admit: 2020-06-05 | Discharge: 2020-06-05 | Disposition: A | Discharge status: Home / Self Care | Payer: 59 | Attending: Family Medicine | Admitting: Family Medicine

## 2020-06-05 ENCOUNTER — Other Ambulatory Visit: Payer: Self-pay

## 2020-06-05 ENCOUNTER — Encounter (HOSPITAL_COMMUNITY): Payer: Self-pay | Admitting: Emergency Medicine

## 2020-06-05 DIAGNOSIS — R059 Cough, unspecified: Secondary | ICD-10-CM

## 2020-06-05 DIAGNOSIS — R0602 Shortness of breath: Secondary | ICD-10-CM

## 2020-06-05 DIAGNOSIS — U071 COVID-19: Secondary | ICD-10-CM | POA: Diagnosis not present

## 2020-06-05 MED ORDER — AEROCHAMBER PLUS FLO-VU LARGE MISC
Status: AC
  Filled 2020-06-05: qty 1

## 2020-06-05 MED ORDER — ALBUTEROL SULFATE HFA 108 (90 BASE) MCG/ACT IN AERS
2.0000 | INHALATION_SPRAY | Freq: Once | RESPIRATORY_TRACT | Status: AC
  Administered 2020-06-05: 2 via RESPIRATORY_TRACT

## 2020-06-05 MED ORDER — AEROCHAMBER PLUS FLO-VU MEDIUM MISC
1.0000 | Freq: Once | Status: AC
  Administered 2020-06-05: 1

## 2020-06-05 MED ORDER — ALBUTEROL SULFATE HFA 108 (90 BASE) MCG/ACT IN AERS
INHALATION_SPRAY | RESPIRATORY_TRACT | Status: AC
  Filled 2020-06-05: qty 6.7

## 2020-06-05 NOTE — Telephone Encounter (Signed)
Pt was exposed to + covid person on 05/24/20; pt or the other person did not know at that time the other person had covid; pt was tested by private testing agency and received + covid results on 05/30/20. Pt has body aches, H/A on and off, weakness; prod cough with yellow phlegm, wheezing and SOB all the time. Pt has had watery diarrhea for 4 days; last diarrhea was this morning. Pt has dry mouth and dizziness. Pt has no taste or smell; pt rt eye is red,with drainage and itching. Pt last urinated this morning with normal color and amt for pt. Pt has not had covid vaccine. Pt has not been eval by provider since symptoms began. Pt will go to [facility] on [address]FYI to [employer] NP.

## 2020-06-05 NOTE — ED Provider Notes (Signed)
[facility]    CSN: [id number] Arrival date & time: 06/05/20  0854    History   Chief Complaint Chief Complaint  Patient presents with  . Shortness of Breath  . covid positive    HPI Taylor Bautista is a 29 y.o. female.   HPI   Patient presents with an active COVID-19 infection. Patient was confirmed to be positive on 05/26/2020. Patient was referred to urgent care for evaluation of shortness of breath which is remained unchanged since the onset of her infection. Patient also was interested in obtaining a monoclonal infusion however given the timeframe of when her infection initiated she is outside of the window and she does not have any associated high risk chronic conditions that we would qualify her for the infusion. She has not taken anything for her symptoms as she has just some mild cough. Reports she typically does not take medication however she is taking prenatal vitamins B12 supplements for symptom management.   Past Medical History:  Diagnosis Date  . Allergy   . Anxiety    no meds  . Dysmenorrhea   . Dysthymic disorder   . Exposure to STD   . MRSA infection   . Sleep apnea    pt states she does not use her CPAP   . Tobacco use disorder     Patient Active Problem List   Diagnosis Date Noted  . B12 deficiency 12/20/2019  . Episodic paroxysmal anxiety disorder 12/26/2014  . Migraine without aura 12/05/2012    Past Surgical History:  Procedure Laterality Date  . FOOT SURGERY  2016  . LAPAROSCOPIC TUBAL LIGATION Bilateral 08/02/2018   Procedure: LAPAROSCOPIC TUBAL LIGATION - Filshie Clips;  Surgeon: Konterra Bing, MD;  Location: [facility];  Service: Gynecology;  Laterality: Bilateral;  . Pigmented nevus removal  04/1989    OB History    Gravida  3   Para  2   Term  1   Preterm  1   AB  1   Living  2     SAB  0   TAB  1   Ectopic  0   Multiple  0   Live Births  2            Home Medications     Prior to Admission medications   Medication Sig Start Date End Date Taking? Authorizing Provider  erythromycin ophthalmic ointment Place 1 application into the left eye 3 (three) times daily. 03/19/20   Tower, Audrie Gallus, MD  ibuprofen (ADVIL) 600 MG tablet Take 1 tablet (600 mg total) by mouth every 8 (eight) hours as needed for moderate pain. 02/13/20   Janace Aris, NP  fluticasone (FLONASE) 50 MCG/ACT nasal spray SPRAY 2 SPRAYS INTO EACH NOSTRIL EVERY DAY 11/21/19 02/13/20  Lorre Munroe, NP    Family History Family History  Problem Relation Age of Onset  . Cancer Mother 69       one breast  . Cancer Cousin 24       breast  . Anxiety disorder Other   . Cancer Sister 68       lymph node cancer twice  . Anxiety disorder Sister   . Depression Sister     Social History Social History   Tobacco Use  . Smoking status: Former Smoker    Types: Cigarettes    Quit date: 09/27/2018    Years since quitting: 1.6  . Smokeless tobacco: Never Used  Vaping Use  .  Vaping Use: Every day  Substance Use Topics  . Alcohol use: Yes    Alcohol/week: 0.0 standard drinks    Comment: occ  . 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   Review of Systems Review of Systems Pertinent negatives listed in HPI Physical Exam Triage Vital Signs ED Triage Vitals  Enc Vitals Group     BP 06/05/20 1002 100/71     Pulse Rate 06/05/20 1002 76     Resp 06/05/20 1002 20     Temp 06/05/20 1002 98 F (36.7 C)     Temp Source 06/05/20 1002 Oral     SpO2 06/05/20 1002 100 %     Weight --      Height --      Head Circumference --      Peak Flow --      Pain Score 06/05/20 0959 7     Pain Loc --      Pain Edu? --      Excl. in GC? --    No data found.  Updated Vital Signs BP 100/71 (BP Location: Right Arm)   Pulse 76   Temp 98 F (36.7 C) (Oral)   Resp 20   LMP 05/15/2020   SpO2 100%    Visual Acuity Right Eye Distance:   Left Eye Distance:   Bilateral Distance:    Right Eye Near:   Left Eye Near:    Bilateral Near:     Physical Exam General appearance: alert, mildly ill appearing  Hhead: Normocephalic, without obvious abnormality, atraumatic Respiratory: Respirations even and unlabored, normal respiratory rate, no wheezing, rhonchi or crackle Heart: rate and rhythm normal. No gallop or murmurs noted on exam  Abdomen: BS +, no distention, no rebound tenderness, or no mass Skin: Skin color, texture, turgor normal. No rashes seen  Psych: Appropriate mood and affect UC Treatments / Results  Labs (all labs ordered are listed, but only abnormal results are displayed) Labs Reviewed - No data to display  EKG   Radiology No results found.  Procedures Procedures (including critical care time)  Medications Ordered in UC Medications  albuterol (VENTOLIN HFA) 108 (90 Base) MCG/ACT inhaler 2 puff (has no administration in time range)  AeroChamber Plus Flo-Vu Medium MISC 1 each (has no administration in time range)    Initial Impression / Assessment and Plan / UC Course  I have reviewed the triage vital signs and the nursing notes.  Pertinent labs & imaging results that were available during my care of the patient were reviewed by me and considered in my medical decision making (see chart for details).    Patient with an active COVID-19 infection diagnosed on 05/26/2020 from an outside agency presents today for persistent shortness of breath since onset of virus. Exam findings are reassuring. No concern for bronchial or pneumonia secondary infection. Suspect symptoms are related to inflammatory action of COVID-19 on the lungs. Oxygen saturation is also reassuring. Will trial a course of albuterol 2 puffs every 4-6 hours as needed for shortness of breath. Encouraged to continue viral supplement regimen to help reduce severity of symptoms. Patient does not qualify for  COVID-19 infusion however did give her information to follow-up with the COVID-19 clinic if her symptoms continue to persist or worsen. Patient verbalized understanding agreement with plan. Final Clinical Impressions(s) / UC Diagnoses   Final diagnoses:  COVID-19 virus infection  Shortness of breath  Cough     Discharge Instructions     You may follow-up at the [facility] health COVID clinic if you experience any other symptoms that you feel need to be evaluated or if you persistently have symptoms following the 10-day quarantine.  Feel free to contact their office to schedule an appointment.  The number is [phone]    ED Prescriptions    None     PDMP not reviewed this encounter.   Bing Neighbors, FNP 06/05/20 1103

## 2020-06-05 NOTE — Telephone Encounter (Signed)
Will review ED note 

## 2020-06-05 NOTE — Discharge Instructions (Addendum)
You may follow-up at the [facility] health COVID clinic if you experience any other symptoms that you feel need to be evaluated or if you persistently have symptoms following the 10-day quarantine.  Feel free to contact their office to schedule an appointment.  The number is [phone]

## 2020-06-05 NOTE — Telephone Encounter (Signed)
[facility] - Client TELEPHONE ADVICE RECORD AccessNurse Patient Name: JHORDAN KINTER Gender: Female DOB: Jan 11, 1991 Age: 29 Y 4 M 18 D Return Phone Number: [phone] (Primary) Address[place] [address] Client [facility] - Client Client Site  [facility] [place] - Day Physician Nicki Reaper - NP Contact Type Call Who Is Calling Patient / Member / Family / Caregiver Call Type Triage / Clinical Relationship To Patient Self Return Phone Number [phone] (Primary) Chief Complaint BREATHING - shortness of breath or sounds breathless Reason for Call Symptomatic / Request for Health Information Initial Comment Caller has been suffering from COVID, but she is out of quarantine. Caller is still experiencing shortness of breath. Translation No Nurse Assessment Nurse: Anner Crete, RN, Olegario Messier Date/Time (Eastern Time): 06/05/2020 8:26:15 AM Confirm and document reason for call. If symptomatic, describe symptoms. ---Caller has been suffering from COVID, but she is out of quarantine. Caller is still experiencing shortness of breath. Does the patient have any new or worsening symptoms? ---Yes Will a triage be completed? ---Yes Related visit to physician within the last 2 weeks? ---Yes Does the PT have any chronic conditions? (i.e. diabetes, asthma, this includes High risk factors for pregnancy, etc.) ---Yes List chronic conditions. ---Vit B-12 deficiency Is the patient pregnant or possibly pregnant? (Ask all females between the ages of 64-55) ---No Is this a behavioral health or substance abuse call? ---No Guidelines Guideline Title Affirmed Question Affirmed Notes Nurse Date/Time (Eastern Time) COVID-19 - Diagnosed or Suspected MODERATE difficulty breathing (e.g., speaks in phrases, SOB even at rest, pulse 100-120) Anner Crete, RN, Olegario Messier 06/05/2020 8:27:25 AM Disp. Time Lamount Cohen Time) Disposition  Final User 06/05/2020 8:24:39 AM Send to Urgent Fanny Bien, Tyrechia 06/05/2020 8:31:23 AM Go to ED Now Yes Anner Crete, RN, Olegario Messier PLEASE NOTE: All timestamps contained within this report are represented as [place] Standard Time. CONFIDENTIALTY NOTICE: This fax transmission is intended only for the addressee. It contains information that is legally privileged, confidential or otherwise protected from use or disclosure. If you are not the intended recipient, you are strictly prohibited from reviewing, disclosing, copying using or disseminating any of this information or taking any action in reliance on or regarding this information. If you have received this fax in error, please notify us immediately by telephone so that we can arrange for its return to [place]. Phone: [phone], Toll-Free: [phone], Fax: [phone] Page: 2 of 2 Call Id: [id number] Caller Disagree/Comply Disagree Caller Understands Yes PreDisposition Call Doctor Care Advice Given Per Guideline GO TO ED NOW: * You need to be seen in the Emergency Department. WEAR A MASK - COVER YOUR MOUTH AND NOSE: * Wear a mask. ANOTHER ADULT SHOULD DRIVE: * It is better and safer if another adult drives instead of you. CARE ADVICE given per COVID-19 - DIAGNOSED OR SUSPECTED (Adult) guideline. Comments User: Alphonzo Cruise, RN Date/Time Lamount Cohen Time): 06/05/2020 8:39:30 AM I advised Reena of patient refusing to go to ED and wanting to be seen in office instead. Referrals GO TO FACILITY REFUSED

## 2020-06-05 NOTE — Telephone Encounter (Signed)
Per chart review tab pt is at [facility] now. 

## 2020-06-05 NOTE — Telephone Encounter (Signed)
Pt said she is still having shortness of breath after covid and would like to get infusion. Since she is still having shortness of breath sent patient to access nurse to be triaged.

## 2020-06-05 NOTE — ED Triage Notes (Signed)
9/13/2021was onset of covid and sob.  Sob has worsened and is currently trying to get set up for infusion therapy.  That site and patient are requesting evaluation of lungs.  Yellow phlegm with cough

## 2020-06-06 ENCOUNTER — Telehealth (HOSPITAL_COMMUNITY): Payer: Self-pay | Admitting: Emergency Medicine

## 2020-06-06 MED ORDER — BENZONATATE 100 MG PO CAPS
100.0000 mg | ORAL_CAPSULE | Freq: Three times a day (TID) | ORAL | 0 refills | Status: DC | PRN
Start: 2020-06-06 — End: 2020-06-30

## 2020-06-06 NOTE — Telephone Encounter (Signed)
Patient called and states she was watiing on a cough medicine which the pharmacy states they have not received yet.  This RN does not note any prescriptions from her recent visit.  Spoke to provider who gave verbal for Tessalon, called patient to verify pharmacy and prescription sent.  Patient states she bought and has started all of her recommended supplements.

## 2020-06-30 ENCOUNTER — Ambulatory Visit (INDEPENDENT_AMBULATORY_CARE_PROVIDER_SITE_OTHER): Payer: 59 | Admitting: Internal Medicine

## 2020-06-30 ENCOUNTER — Encounter: Payer: Self-pay | Admitting: Internal Medicine

## 2020-06-30 ENCOUNTER — Other Ambulatory Visit: Payer: Self-pay

## 2020-06-30 VITALS — BP 104/68 | HR 91 | Temp 98.4°F | Wt 132.0 lb

## 2020-06-30 DIAGNOSIS — R102 Pelvic and perineal pain: Secondary | ICD-10-CM

## 2020-06-30 DIAGNOSIS — M545 Low back pain, unspecified: Secondary | ICD-10-CM | POA: Diagnosis not present

## 2020-06-30 LAB — POC URINALSYSI DIPSTICK (AUTOMATED)
Bilirubin, UA: NEGATIVE
Blood, UA: NEGATIVE
Glucose, UA: NEGATIVE
Ketones, UA: NEGATIVE
Nitrite, UA: NEGATIVE
Protein, UA: NEGATIVE
Spec Grav, UA: >=1.03 — AB (ref 1.010–1.025)
Urobilinogen, UA: 0.2 E.U./dL
pH, UA: 6 (ref 5.0–8.0)

## 2020-06-30 MED ORDER — IBUPROFEN 800 MG PO TABS: 800.0000 mg | ORAL_TABLET | Freq: Three times a day (TID) | ORAL | 2 refills | Status: DC | PRN

## 2020-06-30 NOTE — Patient Instructions (Signed)
Pelvic Pain, Female Pelvic pain is pain in your lower belly (abdomen), below your belly button and between your hips. The pain may start suddenly (be acute), keep coming back (be recurring), or last a long time (become chronic). Pelvic pain that lasts longer than 6 months is called chronic pelvic pain. There are many causes of pelvic pain. Sometimes the cause of pelvic pain is not known. Follow these instructions at home:   Take over-the-counter and prescription medicines only as told by your doctor.  Rest as told by your doctor.  Do not have sex if it hurts.  Keep a journal of your pelvic pain. Write down: ? When the pain started. ? Where the pain is located. ? What seems to make the pain better or worse, such as food or your period (menstrual cycle). ? Any symptoms you have along with the pain.  Keep all follow-up visits as told by your doctor. This is important. Contact a doctor if:  Medicine does not help your pain.  Your pain comes back.  You have new symptoms.  You have unusual discharge or bleeding from your vagina.  You have a fever or chills.  You are having trouble pooping (constipation).  You have blood in your pee (urine) or poop (stool).  Your pee smells bad.  You feel weak or light-headed. Get help right away if:  You have sudden pain that is very bad.  Your pain keeps getting worse.  You have very bad pain and also have any of these symptoms: ? A fever. ? Feeling sick to your stomach (nausea). ? Throwing up (vomiting). ? Being very sweaty.  You pass out (lose consciousness). Summary  Pelvic pain is pain in your lower belly (abdomen), below your belly button and between your hips.  There are many possible causes of pelvic pain.  Keep a journal of your pelvic pain. This information is not intended to replace advice given to you by your health care provider. Make sure you discuss any questions you have with your health care provider. Document  Revised: 02/15/2018 Document Reviewed: 02/15/2018 Elsevier Patient Education  2020 [employer]  

## 2020-06-30 NOTE — Progress Notes (Signed)
Subjective:    Patient ID: Taylor Bautista, female    DOB: [birth year], 29 y.o.   MRN: [id number]  HPI  Pt presents to the clinic today with c/o abdominal pain and back pain. She reports the abdominal pain started 10 days ago. She describes the pain as. The pain is worse with intercourse. She reports the back pain started 2-3 days ago. She describes that pain as sharp and shooting. She denies nausea, vomiting, constipation, diarrhea or blood in her stool. She denies urinary or vaginal complaints. She denies pain, numbness, tingling, or weakness in her lower extremities. She denies loss of bowel or bladder control. She denies any injury to the area. She has not taken anything OTC for this.   Review of Systems      Past Medical History:  Diagnosis Date  . Allergy   . Anxiety    no meds  . Dysmenorrhea   . Dysthymic disorder   . Exposure to STD   . MRSA infection   . Sleep apnea    pt states she does not use her CPAP   . Tobacco use disorder     Current Outpatient Medications  Medication Sig Dispense Refill  . benzonatate (TESSALON) 100 MG capsule Take 1 capsule (100 mg total) by mouth 3 (three) times daily as needed for cough. 21 capsule 0  . erythromycin ophthalmic ointment Place 1 application into the left eye 3 (three) times daily. 3.5 g 0  . ibuprofen (ADVIL) 600 MG tablet Take 1 tablet (600 mg total) by mouth every 8 (eight) hours as needed for moderate pain. 30 tablet 0   No current facility-administered medications for this visit.    No Known Allergies  Family History  Problem Relation Age of Onset  . Cancer Mother 22       one breast  . Cancer Cousin 24       breast  . Anxiety disorder Other   . Cancer Sister 44       lymph node cancer twice  . Anxiety disorder Sister   . Depression Sister     Social History   Socioeconomic History  . Marital status: Divorced    Spouse name: Not on file  . Number of children: Not on file  . Years of education: Not on  file  . Highest education level: Not on file  Occupational History  . Not on file  Tobacco Use  . Smoking status: Former Smoker    Types: Cigarettes    Quit date: 09/27/2018    Years since quitting: 1.7  . Smokeless tobacco: Never Used  Vaping Use  . Vaping Use: Every day  Substance and Sexual Activity  . Alcohol use: Yes    Alcohol/week: 0.0 standard drinks    Comment: occ  . 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 Sexual activity: Not Currently    Partners: Male    Birth control/protection: I.U.D.  Other Topics Concern  . Not on file  Social History Narrative   Lives with mother      Works at [employer]         Social Determinants of Health   Financial Resource Strain:   . Difficulty of Paying Living Expenses: Not on file  Food Insecurity:   . Worried About Programme researcher, broadcasting/film/video in the Last Year: Not on file  . Ran Out of Food in the Last Year: Not  on file  Transportation Needs:   . Lack of Transportation (Medical): Not on file  . Lack of Transportation (Non-Medical): Not on file  Physical Activity:   . Days of Exercise per Week: Not on file  . Minutes of Exercise per Session: Not on file  Stress:   . Feeling of Stress : Not on file  Social Connections:   . Frequency of Communication with Friends and Family: Not on file  . Frequency of Social Gatherings with Friends and Family: Not on file  . Attends Religious Services: Not on file  . Active Member of Clubs or Organizations: Not on file  . Attends Banker Meetings: Not on file  . Marital Status: Not on file  Intimate Partner Violence:   . Fear of Current or Ex-Partner: Not on file  . Emotionally Abused: Not on file  . Physically Abused: Not on file  . Sexually Abused: Not on file     Constitutional: Denies fever, malaise, fatigue, headache or abrupt weight changes.  Respiratory: Denies difficulty breathing, shortness of breath, cough  or sputum production.   Cardiovascular: Denies chest pain, chest tightness, palpitations or swelling in the hands or feet.  Gastrointestinal: Pt reports abdominal pain. Denies bloating, constipation, diarrhea or blood in the stool.  GU: Denies urgency, frequency, pain with urination, burning sensation, blood in urine, odor or discharge. Musculoskeletal: Pt reports back pain. Denies decrease in range of motion, difficulty with gait, or joint pain and swelling.  Skin: Denies redness, rashes, lesions or ulcercations.  Neurological: Denies numbness, tingling, weakness or problems with balance and coordination.    No other specific complaints in a complete review of systems (except as listed in HPI above).  Objective:   Physical Exam  BP 104/68   Pulse 91   Temp 98.4 F (36.9 C) (Temporal)   Wt 132 lb (59.9 kg)   LMP 06/16/2020   SpO2 98%   BMI 21.31 kg/m   Wt Readings from Last 3 Encounters:  04/21/20 132 lb (59.9 kg)  03/19/20 131 lb (59.4 kg)  02/18/20 133 lb (60.3 kg)    General: Appears her stated age, well developed, well nourished in NAD. Skin: Warm, dry and intact. No rashes noted. HEENT: Head: normal shape and size;  Cardiovascular: Normal rate and rhythm. S1,S2 noted.  No murmur, rubs or gallops noted.  Pulmonary/Chest: Normal effort and positive vesicular breath sounds. No respiratory distress. No wheezes, rales or ronchi noted.  Abdomen: Soft and tender in bilateral lower quadrants R>L. Normal bowel sounds. No distention or masses noted.  Musculoskeletal: Normal flexion, extension and rotation of the spine. No bony tenderness noted over the spine. No difficulty with gait.  Neurological: Alert and oriented.   BMET    Component Value Date/Time   NA 140 02/01/2018 1453   K 3.7 02/01/2018 1453   CL 106 02/01/2018 1453   CO2 28 02/01/2018 1453   GLUCOSE 73 02/01/2018 1453   BUN 9 02/01/2018 1453   CREATININE 0.72 02/01/2018 1453   CALCIUM 9.4 02/01/2018 1453    GFRNONAA >60 04/28/2015 2305   GFRAA >60 04/28/2015 2305    Lipid Panel     Component Value Date/Time   CHOL 162 02/01/2018 1453   TRIG 124.0 02/01/2018 1453   HDL 48.90 02/01/2018 1453   CHOLHDL 3 02/01/2018 1453   VLDL 24.8 02/01/2018 1453   LDLCALC 88 02/01/2018 1453    CBC    Component Value Date/Time   WBC 5.9 02/01/2018 1453  RBC 4.41 02/01/2018 1453   HGB 13.9 02/01/2018 1453   HCT 40.1 02/01/2018 1453   PLT 259.0 02/01/2018 1453   MCV 91.0 02/01/2018 1453   MCH 31.0 04/28/2015 2305   MCHC 34.6 02/01/2018 1453   RDW 12.6 02/01/2018 1453   LYMPHSABS 1.6 09/23/2017 1259   MONOABS 0.5 09/23/2017 1259   EOSABS 0.3 09/23/2017 1259   BASOSABS 0.0 09/23/2017 1259    Hgb A1C No results found for: HGBA1C          Assessment & Plan:   Pelvic Pain, Low Back Pain:  Concern for ovarian cyst Will obtain pelvic/transvaginal ultrasound Urinalysis: trace leuks RX for Ibuprofen 800 mg TID prn with food Encouraged use of heating pad Discussed the course of ovarian rupture and what to expect in case this is an ovarian cyst  Return precautions discussed Nicki Reaper, NP This visit occurred during the SARS-CoV-2 public health emergency.  Safety protocols were in place, including screening questions prior to the visit, additional usage of staff PPE, and extensive cleaning of exam room while observing appropriate contact time as indicated for disinfecting solutions.

## 2020-06-30 NOTE — Addendum Note (Signed)
Addended by: Roena Malady on: 06/30/2020 05:16 PM   Modules accepted: Orders

## 2020-07-01 ENCOUNTER — Ambulatory Visit
Admission: RE | Admit: 2020-07-01 | Discharge: 2020-07-01 | Disposition: A | Discharge status: Home / Self Care | Payer: 59 | Source: Ambulatory Visit | Attending: Internal Medicine | Admitting: Internal Medicine

## 2020-07-01 DIAGNOSIS — M545 Low back pain, unspecified: Secondary | ICD-10-CM | POA: Diagnosis present

## 2020-07-01 DIAGNOSIS — R102 Pelvic and perineal pain: Secondary | ICD-10-CM | POA: Diagnosis present

## 2020-07-07 ENCOUNTER — Telehealth: Payer: Self-pay | Admitting: Internal Medicine

## 2020-07-07 NOTE — Telephone Encounter (Signed)
Pt called stating she still has lower back and abd pain but now she is having white discharge burning with urinating. Burning during intercourse.  Can you call her in something for yeast infection or does pt need to be seen  [facility]

## 2020-07-08 NOTE — Telephone Encounter (Signed)
Patient is scheduled EM 

## 2020-07-10 ENCOUNTER — Other Ambulatory Visit: Payer: Self-pay

## 2020-07-10 ENCOUNTER — Encounter: Payer: Self-pay | Admitting: Internal Medicine

## 2020-07-10 ENCOUNTER — Other Ambulatory Visit: Payer: Self-pay | Admitting: Internal Medicine

## 2020-07-10 ENCOUNTER — Ambulatory Visit (INDEPENDENT_AMBULATORY_CARE_PROVIDER_SITE_OTHER): Payer: 59 | Admitting: Internal Medicine

## 2020-07-10 VITALS — BP 106/64 | HR 72 | Temp 98.3°F | Wt 133.0 lb

## 2020-07-10 DIAGNOSIS — N898 Other specified noninflammatory disorders of vagina: Secondary | ICD-10-CM | POA: Diagnosis not present

## 2020-07-10 DIAGNOSIS — R102 Pelvic and perineal pain: Secondary | ICD-10-CM

## 2020-07-10 DIAGNOSIS — R300 Dysuria: Secondary | ICD-10-CM | POA: Diagnosis not present

## 2020-07-10 DIAGNOSIS — M545 Low back pain, unspecified: Secondary | ICD-10-CM

## 2020-07-10 LAB — POC URINALSYSI DIPSTICK (AUTOMATED)
Bilirubin, UA: NEGATIVE
Blood, UA: NEGATIVE
Glucose, UA: NEGATIVE
Ketones, UA: NEGATIVE
Leukocytes, UA: NEGATIVE
Nitrite, UA: NEGATIVE
Protein, UA: NEGATIVE
Spec Grav, UA: 1.015 (ref 1.010–1.025)
Urobilinogen, UA: 0.2 E.U./dL
pH, UA: 7 (ref 5.0–8.0)

## 2020-07-10 MED ORDER — KETOROLAC TROMETHAMINE 30 MG/ML IJ SOLN
30.0000 mg | Freq: Once | INTRAMUSCULAR | Status: AC
  Administered 2020-07-10: 30 mg via INTRAMUSCULAR

## 2020-07-10 MED ORDER — CYCLOBENZAPRINE HCL 5 MG PO TABS
5.0000 mg | ORAL_TABLET | Freq: Every evening | ORAL | 0 refills | Status: DC | PRN
Start: 2020-07-10 — End: 2020-12-29

## 2020-07-10 NOTE — Progress Notes (Signed)
HPI  Pt presents to the clinic today with c/o dysuria, vaginal discharge, pelvic pain and low back pain. This started 3 weeks ago. She reports burning sensation with urination and sexual intercourse. She reports the vaginal discharge is thick and white, without odor. She reports vaginal itching, only with intercourse. She denies urgency, frequency, blood in her urine, painful intercourse. She is on her menses again, which is the second one this month. She does feel like her vaginal symptoms are worse around the time of her period. She describes the low back pain as sore, achy and throbbing. The pain radiates across her back, and down into her buttocks. She denies numbness, tingling, weakness or loss of bowel or bladder control. The pain is worse with movement. She denies any injury to the area. She had a normal pelvic ultrasound 07/01/20. She is sexually active.   Review of Systems  Past Medical History:  Diagnosis Date  . Allergy   . Anxiety    no meds  . Dysmenorrhea   . Dysthymic disorder   . Exposure to STD   . MRSA infection   . Sleep apnea    pt states she does not use her CPAP   . Tobacco use disorder     Family History  Problem Relation Age of Onset  . Cancer Mother 44       one breast  . Cancer Cousin 24       breast  . Anxiety disorder Other   . Cancer Sister 76       lymph node cancer twice  . Anxiety disorder Sister   . Depression Sister     Social History   Socioeconomic History  . Marital status: Divorced    Spouse name: Not on file  . Number of children: Not on file  . Years of education: Not on file  . Highest education level: Not on file  Occupational History  . Not on file  Tobacco Use  . Smoking status: Former Smoker    Types: Cigarettes    Quit date: 09/27/2018    Years since quitting: 1.7  . Smokeless tobacco: Never Used  Vaping Use  . Vaping Use: Every day  Substance and Sexual Activity  . Alcohol use: Yes    Alcohol/week: 0.0 standard drinks     Comment: occ  . 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 Sexual activity: Not Currently    Partners: Male    Birth control/protection: I.U.D.  Other Topics Concern  . Not on file  Social History Narrative   Lives with mother      Works at [employer]         Social Determinants of Health   Financial Resource Strain:   . Difficulty of Paying Living Expenses: Not on file  Food Insecurity:   . Worried About Programme researcher, broadcasting/film/video in the Last Year: Not on file  . Ran Out of Food in the Last Year: Not on file  Transportation Needs:   . Lack of Transportation (Medical): Not on file  . Lack of Transportation (Non-Medical): Not on file  Physical Activity:   . Days of Exercise per Week: Not on file  . Minutes of Exercise per Session: Not on file  Stress:   . Feeling of Stress : Not on file  Social Connections:   . Frequency of Communication with Friends and Family: Not on file  . Frequency  of Social Gatherings with Friends and Family: Not on file  . Attends Religious Services: Not on file  . Active Member of Clubs or Organizations: Not on file  . Attends Banker Meetings: Not on file  . Marital Status: Not on file  Intimate Partner Violence:   . Fear of Current or Ex-Partner: Not on file  . Emotionally Abused: Not on file  . Physically Abused: Not on file  . Sexually Abused: Not on file    No Known Allergies   Constitutional: Denies fever, malaise, fatigue, headache or abrupt weight changes.   GU: Pt reports pelvic pressure, vaginal discharge and pain with urination. Denies urgency, frequency, burning sensation, blood in urine, odor. Skin: Denies redness, rashes, lesions or ulcercations.  MSK: Pt reports low back pain. Denies decrease in range of motion, muscle pain or difficulty with gait. Neuro: Denies numbness, tingling, weakness or problems with balance and coordination.  No other specific  complaints in a complete review of systems (except as listed in HPI above).    Objective:   Physical Exam  BP 106/64   Pulse 72   Temp 98.3 F (36.8 C) (Temporal)   Wt 133 lb (60.3 kg)   LMP 07/07/2020   SpO2 98%   BMI 21.47 kg/m   Wt Readings from Last 3 Encounters:  06/30/20 132 lb (59.9 kg)  04/21/20 132 lb (59.9 kg)  03/19/20 131 lb (59.4 kg)    General: Appears her stated age, well developed, well nourished in NAD. Cardiovascular: Normal rate and rhythm. S1,S2 noted.   Pulmonary/Chest: Normal effort and positive vesicular breath sounds. No respiratory distress. No wheezes, rales or ronchi noted.  Abdomen: Soft. Normal bowel sounds. No distention or masses noted.  Tender to palpation over the bilateral pelvic area. No CVA tenderness. MSK: Normal flexion and rotation of the spine. Pain with extension and lateral bending of the spine. No bony tenderness noted over the spine. Pain with palpation over bilateral SI joints. Strength 5/5BUE/BLE. No difficulty with gait.        Assessment & Plan:   Dysuria, Pelvic Pressure, Vaginal Discharge, Vaginal Itching and Low Back Pain:  Urinalysis: normal Wet prep today Will check urine gonorrhea and chlamydia Pelvic ultrasound normal Toradol 30 mg IM today RX for Flexeril 5 mg QHS prn- sedation caution given Xray lumbar spine- she will come tomorrow to get this done if lab testing negative Drink plenty of fluids Consider follow up with GYN pending lab results  RTC as needed or if symptoms persist. Nicki Reaper, NP This visit occurred during the SARS-CoV-2 public health emergency.  Safety protocols were in place, including screening questions prior to the visit, additional usage of staff PPE, and extensive cleaning of exam room while observing appropriate contact time as indicated for disinfecting solutions.

## 2020-07-10 NOTE — Patient Instructions (Signed)
Pelvic Pain, Female Pelvic pain is pain in your lower belly (abdomen), below your belly button and between your hips. The pain may start suddenly (be acute), keep coming back (be recurring), or last a long time (become chronic). Pelvic pain that lasts longer than 6 months is called chronic pelvic pain. There are many causes of pelvic pain. Sometimes the cause of pelvic pain is not known. Follow these instructions at home:   Take over-the-counter and prescription medicines only as told by your doctor.  Rest as told by your doctor.  Do not have sex if it hurts.  Keep a journal of your pelvic pain. Write down: ? When the pain started. ? Where the pain is located. ? What seems to make the pain better or worse, such as food or your period (menstrual cycle). ? Any symptoms you have along with the pain.  Keep all follow-up visits as told by your doctor. This is important. Contact a doctor if:  Medicine does not help your pain.  Your pain comes back.  You have new symptoms.  You have unusual discharge or bleeding from your vagina.  You have a fever or chills.  You are having trouble pooping (constipation).  You have blood in your pee (urine) or poop (stool).  Your pee smells bad.  You feel weak or light-headed. Get help right away if:  You have sudden pain that is very bad.  Your pain keeps getting worse.  You have very bad pain and also have any of these symptoms: ? A fever. ? Feeling sick to your stomach (nausea). ? Throwing up (vomiting). ? Being very sweaty.  You pass out (lose consciousness). Summary  Pelvic pain is pain in your lower belly (abdomen), below your belly button and between your hips.  There are many possible causes of pelvic pain.  Keep a journal of your pelvic pain. This information is not intended to replace advice given to you by your health care provider. Make sure you discuss any questions you have with your health care provider. Document  Revised: 02/15/2018 Document Reviewed: 02/15/2018 Elsevier Patient Education  2020 [employer]  

## 2020-07-11 LAB — WET PREP BY MOLECULAR PROBE
Candida species: DETECTED — AB
MICRO NUMBER:: 11131425
SPECIMEN QUALITY:: ADEQUATE
Trichomonas vaginosis: NOT DETECTED

## 2020-07-11 LAB — C. TRACHOMATIS/N. GONORRHOEAE RNA
C. trachomatis RNA, TMA: NOT DETECTED
N. gonorrhoeae RNA, TMA: NOT DETECTED

## 2020-07-11 MED ORDER — FLUCONAZOLE 150 MG PO TABS
ORAL_TABLET | ORAL | 0 refills | Status: DC
Start: 2020-07-11 — End: 2020-08-20

## 2020-07-11 MED ORDER — METRONIDAZOLE 500 MG PO TABS
500.0000 mg | ORAL_TABLET | Freq: Two times a day (BID) | ORAL | 0 refills | Status: DC
Start: 2020-07-11 — End: 2020-08-20

## 2020-07-11 NOTE — Addendum Note (Signed)
Addended by: Lorre Munroe on: 07/11/2020 11:29 AM   Modules accepted: Orders

## 2020-08-20 ENCOUNTER — Ambulatory Visit (INDEPENDENT_AMBULATORY_CARE_PROVIDER_SITE_OTHER): Payer: 59 | Admitting: Internal Medicine

## 2020-08-20 ENCOUNTER — Other Ambulatory Visit: Payer: Self-pay

## 2020-08-20 VITALS — BP 106/70 | HR 88 | Temp 98.1°F | Wt 135.0 lb

## 2020-08-20 DIAGNOSIS — N898 Other specified noninflammatory disorders of vagina: Secondary | ICD-10-CM | POA: Diagnosis not present

## 2020-08-20 LAB — POC URINALSYSI DIPSTICK (AUTOMATED)
Bilirubin, UA: NEGATIVE
Blood, UA: NEGATIVE
Glucose, UA: NEGATIVE
Ketones, UA: NEGATIVE
Leukocytes, UA: NEGATIVE
Nitrite, UA: NEGATIVE
Protein, UA: POSITIVE — AB
Spec Grav, UA: 1.02 (ref 1.010–1.025)
Urobilinogen, UA: 0.2 E.U./dL
pH, UA: 7 (ref 5.0–8.0)

## 2020-08-20 MED ORDER — FLUCONAZOLE 150 MG PO TABS
150.0000 mg | ORAL_TABLET | Freq: Once | ORAL | 0 refills | Status: DC
Start: 2020-08-20 — End: 2020-08-20

## 2020-08-20 MED ORDER — FLUCONAZOLE 150 MG PO TABS
150.0000 mg | ORAL_TABLET | Freq: Once | ORAL | 0 refills | Status: AC
Start: 2020-08-20 — End: 2020-08-20

## 2020-08-21 ENCOUNTER — Encounter: Payer: Self-pay | Admitting: Internal Medicine

## 2020-08-21 ENCOUNTER — Other Ambulatory Visit: Payer: Self-pay | Admitting: Internal Medicine

## 2020-08-21 LAB — WET PREP BY MOLECULAR PROBE
Candida species: DETECTED — AB
MICRO NUMBER:: 11292587
SPECIMEN QUALITY:: ADEQUATE
Trichomonas vaginosis: NOT DETECTED

## 2020-08-21 LAB — C. TRACHOMATIS/N. GONORRHOEAE RNA
C. trachomatis RNA, TMA: NOT DETECTED
N. gonorrhoeae RNA, TMA: NOT DETECTED

## 2020-08-21 MED ORDER — METRONIDAZOLE 500 MG PO TABS
500.0000 mg | ORAL_TABLET | Freq: Three times a day (TID) | ORAL | 0 refills | Status: DC
Start: 2020-08-21 — End: 2020-09-27

## 2020-08-21 MED ORDER — FLUCONAZOLE 150 MG PO TABS: 150.0000 mg | ORAL_TABLET | Freq: Once | ORAL | 0 refills | Status: AC

## 2020-08-21 NOTE — Patient Instructions (Signed)

## 2020-08-21 NOTE — Progress Notes (Signed)
Subjective:    Patient ID: Taylor Bautista, female    DOB: 07-27-1991, 29 y.o.   MRN: [id number]  HPI  Pt presents to the clinic today with c/o vaginal discharge and itching. She reports symptoms started this morning. The discharge is white, without odor. She reports some associated mild abdominal pain and back pain. She did have some discomfort after intercourse but no pain with intercourse. She denies urgency, frequency, dysuria, blood in her urine or abnormal vaginal bleeding.  Review of Systems      Past Medical History:  Diagnosis Date  . Allergy   . Anxiety    no meds  . Dysmenorrhea   . Dysthymic disorder   . Exposure to STD   . MRSA infection   . Sleep apnea    pt states she does not use her CPAP   . Tobacco use disorder     Current Outpatient Medications  Medication Sig Dispense Refill  . cyanocobalamin (,VITAMIN B-12,) 1000 MCG/ML injection INJECT 1 ML (1,000 MCG TOTAL) INTO THE MUSCLE EVERY 30 (THIRTY) DAYS. STARTING 09/04/2019 3 mL 0  . cyclobenzaprine (FLEXERIL) 5 MG tablet Take 1 tablet (5 mg total) by mouth at bedtime as needed for muscle spasms. 20 tablet 0  . ibuprofen (ADVIL) 800 MG tablet Take 1 tablet (800 mg total) by mouth every 8 (eight) hours as needed. 30 tablet 2   No current facility-administered medications for this visit.    No Known Allergies  Family History  Problem Relation Age of Onset  . Cancer Mother 29       one breast  . Cancer Cousin 24       breast  . Anxiety disorder Other   . Cancer Sister 39       lymph node cancer twice  . Anxiety disorder Sister   . Depression Sister     Social History   Socioeconomic History  . Marital status: Divorced    Spouse name: Not on file  . Number of children: Not on file  . Years of education: Not on file  . Highest education level: Not on file  Occupational History  . Not on file  Tobacco Use  . Smoking status: Former Smoker    Types: Cigarettes    Quit date: 09/27/2018    Years  since quitting: 1.9  . Smokeless tobacco: Never Used  Vaping Use  . Vaping Use: Every day  Substance and Sexual Activity  . Alcohol use: Yes    Alcohol/week: 0.0 standard drinks    Comment: occ  . 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 Sexual activity: Not Currently    Partners: Male    Birth control/protection: I.U.D.  Other Topics Concern  . Not on file  Social History Narrative   Lives with mother      Works at [employer]         Social Determinants of Health   Financial Resource Strain: Not on file  Food Insecurity: Not on file  Transportation Needs: Not on file  Physical Activity: Not on file  Stress: Not on file  Social Connections: Not on file  Intimate Partner Violence: Not on file     Constitutional: Denies fever, malaise, fatigue, headache or abrupt weight changes.  Respiratory: Denies difficulty breathing, shortness of breath, cough or sputum production.   Cardiovascular: Denies chest pain, chest tightness, palpitations or swelling in the hands or feet.  Gastrointestinal: Pt reports abdominal pain. Denies bloating, constipation, diarrhea or blood in the stool.  GU: t reports vaginal discharge and itching. Denies urgency, frequency, pain with urination, burning sensation, blood in urine, odor. Musculoskeletal: Pt reports low back pain. Denies decrease in range of motion, difficulty with gait, muscle pain or joint swelling.  Skin: Denies redness, rashes, lesions or ulcercations.    No other specific complaints in a complete review of systems (except as listed in HPI above).  Objective:   Physical Exam  BP 106/70   Pulse 88   Temp 98.1 F (36.7 C) (Temporal)   Wt 135 lb (61.2 kg)   LMP 07/12/2020   BMI 21.79 kg/m  Wt Readings from Last 3 Encounters:  08/20/20 135 lb (61.2 kg)  07/10/20 133 lb (60.3 kg)  06/30/20 132 lb (59.9 kg)    General: Appears her stated age, well developed, well  nourished in NAD. Skin: Warm, dry and intact. No rashes noted. Cardiovascular: Normal rate and rhythm. Pulmonary/Chest: Normal effort and positive vesicular breath sounds. No respiratory distress. No wheezes, rales or ronchi noted.  Abdomen: Soft and mildly tender in the suprapubic area. Normal bowel sounds.  Pelvic: Self swabbed Musculoskeletal:  No difficulty with gait.  Neurological: Alert and oriented.    BMET    Component Value Date/Time   NA 140 02/01/2018 1453   K 3.7 02/01/2018 1453   CL 106 02/01/2018 1453   CO2 28 02/01/2018 1453   GLUCOSE 73 02/01/2018 1453   BUN 9 02/01/2018 1453   CREATININE 0.72 02/01/2018 1453   CALCIUM 9.4 02/01/2018 1453   GFRNONAA >60 04/28/2015 2305   GFRAA >60 04/28/2015 2305    Lipid Panel     Component Value Date/Time   CHOL 162 02/01/2018 1453   TRIG 124.0 02/01/2018 1453   HDL 48.90 02/01/2018 1453   CHOLHDL 3 02/01/2018 1453   VLDL 24.8 02/01/2018 1453   LDLCALC 88 02/01/2018 1453    CBC    Component Value Date/Time   WBC 5.9 02/01/2018 1453   RBC 4.41 02/01/2018 1453   HGB 13.9 02/01/2018 1453   HCT 40.1 02/01/2018 1453   PLT 259.0 02/01/2018 1453   MCV 91.0 02/01/2018 1453   MCH 31.0 04/28/2015 2305   MCHC 34.6 02/01/2018 1453   RDW 12.6 02/01/2018 1453   LYMPHSABS 1.6 09/23/2017 1259   MONOABS 0.5 09/23/2017 1259   EOSABS 0.3 09/23/2017 1259   BASOSABS 0.0 09/23/2017 1259    Hgb A1C No results found for: HGBA1C          Assessment & Plan:   Vaginal Discharge, Vaginal Itching:  Urinalysis: normal Will send wet prep, urine gonorrhea and chlamydia RX for Diflucan 150 mg PO x 1, repeat in 3 days  Will follow up after labs are back, return precautions discussed Nicki Reaper, NP This visit occurred during the SARS-CoV-2 public health emergency.  Safety protocols were in place, including screening questions prior to the visit, additional usage of staff PPE, and extensive cleaning of exam room while  observing appropriate contact time as indicated for disinfecting solutions.

## 2020-09-27 ENCOUNTER — Other Ambulatory Visit: Payer: Self-pay

## 2020-09-27 ENCOUNTER — Encounter: Payer: Self-pay | Admitting: Family Medicine

## 2020-09-27 ENCOUNTER — Telehealth (INDEPENDENT_AMBULATORY_CARE_PROVIDER_SITE_OTHER): Payer: 59 | Admitting: Family Medicine

## 2020-09-27 DIAGNOSIS — B373 Candidiasis of vulva and vagina: Secondary | ICD-10-CM

## 2020-09-27 DIAGNOSIS — R300 Dysuria: Secondary | ICD-10-CM | POA: Diagnosis not present

## 2020-09-27 DIAGNOSIS — B3731 Acute candidiasis of vulva and vagina: Secondary | ICD-10-CM

## 2020-09-27 MED ORDER — FLUCONAZOLE 200 MG PO TABS: 200.0000 mg | ORAL_TABLET | Freq: Every day | ORAL | 0 refills | Status: DC

## 2020-09-27 MED ORDER — SULFAMETHOXAZOLE-TRIMETHOPRIM 800-160 MG PO TABS
1.0000 | ORAL_TABLET | Freq: Two times a day (BID) | ORAL | 0 refills | Status: AC
Start: 2020-09-27 — End: 2020-10-04

## 2020-09-27 NOTE — Progress Notes (Signed)
Ryah Cribb T. Arno Cullers, MD Primary Care and Sports Medicine [facility] [address] [place], [address] Phone: [phone]  FAX: [phone]  Taylor Bautista - 30 y.o. female  MRN [id number]  Date of Birth: 02-12-1991  Visit Date: 09/27/2020  PCP: Lorre Munroe, NP  Referred by: Lorre Munroe, NP  Virtual Visit via Video Note:  I connected with  Taylor Bautista on 09/27/2020  9:40 AM EST by a video enabled telemedicine application and verified that I am speaking with the correct person using two identifiers.   Location patient: home computer, tablet, or smartphone Location provider: work or home office Consent: Verbal consent directly obtained from [facility]. Persons participating in the virtual visit: patient, provider  I discussed the limitations of evaluation and management by telemedicine and the availability of in person appointments. The patient expressed understanding and agreed to proceed.  MyChart video failure.  I had to convert to Doximity platform.  Chief Complaint  Patient presents with  . UTI/Yeast Infection    Dysuria, Itching, Vaginal Discharge started 2 days ago. Has not tried anything OTC for yeast infection.     History of Present Illness:  Having to urinate more than normal, and she is also having some pain and has some discharge.  Felt like yeast has cleared up then it has cleared up.  She has been treated more than once recently for yeast, she is also been treated for BV.  Most recently she has been given Diflucan 150 mg with a repeat in 3 days.  She is not sure if this never completely went away.  Review of Systems as above: See pertinent positives and pertinent negatives per HPI No acute distress verbally   Observations/Objective/Exam:  An attempt was made to discern vital signs over the phone and per patient if applicable and possible.   General:    Alert, Oriented, appears well and in  no acute distress  Pulmonary:     On inspection no signs of respiratory distress.  Psych / Neurological:     Pleasant and cooperative.  Assessment and Plan:    ICD-10-CM   1. Vaginal candidiasis  B37.3   2. 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  I Minna place her on some Diflucan daily for a week.  Dysuria can certainly be from UTI, so I am going to treat this as well.  Follow-up with PCP if not improved in 10 days.  Hold intercourse x10-day.  I discussed the assessment and treatment plan with the patient. The patient was provided an opportunity to ask questions and all were answered. The patient agreed with the plan and demonstrated an understanding of the instructions.   The patient was advised to call back or seek an in-person evaluation if the symptoms worsen or if the condition fails to improve as anticipated.  Follow-up: prn unless noted otherwise below No follow-ups on file.  Meds ordered this encounter  Medications  . fluconazole (DIFLUCAN) 200 MG tablet    Sig: Take 1 tablet (200 mg total) by mouth daily.    Dispense:  7 tablet    Refill:  0  . sulfamethoxazole-trimethoprim (BACTRIM DS) 800-160 MG tablet    Sig: Take 1 tablet by mouth 2 (two) times daily for 7 days.    Dispense:  14 tablet    Refill:  0   No orders of the defined types were  placed in this encounter.   Signed,  Elpidio Galea. Sayuri Rhames, MD

## 2020-10-02 ENCOUNTER — Other Ambulatory Visit: Payer: Self-pay | Admitting: Internal Medicine

## 2020-10-06 ENCOUNTER — Ambulatory Visit (INDEPENDENT_AMBULATORY_CARE_PROVIDER_SITE_OTHER): Payer: 59 | Admitting: Internal Medicine

## 2020-10-06 ENCOUNTER — Encounter: Payer: Self-pay | Admitting: Internal Medicine

## 2020-10-06 ENCOUNTER — Other Ambulatory Visit: Payer: Self-pay

## 2020-10-06 VITALS — BP 108/68 | HR 76 | Temp 97.6°F | Wt 135.0 lb

## 2020-10-06 DIAGNOSIS — R3915 Urgency of urination: Secondary | ICD-10-CM | POA: Diagnosis not present

## 2020-10-06 DIAGNOSIS — R300 Dysuria: Secondary | ICD-10-CM

## 2020-10-06 LAB — POC URINALSYSI DIPSTICK (AUTOMATED)
Bilirubin, UA: NEGATIVE
Blood, UA: NEGATIVE
Glucose, UA: POSITIVE — AB
Leukocytes, UA: NEGATIVE
Nitrite, UA: NEGATIVE
Protein, UA: POSITIVE — AB
Spec Grav, UA: >=1.03 — AB (ref 1.010–1.025)
Urobilinogen, UA: 0.2 E.U./dL
pH, UA: 5.5 (ref 5.0–8.0)

## 2020-10-06 NOTE — Patient Instructions (Signed)

## 2020-10-06 NOTE — Progress Notes (Signed)
HPI  Pt presents to the clinic today with c/o urinary urgency and dysuria. This started 12 days ago. She did an evist for the same 1/15, was treated with Bactrim and Diflucan. She reports her symptoms are better but are not fully resolved. She is sexually active.   Review of Systems  Past Medical History:  Diagnosis Date  . Allergy   . Anxiety    no meds  . Dysmenorrhea   . Dysthymic disorder   . Exposure to STD   . MRSA infection   . Sleep apnea    pt states she does not use her CPAP   . Tobacco use disorder     Family History  Problem Relation Age of Onset  . Cancer Mother 39       one breast  . Cancer Cousin 24       breast  . Anxiety disorder Other   . Cancer Sister 46       lymph node cancer twice  . Anxiety disorder Sister   . Depression Sister     Social History   Socioeconomic History  . Marital status: Divorced    Spouse name: Not on file  . Number of children: Not on file  . Years of education: Not on file  . Highest education level: Not on file  Occupational History  . Not on file  Tobacco Use  . Smoking status: Former Smoker    Types: Cigarettes    Quit date: 09/27/2018    Years since quitting: 2.0  . Smokeless tobacco: Never Used  Vaping Use  . Vaping Use: Every day  Substance and Sexual Activity  . Alcohol use: Yes    Alcohol/week: 0.0 standard drinks    Comment: occ  . 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 Sexual activity: Not Currently    Partners: Male    Birth control/protection: I.U.D.  Other Topics Concern  . Not on file  Social History Narrative   Lives with mother      Works at [employer]         Social Determinants of Health   Financial Resource Strain: Not on file  Food Insecurity: Not on file  Transportation Needs: Not on file  Physical Activity: Not on file  Stress: Not on file  Social Connections: Not on file  Intimate Partner Violence: Not on file     No Known Allergies   Constitutional: Denies fever, malaise, fatigue, headache or abrupt weight changes.   GU: Pt reports urgency, frequency and pain with urination. Denies burning sensation, blood in urine, odor or discharge. Skin: Denies redness, rashes, lesions or ulcercations.   No other specific complaints in a complete review of systems (except as listed in HPI above).    Objective:   Physical Exam  There were no vitals taken for this visit. Wt Readings from Last 3 Encounters:  08/20/20 135 lb (61.2 kg)  07/10/20 133 lb (60.3 kg)  06/30/20 132 lb (59.9 kg)    General: Appears her stated age, well developed, well nourished in NAD. Cardiovascular: Normal rate and rhythm. S1,S2 noted.   Pulmonary/Chest: Normal effort and positive vesicular breath sounds. No respiratory distress. No wheezes, rales or ronchi noted.  Abdomen: Soft. Normal bowel sounds. No distention or masses noted.  Tender to palpation over the bladder area. No CVA tenderness.        Assessment & Plan:   Urgency, Frequency,  Dysuria secondary to   Urinalysis: Will send urine culture eRx sent if for Macrobid 100 mg BID x 5 days OK to take AZO OTC Drink plenty of fluids  RTC as needed or if symptoms persist. Nicki Reaper, NP This visit occurred during the SARS-CoV-2 public health emergency.  Safety protocols were in place, including screening questions prior to the visit, additional usage of staff PPE, and extensive cleaning of exam room while observing appropriate contact time as indicated for disinfecting solutions.

## 2020-10-08 LAB — URINE CULTURE
MICRO NUMBER:: 11448971
Result:: NO GROWTH
SPECIMEN QUALITY:: ADEQUATE

## 2020-10-08 LAB — WET PREP BY MOLECULAR PROBE
Candida species: NOT DETECTED
Gardnerella vaginalis: NOT DETECTED
MICRO NUMBER:: 11448970
SPECIMEN QUALITY:: ADEQUATE
Trichomonas vaginosis: NOT DETECTED

## 2020-12-16 ENCOUNTER — Telehealth: Payer: Self-pay

## 2020-12-16 ENCOUNTER — Other Ambulatory Visit: Payer: Self-pay

## 2020-12-16 ENCOUNTER — Emergency Department (HOSPITAL_COMMUNITY)
Admission: EM | Admit: 2020-12-16 | Discharge: 2020-12-16 | Disposition: A | Discharge status: Home / Self Care | Payer: 59 | Attending: Emergency Medicine | Admitting: Emergency Medicine

## 2020-12-16 ENCOUNTER — Emergency Department (HOSPITAL_COMMUNITY): Payer: 59

## 2020-12-16 DIAGNOSIS — Z87891 Personal history of nicotine dependence: Secondary | ICD-10-CM | POA: Insufficient documentation

## 2020-12-16 DIAGNOSIS — R1031 Right lower quadrant pain: Secondary | ICD-10-CM | POA: Insufficient documentation

## 2020-12-16 DIAGNOSIS — R112 Nausea with vomiting, unspecified: Secondary | ICD-10-CM | POA: Diagnosis not present

## 2020-12-16 LAB — URINALYSIS, ROUTINE W REFLEX MICROSCOPIC
Bacteria, UA: NONE SEEN
Bilirubin Urine: NEGATIVE
Glucose, UA: NEGATIVE mg/dL
Hgb urine dipstick: NEGATIVE
Ketones, ur: 20 mg/dL — AB
Leukocytes,Ua: NEGATIVE
Nitrite: NEGATIVE
Protein, ur: NEGATIVE mg/dL
Specific Gravity, Urine: 1.019 (ref 1.005–1.030)
pH: 5 (ref 5.0–8.0)

## 2020-12-16 LAB — COMPREHENSIVE METABOLIC PANEL
ALT: 15 U/L (ref 0–44)
AST: 12 U/L — ABNORMAL LOW (ref 15–41)
Albumin: 3.7 g/dL (ref 3.5–5.0)
Alkaline Phosphatase: 38 U/L (ref 38–126)
Anion gap: 6 (ref 5–15)
BUN: 9 mg/dL (ref 6–20)
CO2: 24 mmol/L (ref 22–32)
Calcium: 9.1 mg/dL (ref 8.9–10.3)
Chloride: 110 mmol/L (ref 98–111)
Creatinine, Ser: 0.77 mg/dL (ref 0.44–1.00)
GFR, Estimated: >60 mL/min (ref >60)
Glucose, Bld: 98 mg/dL (ref 70–99)
Potassium: 3.7 mmol/L (ref 3.5–5.1)
Sodium: 140 mmol/L (ref 135–145)
Total Bilirubin: 0.9 mg/dL (ref 0.3–1.2)
Total Protein: 6.1 g/dL — ABNORMAL LOW (ref 6.5–8.1)

## 2020-12-16 LAB — CBC WITH DIFFERENTIAL/PLATELET
Abs Immature Granulocytes: 0.01 10*3/uL (ref 0.00–0.07)
Basophils Absolute: 0 10*3/uL (ref 0.0–0.1)
Basophils Relative: 1 %
Eosinophils Absolute: 0.1 10*3/uL (ref 0.0–0.5)
Eosinophils Relative: 2 %
HCT: 39.2 % (ref 36.0–46.0)
Hemoglobin: 13.5 g/dL (ref 12.0–15.0)
Immature Granulocytes: 0 %
Lymphocytes Relative: 33 %
Lymphs Abs: 1.4 10*3/uL (ref 0.7–4.0)
MCH: 31.8 pg (ref 26.0–34.0)
MCHC: 34.4 g/dL (ref 30.0–36.0)
MCV: 92.2 fL (ref 80.0–100.0)
Monocytes Absolute: 0.3 10*3/uL (ref 0.1–1.0)
Monocytes Relative: 8 %
Neutro Abs: 2.5 10*3/uL (ref 1.7–7.7)
Neutrophils Relative %: 56 %
Platelets: 285 10*3/uL (ref 150–400)
RBC: 4.25 MIL/uL (ref 3.87–5.11)
RDW: 11.9 % (ref 11.5–15.5)
WBC: 4.4 10*3/uL (ref 4.0–10.5)
nRBC: 0 % (ref 0.0–0.2)

## 2020-12-16 LAB — I-STAT BETA HCG BLOOD, ED (MC, WL, AP ONLY): I-stat hCG, quantitative: <5 m[IU]/mL (ref <5)

## 2020-12-16 LAB — LIPASE, BLOOD: Lipase: 31 U/L (ref 11–51)

## 2020-12-16 MED ORDER — ONDANSETRON HCL 4 MG/2ML IJ SOLN
4.0000 mg | Freq: Once | INTRAMUSCULAR | Status: AC
  Administered 2020-12-16: 4 mg via INTRAVENOUS
  Filled 2020-12-16: qty 2

## 2020-12-16 MED ORDER — SODIUM CHLORIDE 0.9 % IV BOLUS
1000.0000 mL | Freq: Once | INTRAVENOUS | Status: AC
  Administered 2020-12-16: 1000 mL via INTRAVENOUS

## 2020-12-16 MED ORDER — FENTANYL CITRATE (PF) 100 MCG/2ML IJ SOLN
50.0000 ug | Freq: Once | INTRAMUSCULAR | Status: AC
  Administered 2020-12-16: 50 ug via INTRAVENOUS
  Filled 2020-12-16: qty 2

## 2020-12-16 MED ORDER — DICYCLOMINE HCL 20 MG PO TABS: 20.0000 mg | ORAL_TABLET | Freq: Two times a day (BID) | ORAL | 0 refills | Status: DC

## 2020-12-16 MED ORDER — ONDANSETRON 4 MG PO TBDP: 4.0000 mg | ORAL_TABLET | Freq: Three times a day (TID) | ORAL | 0 refills | Status: DC | PRN

## 2020-12-16 MED ORDER — IOHEXOL 300 MG/ML  SOLN
100.0000 mL | Freq: Once | INTRAMUSCULAR | Status: AC | PRN
  Administered 2020-12-16: 100 mL via INTRAVENOUS

## 2020-12-16 NOTE — Telephone Encounter (Signed)
Will review ED note 

## 2020-12-16 NOTE — ED Triage Notes (Signed)
C/o lower ABD pain since Sunday. Also c/o NV. LMP ended Sunday; pt states the pain feels worse than menstrual cramps. No meds taken PTA. Last BM this am.

## 2020-12-16 NOTE — ED Provider Notes (Signed)
[facility] EMERGENCY DEPARTMENT Provider Note   CSN: [id number] Arrival date & time: 12/16/20  0930     History Chief Complaint  Patient presents with  . Abdominal Pain    Taylor Bautista is a 30 y.o. female.  The history is provided by the patient and medical records.  Abdominal Pain Associated symptoms: nausea and vomiting    30 y.o. F with hx of seasonal allergies, anxiety, sleep apnea, presenting to the ED with right lower abdominal pain.  States Sunday night she started feeling bad, nothing specific just overall stomach not feeling quite right.  Yesterday she had a lot of nausea, vomiting, a little loose stool.  She has felt hungry but also not hungry at the same time.  States pain is getting progressively worse, only on her right side.  Denies dysuria or other urinary symptoms.  She just finished menstrual cycle, normal cycle for her.  She is s/p tubal ligation but no other prior surgeries.  No meds PTA.  Past Medical History:  Diagnosis Date  . Allergy   . Anxiety    no meds  . Dysmenorrhea   . Dysthymic disorder   . Exposure to STD   . MRSA infection   . Sleep apnea    pt states she does not use her CPAP   . Tobacco use disorder     Patient Active Problem List   Diagnosis Date Noted  . B12 deficiency 12/20/2019  . Episodic paroxysmal anxiety disorder 12/26/2014  . Migraine without aura 12/05/2012    Past Surgical History:  Procedure Laterality Date  . FOOT SURGERY  2016  . LAPAROSCOPIC TUBAL LIGATION Bilateral 08/02/2018   Procedure: LAPAROSCOPIC TUBAL LIGATION - Filshie Clips;  Surgeon: [place] Bing, MD;  Location: [facility];  Service: Gynecology;  Laterality: Bilateral;  . Pigmented nevus removal  04/1989     OB History    Gravida  3   Para  2   Term  1   Preterm  1   AB  1   Living  2     SAB  0   IAB  1   Ectopic  0   Multiple  0   Live Births  2           Family History  Problem  Relation Age of Onset  . Cancer Mother 87       one breast  . Cancer Cousin 24       breast  . Anxiety disorder Other   . Cancer Sister 45       lymph node cancer twice  . Anxiety disorder Sister   . Depression Sister     Social History   Tobacco Use  . Smoking status: Former Smoker    Types: Cigarettes    Quit date: 09/27/2018    Years since quitting: 2.2  . Smokeless tobacco: Never Used  Vaping Use  . Vaping Use: Every day  Substance Use Topics  . Alcohol use: Yes    Alcohol/week: 0.0 standard drinks    Comment: occ  . 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 Authorizing Provider  cyanocobalamin (,VITAMIN B-12,) 1000 MCG/ML injection Inject 1 mL (1,000 mcg total) into the muscle every 30 (thirty) days. 10/03/20 02/01/21  Lorre Munroe, NP  cyclobenzaprine (FLEXERIL)  5 MG tablet Take 1 tablet (5 mg total) by mouth at bedtime as needed for muscle spasms. 07/10/20   Lorre Munroe, NP  fluconazole (DIFLUCAN) 200 MG tablet Take 1 tablet (200 mg total) by mouth daily. 09/27/20   Copland, Karleen Hampshire, MD  ibuprofen (ADVIL) 800 MG tablet Take 1 tablet (800 mg total) by mouth every 8 (eight) hours as needed. 06/30/20   Lorre Munroe, NP  fluticasone (FLONASE) 50 MCG/ACT nasal spray SPRAY 2 SPRAYS INTO EACH NOSTRIL EVERY DAY 11/21/19 02/13/20  Lorre Munroe, NP    Allergies    Patient has no known allergies.  Review of Systems   Review of Systems  Constitutional: Positive for appetite change.  Gastrointestinal: Positive for abdominal pain, nausea and vomiting.  All other systems reviewed and are negative.   Physical Exam Updated Vital Signs BP (!) 154/129 (BP Location: Right Arm)   Pulse 67   Temp 97.9 F (36.6 C) (Oral)   Resp 15   SpO2 99%   Physical Exam Vitals and nursing note reviewed.  Constitutional:       Appearance: She is well-developed.     Comments: Appears uncomfortable  HENT:     Head: Normocephalic and atraumatic.  Eyes:     Conjunctiva/sclera: Conjunctivae normal.     Pupils: Pupils are equal, round, and reactive to light.  Cardiovascular:     Rate and Rhythm: Normal rate and regular rhythm.     Heart sounds: Normal heart sounds.  Pulmonary:     Effort: Pulmonary effort is normal.     Breath sounds: Normal breath sounds.  Abdominal:     General: Bowel sounds are normal.     Palpations: Abdomen is soft.     Tenderness: There is abdominal tenderness in the right lower quadrant and suprapubic area. There is guarding (voluntary in RLQ). There is no rebound.  Musculoskeletal:        General: Normal range of motion.     Cervical back: Normal range of motion.  Skin:    General: Skin is warm and dry.  Neurological:     Mental Status: She is alert and oriented to person, place, and time.     ED Results / Procedures / Treatments   Labs (all labs ordered are listed, but only abnormal results are displayed) Labs Reviewed  COMPREHENSIVE METABOLIC PANEL - Abnormal; Notable for the following components:      Result Value   Total Protein 6.1 (*)    AST 12 (*)    All other components within normal limits  URINALYSIS, ROUTINE W REFLEX MICROSCOPIC - Abnormal; Notable for the following components:   APPearance HAZY (*)    Ketones, ur 20 (*)    All other components within normal limits  CBC WITH DIFFERENTIAL/PLATELET  LIPASE, BLOOD  I-STAT BETA HCG BLOOD, ED ([facility], [facility], [facility] ONLY)    EKG None  Radiology CT ABDOMEN PELVIS W CONTRAST  Result Date: 12/16/2020 CLINICAL DATA:  Acute right lower quadrant abdominal pain. EXAM: CT ABDOMEN AND PELVIS WITH CONTRAST TECHNIQUE: Multidetector CT imaging of the abdomen and pelvis was performed using the standard protocol following bolus administration of intravenous contrast. CONTRAST:  OMNIPAQUE IOHEXOL 300 MG/ML  SOLN COMPARISON:  None.  FINDINGS: Lower chest: No acute abnormality. Hepatobiliary: No focal liver abnormality is seen. No gallstones, gallbladder wall thickening, or biliary dilatation. Pancreas: Unremarkable. No pancreatic ductal dilatation or surrounding inflammatory changes. Spleen: Normal in size without focal abnormality. Adrenals/Urinary Tract: Adrenal glands are  unremarkable. Kidneys are normal, without renal calculi, focal lesion, or hydronephrosis. Bladder is unremarkable. Stomach/Bowel: Stomach is within normal limits. Appendix appears normal. No evidence of bowel wall thickening, distention, or inflammatory changes. Vascular/Lymphatic: No significant vascular findings are present. No enlarged abdominal or pelvic lymph nodes. Reproductive: Uterus and bilateral adnexa are unremarkable. Bilateral fallopian tube ligation clips are noted. Other: No abdominal wall hernia or abnormality. No abdominopelvic ascites. Musculoskeletal: No acute or significant osseous findings. IMPRESSION: No acute abnormality seen in the abdomen or pelvis. Electronically Signed   By: Lupita Raider M.D.   On: 12/16/2020 14:12    Procedures Procedures   Medications Ordered in ED Medications  fentaNYL (SUBLIMAZE) injection 50 mcg (50 mcg Intravenous Given 12/16/20 1009)  ondansetron (ZOFRAN) injection 4 mg (4 mg Intravenous Given 12/16/20 1008)  sodium chloride 0.9 % bolus 1,000 mL (0 mLs Intravenous Stopped 12/16/20 1108)  iohexol (OMNIPAQUE) 300 MG/ML solution 100 mL (100 mLs Intravenous Contrast Given 12/16/20 1340)    ED Course  I have reviewed the triage vital signs and the nursing notes.  Pertinent labs & imaging results that were available during my care of the patient were reviewed by me and considered in my medical decision making (see chart for details).    MDM Rules/Calculators/A&P  30 y.o. F here with RLQ abdominal pain.  Onset Sunday, worsening since that time.  Has had associated nausea, vomiting, decreased PO intake.  She is  afebrile, non-toxic but does appear uncomfortable.  TTP in RLQ on exam, voluntary guarding.  Plan for labs, CT, pain control.  Labs are reassuring.  CT scan is negative for any acute findings.  On reassessment, patient is resting comfortably.  States she is feeling better.  She is not having any active pain at present.  Uncertain etiology of her symptoms, may be spasms related to her vomiting and diarrhea.  We will plan to discharge home with symptomatic care and have her follow-up closely with her primary care doctor.  Final Clinical Impression(s) / ED Diagnoses Final diagnoses:  Right lower quadrant abdominal pain    Rx / DC Orders ED Discharge Orders         Ordered    dicyclomine (BENTYL) 20 MG tablet  2 times daily        12/16/20 1423    ondansetron (ZOFRAN ODT) 4 MG disintegrating tablet  Every 8 hours PRN        04 /05/22 1423           08-18-1980, PA-C 12/16/20 1425    02/15/21, MD 12/19/20 [id number]

## 2020-12-16 NOTE — Discharge Instructions (Signed)
Your labs and CT scan today were normal.  Symptoms may be related to spasm or similar from vomiting/diarrhea. Take the prescribed medication as directed.  Make sure to rest and stay hydrated. Follow-up with your primary care doctor. Return to the ED for new or worsening symptoms.

## 2020-12-16 NOTE — Telephone Encounter (Signed)
Per chart review tab pt is presently at [place]. 

## 2020-12-16 NOTE — ED Notes (Signed)
Patient transported to CT 

## 2020-12-16 NOTE — Telephone Encounter (Signed)
[facility] Day - Client TELEPHONE ADVICE RECORD [facility]urse Patient Name: Taylor Bautista Gender: Female DOB: June 17, 1991 Age: 30 Y 61 M Return Phone Number: [phone] (Primary) Address[place] [address] Client [facility] Day - Client Client Site [facility] - Day Physician Nicki Reaper - NP Contact Type Call Who Is Calling Patient / Member / Family / Caregiver Call Type Triage / Clinical Relationship To Patient Self Return Phone Number [phone] (Primary) Chief Complaint ABDOMINAL PAIN - Severe and only in abdomen Reason for Call Symptomatic / Request for Health Information Initial Comment Caller is sick. Caller states Wynelle Link was nauseous, started vomiting, having severe abd pain yesterday. Today she is still having severe, excruciating abd pain, still nauseous, no appetite. GOTO Facility Not Listed [facility] ED Translation No Nurse Assessment Nurse: Henri Medal, RN, Amy Date/Time Lamount Cohen Time): 12/16/2020 9:04:55 AM Confirm and document reason for call. If symptomatic, describe symptoms. ---Caller states that on Sunday she was nauseous & started vomiting. She was having severe abdominal pain yesterday. Today, she is still having severe, excruciating RLQ abdominal pain. It is a constant sharp pain. She rates it as a 10/10. The only time it goes away is when she is asleep. She is still nauseous & has no appetite. She has been having BM's, so she is not constipated. Her vomit was brown on Sunday. Does the patient have any new or worsening symptoms? ---Yes Will a triage be completed? ---Yes Related visit to physician within the last 2 weeks? ---No Does the PT have any chronic conditions? (i.e. diabetes, asthma, this includes High risk factors for pregnancy, etc.) ---No Is the patient pregnant or possibly pregnant? (Ask all females between the ages of 61-55) ---No Is  this a behavioral health or substance abuse call? ---No PLEASE NOTE: All timestamps contained within this report are represented as [place] Standard Time. CONFIDENTIALTY NOTICE: This fax transmission is intended only for the addressee. It contains information that is legally privileged, confidential or otherwise protected from use or disclosure. If you are not the intended recipient, you are strictly prohibited from reviewing, disclosing, copying using or disseminating any of this information or taking any action in reliance on or regarding this information. If you have received this fax in error, please notify us immediately by telephone so that we can arrange for its return to [place]. Phone: [phone], Toll-Free: [phone], Fax: [phone] Page: 2 of 2 Call Id: [id number] Guidelines Guideline Title Affirmed Question Affirmed Notes Nurse Date/Time Lamount Cohen Time) Abdominal Pain - Female [1] SEVERE pain (e.g., excruciating) AND [2] present > 1 hour Lovelace, RN, Amy 12/16/2020 9:06:25 AM Disp. Time Lamount Cohen Time) Disposition Final User 12/16/2020 9:00:25 AM Send to Urgent Queue Chalmers Guest 12/16/2020 9:10:09 AM Go to ED Now Yes Lovelace, RN, Amy Caller Disagree/Comply Comply Caller Understands Yes PreDisposition InappropriateToAsk Care Advice Given Per Guideline GO TO ED NOW: * You need to be seen in the Emergency Department. * Go to the ED at ___________ Hospital. * Leave now. Drive carefully. ANOTHER ADULT SHOULD DRIVE: * It is better and safer if another adult drives instead of you. BRING MEDICINES: * Bring a list of your current medicines when you go to the Emergency Department (ER). * Bring the pill bottles too. This will help the doctor (or NP/PA) to make certain you are taking the right medicines and the right dose. NOTHING BY MOUTH: * Do not eat or drink anything for now. CARE ADVICE  given per Abdominal Pain, Female (Adult) guideline. Referrals GO TO FACILITY OTHER -  SPECIFY

## 2020-12-29 ENCOUNTER — Ambulatory Visit (INDEPENDENT_AMBULATORY_CARE_PROVIDER_SITE_OTHER): Payer: 59 | Admitting: Primary Care

## 2020-12-29 ENCOUNTER — Other Ambulatory Visit: Payer: Self-pay

## 2020-12-29 ENCOUNTER — Encounter: Payer: Self-pay | Admitting: Primary Care

## 2020-12-29 VITALS — BP 110/58 | Temp 98.7°F | Ht 66.0 in | Wt 137.0 lb

## 2020-12-29 DIAGNOSIS — R102 Pelvic and perineal pain: Secondary | ICD-10-CM

## 2020-12-29 NOTE — Progress Notes (Signed)
Subjective:    Patient ID: Taylor Bautista, female    DOB: [birth year], 30 y.o.   MRN: [id number]  HPI  Taylor Bautista is a very pleasant 30 y.o. female patient of Nicki Reaper who presents today for ED follow up.  She presented to [facility] on 12/16/20 with a chief complaint of RLQ abdominal pain, nausea with vomiting that began a few nights prior. During her stay in the ED she underwent CT abdomen/pelvis which was negative. Labs without infection or cause for symptoms. Urine pregnancy test was negative. She was discharged home later that day with prescriptions for dicyclomine 20 mg and Zofran ODT.   Since her ED visit she continues to notice right groin/pelvic pain which is intermittent, worse with intercourse. Chronic history of intermittent pelvic cramping, underwent transvaginal ultrasound which showed trace free pelvic fluid, otherwise negative. History of tubal ligation, was on Depo Provera injections prior to tubal.   She did notice vaginal discharge (thick cottage cheese) a few days ago, also had dark brown vaginal discharge two days after evaluated in the ED. She was last treated for vaginal candidiasis in January 2022, virtual visit. No vaginal symptoms since.   Review of Systems  Constitutional: Negative for fever.  Gastrointestinal: Negative for abdominal pain and vomiting.  Genitourinary: Negative for dysuria and frequency.       Right pelvic cramping         Past Medical History:  Diagnosis Date  . Allergy   . Anxiety    no meds  . Dysmenorrhea   . Dysthymic disorder   . Exposure to STD   . MRSA infection   . Sleep apnea    pt states she does not use her CPAP   . Tobacco use disorder     Social History   Socioeconomic History  . Marital status: Divorced    Spouse name: Not on file  . Number of children: Not on file  . Years of education: Not on file  . Highest education level: Not on file  Occupational History  . Not on file  Tobacco Use  . Smoking  status: Former Smoker    Types: Cigarettes    Quit date: 09/27/2018    Years since quitting: 2.2  . Smokeless tobacco: Never Used  Vaping Use  . Vaping Use: Every day  Substance and Sexual Activity  . Alcohol use: Yes    Alcohol/week: 0.0 standard drinks    Comment: occ  . 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 Sexual activity: Not Currently    Partners: Male    Birth control/protection: I.U.D.  Other Topics Concern  . Not on file  Social History Narrative   Lives with mother      Works at [employer]         Social Determinants of Health   Financial Resource Strain: Not on file  Food Insecurity: Not on file  Transportation Needs: Not on file  Physical Activity: Not on file  Stress: Not on file  Social Connections: Not on file  Intimate Partner Violence: Not on file    Past Surgical History:  Procedure Laterality Date  . FOOT SURGERY  2016  . LAPAROSCOPIC TUBAL LIGATION Bilateral 08/02/2018   Procedure: LAPAROSCOPIC TUBAL LIGATION - Filshie Clips;  Surgeon: [place] Bing, MD;  Location: [facility];  Service: Gynecology;  Laterality: Bilateral;  . Pigmented nevus removal  04/1989  Family History  Problem Relation Age of Onset  . Cancer Mother 53       one breast  . Cancer Cousin 24       breast  . Anxiety disorder Other   . Cancer Sister 16       lymph node cancer twice  . Anxiety disorder Sister   . Depression Sister     No Known Allergies  Current Outpatient Medications on File Prior to Visit  Medication Sig Dispense Refill  . cyanocobalamin (,VITAMIN B-12,) 1000 MCG/ML injection Inject 1 mL (1,000 mcg total) into the muscle every 30 (thirty) days. 3 mL 0  . ibuprofen (ADVIL) 800 MG tablet Take 1 tablet (800 mg total) by mouth every 8 (eight) hours as needed. 30 tablet 2  . [DISCONTINUED] fluticasone (FLONASE) 50 MCG/ACT nasal spray SPRAY 2 SPRAYS INTO EACH NOSTRIL EVERY DAY  48 mL 1   No current facility-administered medications on file prior to visit.    BP (!) 110/58   Temp 98.7 F (37.1 C) (Temporal)   Ht 5\' 6"  (1.676 m)   Wt 137 lb (62.1 kg)   LMP 12/13/2020   BMI 22.11 kg/m  Objective:   Physical Exam Constitutional:      General: She is not in acute distress. Pulmonary:     Effort: Pulmonary effort is normal.  Genitourinary:    Labia:        Right: No tenderness or lesion.        Left: No tenderness or lesion.      Cervix: Discharge present. No cervical motion tenderness, erythema or cervical bleeding.     Uterus: Normal.      Adnexa: Right adnexa normal.     Comments: Scant amount of whitish vaginal discharge, no clumping or foul smell.  Neurological:     Mental Status: She is alert.           Assessment & Plan:      This visit occurred during the SARS-CoV-2 public health emergency.  Safety protocols were in place, including screening questions prior to the visit, additional usage of staff PPE, and extensive cleaning of exam room while observing appropriate contact time as indicated for disinfecting solutions.

## 2020-12-29 NOTE — Patient Instructions (Signed)
We will be in touch once we receive your lab results.   It was a pleasure meeting you!

## 2020-12-29 NOTE — Assessment & Plan Note (Signed)
Chronic, intermittent, worst flare a few weeks ago. Reviewed recent CT scan which appeared negative. Also reviewed prior vaginal ultrasound which appeared negative.   Given intermittent, ongoing symptoms recommended she touch base with GYN.   We did collect vaginal swabs today including wet prep, gonorrhea, chlamydia

## 2020-12-30 ENCOUNTER — Telehealth: Payer: Self-pay

## 2020-12-30 LAB — C. TRACHOMATIS/N. GONORRHOEAE RNA
C. trachomatis RNA, TMA: NOT DETECTED
N. gonorrhoeae RNA, TMA: NOT DETECTED

## 2020-12-30 LAB — WET PREP BY MOLECULAR PROBE
Candida species: NOT DETECTED
Gardnerella vaginalis: NOT DETECTED
MICRO NUMBER:: 11781701
SPECIMEN QUALITY:: ADEQUATE
Trichomonas vaginosis: NOT DETECTED

## 2020-12-30 NOTE — Telephone Encounter (Signed)
Patient is calling in wondering if someone is able to go over lab results, did advise Natalia Leatherwood hasnt looked at them yet.

## 2020-12-31 NOTE — Telephone Encounter (Signed)
Patient called back wanting to know about her lab results. EM

## 2020-12-31 NOTE — Telephone Encounter (Signed)
Called patient she was locked out of my chart. Have reviewed all results with her no further action needed.

## 2020-12-31 NOTE — Telephone Encounter (Signed)
Patient was already notified of lab results via My Chart yesterday. Did she have a particular question?

## 2021-01-03 ENCOUNTER — Other Ambulatory Visit: Payer: Self-pay | Admitting: Internal Medicine

## 2021-01-21 ENCOUNTER — Other Ambulatory Visit: Payer: Self-pay

## 2021-01-21 ENCOUNTER — Ambulatory Visit (INDEPENDENT_AMBULATORY_CARE_PROVIDER_SITE_OTHER): Payer: 59 | Admitting: Family Medicine

## 2021-01-21 ENCOUNTER — Other Ambulatory Visit: Payer: Self-pay | Admitting: Internal Medicine

## 2021-01-21 ENCOUNTER — Encounter: Payer: Self-pay | Admitting: Family Medicine

## 2021-01-21 VITALS — BP 90/68 | HR 69 | Temp 97.8°F | Ht 66.0 in | Wt 136.8 lb

## 2021-01-21 DIAGNOSIS — R300 Dysuria: Secondary | ICD-10-CM

## 2021-01-21 DIAGNOSIS — N898 Other specified noninflammatory disorders of vagina: Secondary | ICD-10-CM

## 2021-01-21 DIAGNOSIS — T3695XA Adverse effect of unspecified systemic antibiotic, initial encounter: Secondary | ICD-10-CM

## 2021-01-21 DIAGNOSIS — N3000 Acute cystitis without hematuria: Secondary | ICD-10-CM | POA: Diagnosis not present

## 2021-01-21 DIAGNOSIS — B379 Candidiasis, unspecified: Secondary | ICD-10-CM | POA: Diagnosis not present

## 2021-01-21 LAB — POC URINALSYSI DIPSTICK (AUTOMATED)
Bilirubin, UA: NEGATIVE
Glucose, UA: NEGATIVE
Nitrite, UA: NEGATIVE
Protein, UA: POSITIVE — AB
Spec Grav, UA: 1.025 (ref 1.010–1.025)
Urobilinogen, UA: 0.2 E.U./dL
pH, UA: 6 (ref 5.0–8.0)

## 2021-01-21 MED ORDER — FLUCONAZOLE 150 MG PO TABS: 150.0000 mg | ORAL_TABLET | Freq: Once | ORAL | 1 refills | Status: AC

## 2021-01-21 MED ORDER — NITROFURANTOIN MONOHYD MACRO 100 MG PO CAPS: 100.0000 mg | ORAL_CAPSULE | Freq: Two times a day (BID) | ORAL | 0 refills | Status: AC

## 2021-01-21 NOTE — Patient Instructions (Signed)
We will call with the results

## 2021-01-21 NOTE — Progress Notes (Signed)
Subjective:     Taylor Bautista is a 30 y.o. female presenting for Dysuria (X 3 days ), Vaginal Discharge (X 1 day ), and Urinary Urgency     Dysuria  This is a new problem. The current episode started in the past 7 days. The problem occurs every urination. The quality of the pain is described as burning. There has been no fever. She is sexually active. There is no history of pyelonephritis. Associated symptoms include a discharge and urgency. Pertinent negatives include no chills, flank pain, frequency, hematuria, hesitancy, nausea or vomiting. Associated symptoms comments: Abdominal pain. She has tried increased fluids for the symptoms. The treatment provided mild relief.  Vaginal Discharge The patient's primary symptoms include genital itching and vaginal discharge. The patient's pertinent negatives include no genital odor. This is a new problem. The current episode started in the past 7 days. Associated symptoms include dysuria and urgency. Pertinent negatives include no chills, flank pain, frequency, hematuria, nausea or vomiting. The vaginal discharge was clear and milky. There has been no bleeding. She has not been passing clots. She has not been passing tissue.     Review of Systems  Constitutional: Negative for chills.  Gastrointestinal: Negative for nausea and vomiting.  Genitourinary: Positive for dysuria, urgency and vaginal discharge. Negative for flank pain, frequency, hematuria and hesitancy.     Social History   Tobacco Use  Smoking Status Former Smoker  . Types: Cigarettes  . Quit date: 09/27/2018  . Years since quitting: 2.3  Smokeless Tobacco Never Used        Objective:    BP Readings from Last 3 Encounters:  01/21/21 90/68  12/29/20 (!) 110/58  12/16/20 107/60   Wt Readings from Last 3 Encounters:  01/21/21 136 lb 12 oz (62 kg)  12/29/20 137 lb (62.1 kg)  10/06/20 135 lb (61.2 kg)    BP 90/68   Pulse 69   Temp 97.8 F (36.6 C) (Temporal)    Ht 5\' 6"  (1.676 m)   Wt 136 lb 12 oz (62 kg)   LMP 01/11/2021 (Exact Date)   SpO2 99%   BMI 22.07 kg/m    Physical Exam Constitutional:      General: She is not in acute distress.    Appearance: She is well-developed. She is not diaphoretic.  HENT:     Right Ear: External ear normal.     Left Ear: External ear normal.  Eyes:     Conjunctiva/sclera: Conjunctivae normal.  Cardiovascular:     Rate and Rhythm: Normal rate.  Pulmonary:     Effort: Pulmonary effort is normal.  Abdominal:     General: There is no distension.     Palpations: Abdomen is soft.     Tenderness: There is no abdominal tenderness. There is no right CVA tenderness or left CVA tenderness.  Musculoskeletal:     Cervical back: Neck supple.  Skin:    General: Skin is warm and dry.     Capillary Refill: Capillary refill takes less than 2 seconds.  Neurological:     Mental Status: She is alert. Mental status is at baseline.  Psychiatric:        Mood and Affect: Mood normal.        Behavior: Behavior normal.     UA: +LE, neg nitrites      Assessment & Plan:   Problem List Items Addressed This Visit   None   Visit Diagnoses    Acute cystitis without hematuria    -  Primary   Relevant Medications   nitrofurantoin, macrocrystal-monohydrate, (MACROBID) 100 MG capsule   Other Relevant Orders   Urine Culture   Dysuria       Relevant Medications   nitrofurantoin, macrocrystal-monohydrate, (MACROBID) 100 MG capsule   Other Relevant Orders   POCT Urinalysis Dipstick (Automated) (Completed)   Antibiotic-induced yeast infection       Relevant Medications   nitrofurantoin, macrocrystal-monohydrate, (MACROBID) 100 MG capsule   fluconazole (DIFLUCAN) 150 MG tablet   Vaginal discharge       Relevant Orders   WET PREP BY MOLECULAR PROBE     UA consistent with infection. Send for culture. Treat with macrobid x 5 days Pt notes hx of yeast w/ abx treatment - fluconazole sent to pharmacy 3 days of discharge  - may be related to UTI but hx of yeast/BV - wet prep sent will plan to treat if positive.    Return if symptoms worsen or fail to improve.  Lynnda Child, MD  This visit occurred during the SARS-CoV-2 public health emergency.  Safety protocols were in place, including screening questions prior to the visit, additional usage of staff PPE, and extensive cleaning of exam room while observing appropriate contact time as indicated for disinfecting solutions.

## 2021-01-22 ENCOUNTER — Other Ambulatory Visit: Payer: Self-pay | Admitting: Family Medicine

## 2021-01-22 DIAGNOSIS — B9689 Other specified bacterial agents as the cause of diseases classified elsewhere: Secondary | ICD-10-CM

## 2021-01-22 DIAGNOSIS — N760 Acute vaginitis: Secondary | ICD-10-CM

## 2021-01-22 MED ORDER — METRONIDAZOLE 500 MG PO TABS: 500.0000 mg | ORAL_TABLET | Freq: Two times a day (BID) | ORAL | 0 refills | Status: AC

## 2021-01-23 LAB — WET PREP BY MOLECULAR PROBE
Candida species: NOT DETECTED
MICRO NUMBER:: 11877236
SPECIMEN QUALITY:: ADEQUATE
Trichomonas vaginosis: NOT DETECTED

## 2021-01-23 LAB — URINE CULTURE
MICRO NUMBER:: 11877237
SPECIMEN QUALITY:: ADEQUATE

## 2021-02-24 ENCOUNTER — Other Ambulatory Visit: Payer: Self-pay

## 2021-02-24 ENCOUNTER — Encounter: Payer: Self-pay | Admitting: Primary Care

## 2021-02-24 ENCOUNTER — Ambulatory Visit (INDEPENDENT_AMBULATORY_CARE_PROVIDER_SITE_OTHER): Payer: 59 | Admitting: Primary Care

## 2021-02-24 VITALS — BP 108/76 | HR 66 | Temp 98.3°F | Ht 66.0 in | Wt 136.0 lb

## 2021-02-24 DIAGNOSIS — B351 Tinea unguium: Secondary | ICD-10-CM | POA: Diagnosis not present

## 2021-02-24 MED ORDER — TERBINAFINE HCL 250 MG PO TABS: 250.0000 mg | ORAL_TABLET | Freq: Every day | ORAL | 0 refills | Status: AC

## 2021-02-24 NOTE — Patient Instructions (Signed)
Start terbinafine (Lamisil) 250 mg once daily for toenail fungus treatment.   Please update [place] in about 3 weeks.   It was a pleasure to see you today!

## 2021-02-24 NOTE — Assessment & Plan Note (Signed)
Evident to bilateral great toenails, has failed numerous OTC treatment.  CMP on file from April 2022 reviewed. Discussed treatment options, will trial terbinafine 250 mg once daily for 30 days.   She will update via My Chart in 3 weeks. At that point consider continued treatment but will need to recheck liver enzymes. Discussed this today.

## 2021-02-24 NOTE — Progress Notes (Signed)
Subjective:    Patient ID: Taylor Bautista, female    DOB: [birth year], 30 y.o.   MRN: [id number]  HPI  Taylor Bautista is a very pleasant 30 y.o. female patient of Nicki Reaper who presents today to discuss nail problem.  She has noticed toenail fungus to the bilateral great toenails for which she first noticed 2-3 months. She's tried numerous OTC treatments without improvement.   She swims often in the lake, pool, and ocean. Also goes frequently for pedicures. She does have some pain at times, notices the nails are brittle and cracked.    Review of Systems  Musculoskeletal:  Negative for arthralgias.  Skin:        Toenail fungus, brittle toenails         Past Medical History:  Diagnosis Date   Allergy    Anxiety    no meds   Dysmenorrhea    Dysthymic disorder    Exposure to STD    MRSA infection    Sleep apnea    pt states she does not use her CPAP    Tobacco use disorder     Social History   Socioeconomic History   Marital status: Divorced    Spouse name: Not on file   Number of children: Not on file   Years of education: Not on file   Highest education level: Not on file  Occupational History   Not on file  Tobacco Use   Smoking status: Former    Pack years: 0.00    Types: Cigarettes    Quit date: 09/27/2018    Years since quitting: 2.4   Smokeless tobacco: Never  Vaping Use   Vaping Use: Every day  Substance and Sexual Activity   Alcohol use: Yes    Alcohol/week: 0.0 standard drinks    Comment: occ   Drug use: No    Comment: MJ occasionally; 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  Other Topics Concern   Not on file  Social History Narrative   Lives with mother      Works at [employer]         Social Determinants of Health   Financial Resource Strain: Not on file  Food Insecurity: Not on file  Transportation Needs: Not on file   Physical Activity: Not on file  Stress: Not on file  Social Connections: Not on file  Intimate Partner Violence: Not on file    Past Surgical History:  Procedure Laterality Date   FOOT SURGERY  2016   LAPAROSCOPIC TUBAL LIGATION Bilateral 08/02/2018   Procedure: LAPAROSCOPIC TUBAL LIGATION - Filshie Clips;  Surgeon: [place] Bing, MD;  Location: [facility];  Service: Gynecology;  Laterality: Bilateral;   Pigmented nevus removal  04/1989    Family History  Problem Relation Age of Onset   Cancer Mother 59       one breast   Cancer Cousin 24       breast   Anxiety disorder Other    Cancer Sister 41       lymph node cancer twice   Anxiety disorder Sister    Depression Sister     No Known Allergies  Current Outpatient Medications on File Prior to Visit  Medication Sig Dispense Refill   cyanocobalamin (,VITAMIN B-12,) 1000 MCG/ML injection INJECT 1 ML (1,000 MCG TOTAL) INTO THE MUSCLE EVERY 30 DAYS.  3 mL 0   ibuprofen (ADVIL) 800 MG tablet Take 1 tablet (800 mg total) by mouth every 8 (eight) hours as needed. 30 tablet 2   [DISCONTINUED] fluticasone (FLONASE) 50 MCG/ACT nasal spray SPRAY 2 SPRAYS INTO EACH NOSTRIL EVERY DAY 48 mL 1   No current facility-administered medications on file prior to visit.    BP 108/76   Pulse 66   Temp 98.3 F (36.8 C) (Temporal)   Ht 5\' 6"  (1.676 m)   Wt 136 lb (61.7 kg)   SpO2 99%   BMI 21.95 kg/m  Objective:   Physical Exam Cardiovascular:     Rate and Rhythm: Normal rate.  Pulmonary:     Effort: Pulmonary effort is normal.  Musculoskeletal:     Cervical back: Neck supple.  Skin:    General: Skin is warm and dry.     Comments: Thickened brittle toenails to bilateral great toenails. Whitish/yellow discoloration. Left great toenail loose and attached.          Assessment & Plan:      This visit occurred during the SARS-CoV-2 public health emergency.  Safety protocols were in place, including screening  questions prior to the visit, additional usage of staff PPE, and extensive cleaning of exam room while observing appropriate contact time as indicated for disinfecting solutions.

## 2021-03-10 ENCOUNTER — Other Ambulatory Visit: Payer: Self-pay

## 2021-03-10 ENCOUNTER — Other Ambulatory Visit (HOSPITAL_COMMUNITY)
Admission: RE | Admit: 2021-03-10 | Discharge: 2021-03-10 | Disposition: A | Discharge status: Home / Self Care | Payer: 59 | Source: Ambulatory Visit | Attending: Obstetrics & Gynecology | Admitting: Obstetrics & Gynecology

## 2021-03-10 ENCOUNTER — Ambulatory Visit (INDEPENDENT_AMBULATORY_CARE_PROVIDER_SITE_OTHER): Payer: 59 | Admitting: Obstetrics & Gynecology

## 2021-03-10 ENCOUNTER — Encounter: Payer: Self-pay | Admitting: Obstetrics & Gynecology

## 2021-03-10 VITALS — BP 109/72 | HR 58 | Wt 136.6 lb

## 2021-03-10 DIAGNOSIS — N760 Acute vaginitis: Secondary | ICD-10-CM | POA: Diagnosis present

## 2021-03-10 DIAGNOSIS — B373 Candidiasis of vulva and vagina: Secondary | ICD-10-CM | POA: Diagnosis not present

## 2021-03-10 DIAGNOSIS — R300 Dysuria: Secondary | ICD-10-CM

## 2021-03-10 DIAGNOSIS — B3731 Acute candidiasis of vulva and vagina: Secondary | ICD-10-CM

## 2021-03-10 LAB — POCT URINALYSIS DIPSTICK
Blood, UA: NEGATIVE
Leukocytes, UA: NEGATIVE
Nitrite, UA: NEGATIVE
Protein, UA: NEGATIVE

## 2021-03-10 NOTE — Patient Instructions (Signed)

## 2021-03-10 NOTE — Progress Notes (Signed)
GYNECOLOGY OFFICE VISIT NOTE  History:   Taylor Bautista is a 30 y.o. [id number] here today for evaluation of vulvovaginal irritation for a few days. 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 Also reports dysuria, worried about recurrent UTI, had E.coli UTI in 5/22.  She denies any fevers, chills, back pain, N/V, bleeding or other concerns.    Past Medical History:  Diagnosis Date   Allergy    Anxiety    no meds   Dysmenorrhea    Dysthymic disorder    Exposure to STD    MRSA infection    Sleep apnea    pt states she does not use her CPAP    Tobacco use disorder     Past Surgical History:  Procedure Laterality Date   FOOT SURGERY  2016   LAPAROSCOPIC TUBAL LIGATION Bilateral 08/02/2018   Procedure: LAPAROSCOPIC TUBAL LIGATION - Filshie Clips;  Surgeon: [place] Bing, MD;  Location: [facility];  Service: Gynecology;  Laterality: Bilateral;   Pigmented nevus removal  04/1989   The following portions of the patient's history were reviewed and updated as appropriate: allergies, current medications, past family history, past medical history, past social history, past surgical history and problem list.   Health Maintenance: Normal pap and negative HRHPV on 09/17/2019.    Review of Systems:  Pertinent items noted in HPI and remainder of comprehensive ROS otherwise negative.  Physical Exam:  BP 109/72   Pulse (!) 58   Wt 136 lb 9.6 oz (62 kg)   BMI 22.05 kg/m  CONSTITUTIONAL: Well-developed, well-nourished female in no acute distress.  HEENT:  Normocephalic, atraumatic. External right and left ear normal. No scleral icterus.  NECK: Normal range of motion, supple, no masses noted on observation SKIN: No rash noted. Not diaphoretic. No erythema. No pallor. MUSCULOSKELETAL: Normal range of motion. No edema noted. NEUROLOGIC: Alert and oriented to person, place, and time. Normal muscle tone coordination.  No cranial nerve deficit noted. PSYCHIATRIC: Normal mood and affect. Normal behavior. Normal judgment and thought content. CARDIOVASCULAR: Normal heart rate noted RESPIRATORY: Effort and breath sounds normal, no problems with respiration noted ABDOMEN: No masses noted. No other overt distention noted.   PELVIC: Normal appearing external genitalia; normal urethral meatus; normal appearing distal vaginal mucosa and cervix.  Scant white discharge noted. Performed in the presence of a chaperone.  Results for orders placed or performed in visit on 03/10/21 (from the past 24 hour(s))  POCT Urinalysis Dipstick     Status: Normal   Collection Time: 03/10/21  9:11 AM  Result Value Ref Range   Color, UA     Clarity, UA     Glucose, UA     Bilirubin, UA     Ketones, UA     Spec Grav, UA     Blood, UA Negative    pH, UA     Protein, UA Negative Negative   Urobilinogen, UA     Nitrite, UA Negative    Leukocytes, UA Negative Negative   Appearance     Odor       Assessment and Plan:     1. Vulvovaginitis Proper vulvar hygiene emphasized: discussed avoidance of perfumed soaps, detergents, lotions and any type of douches; in addition to wearing cotton underwear and no underwear at night.  Also recommended cleaning front to back, voiding and cleaning up after intercourse.  - Cervicovaginal ancillary only done. If recurrent  BV, will treat with extended regimen to eradicate the Gardnerella.   2. Dysuria Likely secondary to vulvar irritation.  Udip is negative today, reassuring.   Routine preventative health maintenance measures emphasized. Please refer to After Visit Summary for other counseling recommendations.   Return for any gynecologic concerns.    I spent 15 minutes dedicated to the care of this patient including pre-visit review of records, face to face time with the patient discussing her conditions and treatments and post visit ordering of testing.    Jaynie Collins, MD,  FACOG Obstetrician & Gynecologist, [facility] for [employer], [facility]

## 2021-03-11 LAB — CERVICOVAGINAL ANCILLARY ONLY
Bacterial Vaginitis (gardnerella): NEGATIVE
Candida Glabrata: NEGATIVE
Candida Vaginitis: POSITIVE — AB
Chlamydia: NEGATIVE
Comment: NEGATIVE
Comment: NEGATIVE
Comment: NEGATIVE
Comment: NEGATIVE
Comment: NEGATIVE
Comment: NORMAL
Neisseria Gonorrhea: NEGATIVE
Trichomonas: NEGATIVE

## 2021-03-11 MED ORDER — FLUCONAZOLE 150 MG PO TABS: 150.0000 mg | ORAL_TABLET | Freq: Once | ORAL | 3 refills | Status: AC

## 2021-03-11 NOTE — Addendum Note (Signed)
Addended by: Jaynie Collins A on: 03/11/2021 02:44 PM   Modules accepted: Orders

## 2021-04-08 ENCOUNTER — Ambulatory Visit (INDEPENDENT_AMBULATORY_CARE_PROVIDER_SITE_OTHER): Payer: 59 | Admitting: Nurse Practitioner

## 2021-04-08 ENCOUNTER — Other Ambulatory Visit: Payer: Self-pay

## 2021-04-08 ENCOUNTER — Encounter: Payer: Self-pay | Admitting: Nurse Practitioner

## 2021-04-08 VITALS — BP 92/60 | HR 87 | Temp 98.3°F | Resp 12 | Ht 65.5 in | Wt 133.5 lb

## 2021-04-08 DIAGNOSIS — N760 Acute vaginitis: Secondary | ICD-10-CM | POA: Insufficient documentation

## 2021-04-08 DIAGNOSIS — Z7251 High risk heterosexual behavior: Secondary | ICD-10-CM | POA: Diagnosis not present

## 2021-04-08 NOTE — Progress Notes (Signed)
Acute Office Visit  Subjective:    Patient ID: Taylor Bautista, female    DOB: 02-21-1991, 30 y.o.   MRN: [id number]  Chief Complaint  Patient presents with   Vaginal Discharge    04/02/21, abnormal odor.      Patient is in today for abnormal vaginal odor. States she had her period Sunday. Had intercourse that day and Monday started noticing symptoms. Symptoms are burning sensation and abnormal odor. States has had bacterial vaginosis and yeast infections.  But no other STIs per report.  Patient states she just recently got treated for yeast infection by her OB/GYN Patient has mild lower abdominal pain, along with vaginal irritation and abnormal vaginal odor.  Past Medical History:  Diagnosis Date   Allergy    Anxiety    no meds   Dysmenorrhea    Dysthymic disorder    Exposure to STD    MRSA infection    Sleep apnea    pt states she does not use her CPAP    Tobacco use disorder     Past Surgical History:  Procedure Laterality Date   FOOT SURGERY  2016   LAPAROSCOPIC TUBAL LIGATION Bilateral 08/02/2018   Procedure: LAPAROSCOPIC TUBAL LIGATION - Filshie Clips;  Surgeon: [place] Bing, MD;  Location: [facility];  Service: Gynecology;  Laterality: Bilateral;   Pigmented nevus removal  04/1989    Family History  Problem Relation Age of Onset   Cancer Mother 77       one breast   Cancer Cousin 24       breast   Anxiety disorder Other    Cancer Sister 36       lymph node cancer twice   Anxiety disorder Sister    Depression Sister     Social History   Socioeconomic History   Marital status: Divorced    Spouse name: Not on file   Number of children: Not on file   Years of education: Not on file   Highest education level: Not on file  Occupational History   Not on file  Tobacco Use   Smoking status: Former    Types: Cigarettes    Quit date: 09/27/2018    Years since quitting: 2.5   Smokeless tobacco: Never  Vaping Use   Vaping Use: Every day   Substance and Sexual Activity   Alcohol use: Yes    Alcohol/week: 0.0 standard drinks    Comment: occ   Drug use: No    Comment: MJ occasionally; 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  Other Topics Concern   Not on file  Social History Narrative   Lives with mother      Works at [employer]         Social Determinants of Health   Financial Resource Strain: Not on file  Food Insecurity: Not on file  Transportation Needs: Not on file  Physical Activity: Not on file  Stress: Not on file  Social Connections: Not on file  Intimate Partner Violence: Not on file    Outpatient Medications Prior to Visit  Medication Sig Dispense Refill   cyanocobalamin (,VITAMIN B-12,) 1000 MCG/ML injection INJECT 1 ML (1,000 MCG TOTAL) INTO THE MUSCLE EVERY 30 DAYS. 3 mL 0   ibuprofen (ADVIL) 800 MG tablet Take 1 tablet (800 mg total) by mouth every 8 (eight) hours as needed. 30  tablet 2   fluticasone (FLONASE) 50 MCG/ACT nasal spray SPRAY 2 SPRAYS INTO EACH NOSTRIL EVERY DAY 48 mL 1   No facility-administered medications prior to visit.    No Known Allergies  Review of Systems  Constitutional:  Negative for chills and fever.  Respiratory:  Negative for shortness of breath.   Cardiovascular:  Negative for chest pain.  Gastrointestinal:  Positive for abdominal pain. Negative for constipation, diarrhea, nausea and vomiting.  Genitourinary:  Positive for vaginal discharge.      Objective:    Physical Exam Vitals and nursing note reviewed. Exam conducted with a chaperone present Neysa Bonito, MA).  Constitutional:      Appearance: Normal appearance.  Abdominal:     General: Bowel sounds are normal.     Palpations: Abdomen is soft.     Tenderness: There is abdominal tenderness in the left lower quadrant.    Genitourinary:    Exam position: Lithotomy position.     Pubic Area: No rash.       Vagina: Vaginal discharge present.     Cervix: Discharge present.     Comments: Discharge was thin, white, and sticky. Skin:    General: Skin is warm.  Neurological:     Mental Status: She is alert.  Psychiatric:     Comments: Sad mood.    BP 92/60   Pulse 87   Temp 98.3 F (36.8 C)   Resp 12   Ht 5' 5.5" (1.664 m)   Wt 133 lb 8 oz (60.6 kg)   LMP 04/05/2021   SpO2 98%   BMI 21.88 kg/m  Wt Readings from Last 3 Encounters:  04/08/21 133 lb 8 oz (60.6 kg)  03/10/21 136 lb 9.6 oz (62 kg)  02/24/21 136 lb (61.7 kg)    There are no preventive care reminders to display for this patient.  There are no preventive care reminders to display for this patient.   Lab Results  Component Value Date   TSH 1.64 09/23/2017   Lab Results  Component Value Date   WBC 4.4 12/16/2020   HGB 13.5 12/16/2020   HCT 39.2 12/16/2020   MCV 92.2 12/16/2020   PLT 285 12/16/2020   Lab Results  Component Value Date   NA 140 12/16/2020   K 3.7 12/16/2020   CO2 24 12/16/2020   GLUCOSE 98 12/16/2020   BUN 9 12/16/2020   CREATININE 0.77 12/16/2020   BILITOT 0.9 12/16/2020   ALKPHOS 38 12/16/2020   AST 12 (L) 12/16/2020   ALT 15 12/16/2020   PROT 6.1 (L) 12/16/2020   ALBUMIN 3.7 12/16/2020   CALCIUM 9.1 12/16/2020   ANIONGAP 6 12/16/2020   GFR 101.75 02/01/2018   Lab Results  Component Value Date   CHOL 162 02/01/2018   Lab Results  Component Value Date   HDL 48.90 02/01/2018   Lab Results  Component Value Date   LDLCALC 88 02/01/2018   Lab Results  Component Value Date   TRIG 124.0 02/01/2018   Lab Results  Component Value Date   CHOLHDL 3 02/01/2018   No results found for: HGBA1C     Assessment & Plan:   Problem List Items Addressed This Visit       Genitourinary   Acute vaginitis    Patient has history of the same.  Looking back through her chart has history of vaginitis due to yeast and bacteria.  Has been on treatment before and tolerated well.  Did  discuss waiting for swab to  come back density and appropriate treatment.  Patient acknowledged and agreed.  Did ask for urine sample today patient was unable to obtain.         Other   High risk heterosexual behavior - Primary    Found out yesterday 04-07-2021 that her fianc had slept with another person unprotected.  Patient states that he had unprotected intercourse together on Sunday, 04-05-2021.  States she noticed symptoms beginning 04-06-2021.  Per report patient had this appointment already scheduled prior to finding out about the infidelity with her fianc.       Relevant Orders   C. trachomatis/N. gonorrhoeae RNA   WET PREP BY MOLECULAR PROBE     No orders of the defined types were placed in this encounter.    Audria Nine, NP

## 2021-04-08 NOTE — Assessment & Plan Note (Signed)
Patient has history of the same.  Looking back through her chart has history of vaginitis due to yeast and bacteria.  Has been on treatment before and tolerated well.  Did discuss waiting for swab to come back density and appropriate treatment.  Patient acknowledged and agreed.  Did ask for urine sample today patient was unable to obtain.

## 2021-04-08 NOTE — Assessment & Plan Note (Signed)
Found out yesterday 04-07-2021 that her fianc had slept with another person unprotected.  Patient states that he had unprotected intercourse together on Sunday, 04-05-2021.  States she noticed symptoms beginning 04-06-2021.  Per report patient had this appointment already scheduled prior to finding out about the infidelity with her fianc.

## 2021-04-08 NOTE — Patient Instructions (Signed)
Will call when we get the result back. Watch out for fever, chills, increased abdominal pain.

## 2021-04-09 ENCOUNTER — Other Ambulatory Visit: Payer: Self-pay | Admitting: Nurse Practitioner

## 2021-04-09 DIAGNOSIS — N760 Acute vaginitis: Secondary | ICD-10-CM

## 2021-04-09 DIAGNOSIS — B9689 Other specified bacterial agents as the cause of diseases classified elsewhere: Secondary | ICD-10-CM

## 2021-04-09 LAB — WET PREP BY MOLECULAR PROBE
Candida species: NOT DETECTED
MICRO NUMBER:: 12169616
SPECIMEN QUALITY:: ADEQUATE
Trichomonas vaginosis: NOT DETECTED

## 2021-04-09 LAB — C. TRACHOMATIS/N. GONORRHOEAE RNA
C. trachomatis RNA, TMA: NOT DETECTED
N. gonorrhoeae RNA, TMA: NOT DETECTED

## 2021-04-09 MED ORDER — METRONIDAZOLE 500 MG PO TABS: 500.0000 mg | ORAL_TABLET | Freq: Two times a day (BID) | ORAL | 0 refills | Status: AC

## 2021-04-15 ENCOUNTER — Other Ambulatory Visit: Payer: Self-pay | Admitting: Nurse Practitioner

## 2021-04-15 DIAGNOSIS — B379 Candidiasis, unspecified: Secondary | ICD-10-CM

## 2021-04-15 MED ORDER — FLUCONAZOLE 150 MG PO TABS: 150.0000 mg | ORAL_TABLET | Freq: Once | ORAL | 0 refills | Status: AC

## 2021-04-25 ENCOUNTER — Other Ambulatory Visit: Payer: Self-pay | Admitting: Internal Medicine

## 2021-04-27 ENCOUNTER — Encounter: Payer: Self-pay | Admitting: Nurse Practitioner

## 2021-04-27 ENCOUNTER — Ambulatory Visit (INDEPENDENT_AMBULATORY_CARE_PROVIDER_SITE_OTHER): Payer: 59 | Admitting: Nurse Practitioner

## 2021-04-27 ENCOUNTER — Other Ambulatory Visit: Payer: Self-pay

## 2021-04-27 VITALS — BP 96/72 | HR 77 | Temp 98.3°F | Resp 18 | Ht 65.5 in | Wt 136.0 lb

## 2021-04-27 DIAGNOSIS — Z79899 Other long term (current) drug therapy: Secondary | ICD-10-CM | POA: Diagnosis not present

## 2021-04-27 DIAGNOSIS — B351 Tinea unguium: Secondary | ICD-10-CM

## 2021-04-27 DIAGNOSIS — R400 Somnolence: Secondary | ICD-10-CM | POA: Diagnosis not present

## 2021-04-27 DIAGNOSIS — E538 Deficiency of other specified B group vitamins: Secondary | ICD-10-CM | POA: Diagnosis not present

## 2021-04-27 NOTE — Assessment & Plan Note (Signed)
Patient states history of being diagnosed with sleep apnea.  Never went back and/or ordered CPAP machine.  Patient states that she does have a lots of daytime sleepiness although she does sleep at night.  Did administer Epworth Sleepiness Scale in office and she scored 19.  We will look back in chart and see if she still established patient with neurology/pulmonology, if not will refer for sleep apnea testing.

## 2021-04-27 NOTE — Patient Instructions (Signed)
We will refill the nail medication once I get the lab back. Follow up in 1 month

## 2021-04-27 NOTE — Progress Notes (Signed)
Established Patient Office Visit  Subjective:  Patient ID: Taylor Bautista, female    DOB: [birth year]  Age: 30 y.o. MRN: [id number]  CC:  Chief Complaint  Patient presents with   Nail Problem    Saw Taylor Bautista and was given Lamisil. Patient said this helped but not resolved 100%. She is aware per Taylor Bautista she would need labs re checked before getting more of this medication.    HPI Taylor Bautista presents for Transfer of care  Nail fungus: has been approx 4 weeks on treatment. Tolerated treatment well. States improving with treatment. Nails painted today.  B12 deficiency : monthly injections. Has been on approx 2 years. Normally injects on mondays. Not sure when her last lab check was.  Sleepiness: endorses daytime sleepiness. States she was evaluated in the past for sleep apnea and was dx. She never did get a CPAP machine. She was unsure as to how to get the machine.   Past Medical History:  Diagnosis Date   Allergy    Anxiety    no meds   Dysmenorrhea    Dysthymic disorder    Exposure to STD    MRSA infection    Sleep apnea    pt states she does not use her CPAP    Tobacco use disorder     Past Surgical History:  Procedure Laterality Date   FOOT SURGERY  2016   LAPAROSCOPIC TUBAL LIGATION Bilateral 08/02/2018   Procedure: LAPAROSCOPIC TUBAL LIGATION - Filshie Clips;  Surgeon: [place] Bing, MD;  Location: [facility];  Service: Gynecology;  Laterality: Bilateral;   Pigmented nevus removal  04/1989    Family History  Problem Relation Age of Onset   Cancer Mother 67       one breast   Cancer Cousin 24       breast   Anxiety disorder Other    Cancer Sister 60       lymph node cancer twice   Anxiety disorder Sister    Depression Sister     Social History   Socioeconomic History   Marital status: Divorced    Spouse name: Not on file   Number of children: Not on file   Years of education: Not on file   Highest education level: Not on file   Occupational History   Not on file  Tobacco Use   Smoking status: Former    Types: Cigarettes    Quit date: 09/27/2018    Years since quitting: 2.5   Smokeless tobacco: Never  Vaping Use   Vaping Use: Every day  Substance and Sexual Activity   Alcohol use: Yes    Alcohol/week: 0.0 standard drinks    Comment: occ   Drug use: No    Comment: MJ occasionally; 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  Other Topics Concern   Not on file  Social History Narrative   Lives with mother      Works at [employer]         Social Determinants of Health   Financial Resource Strain: Not on file  Food Insecurity: Not on file  Transportation Needs: Not on file  Physical Activity: Not on file  Stress: Not on file  Social Connections: Not on file  Intimate Partner Violence: Not on file    Outpatient Medications Prior to Visit  Medication Sig Dispense Refill  cyanocobalamin (,VITAMIN B-12,) 1000 MCG/ML injection INJECT 1 ML (1,000 MCG TOTAL) INTO THE MUSCLE EVERY 30 DAYS. 3 mL 0   ibuprofen (ADVIL) 800 MG tablet Take 1 tablet (800 mg total) by mouth every 8 (eight) hours as needed. 30 tablet 2   No facility-administered medications prior to visit.    No Known Allergies  ROS Review of Systems  Constitutional:  Negative for chills and fever.  Respiratory:  Negative for shortness of breath.   Cardiovascular:  Negative for chest pain.  Gastrointestinal:  Negative for abdominal pain, constipation, diarrhea, nausea and vomiting.  Skin:  Negative for color change and pallor.     Objective:    Physical Exam Vitals and nursing note reviewed.  Constitutional:      Appearance: Normal appearance.  Cardiovascular:     Rate and Rhythm: Normal rate and regular rhythm.  Pulmonary:     Effort: Pulmonary effort is normal.     Breath sounds: Normal breath sounds.  Abdominal:      General: Bowel sounds are normal.  Skin:    Comments: Unable to assess nail health due to having a gel coating on. Patient states it is not something you can just wipe off with polish remover  Neurological:     Mental Status: She is alert.    BP 96/72   Pulse 77   Temp 98.3 F (36.8 C)   Resp 18   Ht 5' 5.5" (1.664 m)   Wt 136 lb (61.7 kg)   LMP 04/05/2021   BMI 22.29 kg/m  Wt Readings from Last 3 Encounters:  04/27/21 136 lb (61.7 kg)  04/08/21 133 lb 8 oz (60.6 kg)  03/10/21 136 lb 9.6 oz (62 kg)     Health Maintenance Due  Topic Date Due   INFLUENZA VACCINE  04/13/2021    There are no preventive care reminders to display for this patient.  Lab Results  Component Value Date   TSH 1.64 09/23/2017   Lab Results  Component Value Date   WBC 4.4 12/16/2020   HGB 13.5 12/16/2020   HCT 39.2 12/16/2020   MCV 92.2 12/16/2020   PLT 285 12/16/2020   Lab Results  Component Value Date   NA 140 12/16/2020   K 3.7 12/16/2020   CO2 24 12/16/2020   GLUCOSE 98 12/16/2020   BUN 9 12/16/2020   CREATININE 0.77 12/16/2020   BILITOT 0.9 12/16/2020   ALKPHOS 38 12/16/2020   AST 12 (L) 12/16/2020   ALT 15 12/16/2020   PROT 6.1 (L) 12/16/2020   ALBUMIN 3.7 12/16/2020   CALCIUM 9.1 12/16/2020   ANIONGAP 6 12/16/2020   GFR 101.75 02/01/2018   Lab Results  Component Value Date   CHOL 162 02/01/2018   Lab Results  Component Value Date   HDL 48.90 02/01/2018   Lab Results  Component Value Date   LDLCALC 88 02/01/2018   Lab Results  Component Value Date   TRIG 124.0 02/01/2018   Lab Results  Component Value Date   CHOLHDL 3 02/01/2018   No results found for: HGBA1C    Assessment & Plan:   Problem List Items Addressed This Visit       Musculoskeletal and Integument   Onychomycosis    Patient was started on terbinafine 250 mg daily.  Has completed approximately 4 weeks of treatment.  Was told to return to clinic to have liver functions evaluated to  Continue  treatment.  Patient states there has been marked improvement with her  bilateral great toe toenails.  Patient had a "gel" nail covering in office today and unable to evaluate.  Did ask for the patient to have toenails not painted/done when she returns to office visit to gauge treatment status. Pending lab results we will continue to terbinafine 250 mg daily.  We will send prescription and once liver functions return.        Other   Vitamin B 12 deficiency    History of the same.  States been on the 12 injections for approximately 2 years.  Gives her self 1 injection monthly.  Unsure as to when last B12 value was drawn.  Will obtain today after office visit.  Pending lab results.  Continue B12 monthly injections.      Relevant Orders   Vitamin B12   Daytime sleepiness   Relevant Orders   TSH   Other Visit Diagnoses     High risk medication use    -  Primary   Relevant Orders   Comprehensive metabolic panel       No orders of the defined types were placed in this encounter.   Follow-up: Return in about 1 month (around 05/28/2021) for Recheck and labs.    Audria Nine, NP

## 2021-04-27 NOTE — Assessment & Plan Note (Signed)
Patient was started on terbinafine 250 mg daily.  Has completed approximately 4 weeks of treatment.  Was told to return to clinic to have liver functions evaluated to  Continue treatment.  Patient states there has been marked improvement with her bilateral great toe toenails.  Patient had a "gel" nail covering in office today and unable to evaluate.  Did ask for the patient to have toenails not painted/done when she returns to office visit to gauge treatment status. Pending lab results we will continue to terbinafine 250 mg daily.  We will send prescription and once liver functions return.

## 2021-04-27 NOTE — Telephone Encounter (Signed)
Looks like patient has seen you today and has follow up. Is she transferring to you or do I need to call her and set up a toc with Matt?

## 2021-04-27 NOTE — Telephone Encounter (Signed)
Should plan to schedule establish care with [facility]

## 2021-04-27 NOTE — Assessment & Plan Note (Signed)
History of the same.  States been on the 12 injections for approximately 2 years.  Gives her self 1 injection monthly.  Unsure as to when last B12 value was drawn.  Will obtain today after office visit.  Pending lab results.  Continue B12 monthly injections.

## 2021-04-28 ENCOUNTER — Other Ambulatory Visit: Payer: Self-pay | Admitting: Nurse Practitioner

## 2021-04-28 DIAGNOSIS — B351 Tinea unguium: Secondary | ICD-10-CM

## 2021-04-28 LAB — COMPREHENSIVE METABOLIC PANEL
ALT: 24 U/L (ref 0–35)
AST: 19 U/L (ref 0–37)
Albumin: 4.2 g/dL (ref 3.5–5.2)
Alkaline Phosphatase: 47 U/L (ref 39–117)
BUN: 13 mg/dL (ref 6–23)
CO2: 31 mEq/L (ref 19–32)
Calcium: 9.3 mg/dL (ref 8.4–10.5)
Chloride: 102 mEq/L (ref 96–112)
Creatinine, Ser: 0.72 mg/dL (ref 0.40–1.20)
GFR: 110.74 mL/min (ref >60.00)
Glucose, Bld: 76 mg/dL (ref 70–99)
Potassium: 3.7 mEq/L (ref 3.5–5.1)
Sodium: 139 mEq/L (ref 135–145)
Total Bilirubin: 0.3 mg/dL (ref 0.2–1.2)
Total Protein: 6.4 g/dL (ref 6.0–8.3)

## 2021-04-28 LAB — TSH: TSH: 2.27 u[IU]/mL (ref 0.35–5.50)

## 2021-04-28 LAB — VITAMIN B12: Vitamin B-12: 458 pg/mL (ref 211–911)

## 2021-04-28 MED ORDER — TERBINAFINE HCL 250 MG PO TABS: 250.0000 mg | ORAL_TABLET | Freq: Every day | ORAL | 0 refills | Status: DC

## 2021-05-05 ENCOUNTER — Encounter: Payer: Self-pay | Admitting: Nurse Practitioner

## 2021-05-05 ENCOUNTER — Other Ambulatory Visit: Payer: Self-pay

## 2021-05-05 ENCOUNTER — Ambulatory Visit (INDEPENDENT_AMBULATORY_CARE_PROVIDER_SITE_OTHER): Payer: 59 | Admitting: Nurse Practitioner

## 2021-05-05 VITALS — BP 104/72 | HR 80 | Temp 98.5°F | Resp 14 | Ht 65.5 in | Wt 136.5 lb

## 2021-05-05 DIAGNOSIS — R109 Unspecified abdominal pain: Secondary | ICD-10-CM | POA: Diagnosis not present

## 2021-05-05 DIAGNOSIS — N898 Other specified noninflammatory disorders of vagina: Secondary | ICD-10-CM | POA: Diagnosis not present

## 2021-05-05 DIAGNOSIS — Z7251 High risk heterosexual behavior: Secondary | ICD-10-CM

## 2021-05-05 LAB — POCT URINALYSIS DIP (CLINITEK)
Bilirubin, UA: NEGATIVE
Blood, UA: NEGATIVE
Glucose, UA: NEGATIVE mg/dL
Leukocytes, UA: NEGATIVE
Nitrite, UA: NEGATIVE
POC PROTEIN,UA: NEGATIVE
Spec Grav, UA: <=1.005 — AB (ref 1.010–1.025)
Urobilinogen, UA: 0.2 E.U./dL
pH, UA: 6 (ref 5.0–8.0)

## 2021-05-05 LAB — POCT URINE PREGNANCY: Preg Test, Ur: NEGATIVE

## 2021-05-05 NOTE — Addendum Note (Signed)
Addended by: Alvina Chou on: 05/05/2021 12:57 PM   Modules accepted: Orders

## 2021-05-05 NOTE — Addendum Note (Signed)
Addended by: Alvina Chou on: 05/05/2021 01:03 PM   Modules accepted: Orders

## 2021-05-05 NOTE — Patient Instructions (Signed)
Will call you with the test results. Hold treatment until then

## 2021-05-05 NOTE — Progress Notes (Signed)
Established Patient Office Visit  Subjective:  Patient ID: Taylor Bautista, female    DOB: [birth year]  Age: 30 y.o. MRN: [id number]  CC:  Chief Complaint  Patient presents with   Vaginal Discharge    Abdominal cramping. Feels like a BV again. Symptoms started on 05/03/21    HPI Taylor Bautista presents for abdominal cramping and  States that she feels like she has another bacterial infection. Grey/clear discharge increased than normal. Abnormal odor. Raw on the inside feeling, some itching. States that she recent changed laundry detergents and she is very sensitive to changes like that. She is established with GYN. Also has not had any sexual encounters since last office visit.   Past Medical History:  Diagnosis Date   Allergy    Anxiety    no meds   Dysmenorrhea    Dysthymic disorder    Exposure to STD    MRSA infection    Sleep apnea    pt states she does not use her CPAP    Tobacco use disorder     Past Surgical History:  Procedure Laterality Date   FOOT SURGERY  2016   LAPAROSCOPIC TUBAL LIGATION Bilateral 08/02/2018   Procedure: LAPAROSCOPIC TUBAL LIGATION - Filshie Clips;  Surgeon: Clare Bing, MD;  Location: [facility];  Service: Gynecology;  Laterality: Bilateral;   Pigmented nevus removal  04/1989    Family History  Problem Relation Age of Onset   Cancer Mother 72       one breast   Cancer Cousin 24       breast   Anxiety disorder Other    Cancer Sister 52       lymph node cancer twice   Anxiety disorder Sister    Depression Sister     Social History   Socioeconomic History   Marital status: Divorced    Spouse name: Not on file   Number of children: Not on file   Years of education: Not on file   Highest education level: Not on file  Occupational History   Not on file  Tobacco Use   Smoking status: Former    Types: Cigarettes    Quit date: 09/27/2018    Years since quitting: 2.6   Smokeless tobacco: Never  Vaping Use    Vaping Use: Every day  Substance and Sexual Activity   Alcohol use: Yes    Alcohol/week: 0.0 standard drinks    Comment: occ   Drug use: No    Comment: MJ occasionally; 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  Other Topics Concern   Not on file  Social History Narrative   Lives with mother      Works at [employer]         Social Determinants of Health   Financial Resource Strain: Not on file  Food Insecurity: Not on file  Transportation Needs: Not on file  Physical Activity: Not on file  Stress: Not on file  Social Connections: Not on file  Intimate Partner Violence: Not on file    Outpatient Medications Prior to Visit  Medication Sig Dispense Refill   cyanocobalamin (,VITAMIN B-12,) 1000 MCG/ML injection INJECT 1 ML (1,000 MCG TOTAL) INTO THE MUSCLE EVERY 30 DAYS. 3 mL 0   ibuprofen (ADVIL) 800 MG tablet Take 1 tablet (800 mg total) by mouth every 8 (eight) hours as needed.  30 tablet 2   terbinafine (LAMISIL) 250 MG tablet Take 1 tablet (250 mg total) by mouth daily. 30 tablet 0   No facility-administered medications prior to visit.    No Known Allergies  ROS Review of Systems  Constitutional:  Negative for chills and fever.  Respiratory:  Negative for shortness of breath.   Cardiovascular:  Negative for chest pain.  Gastrointestinal:  Positive for abdominal pain. Negative for nausea and vomiting.  Genitourinary:  Positive for vaginal discharge. Negative for menstrual problem and vaginal bleeding.  Skin:  Positive for rash.     Objective:    Physical Exam Vitals and nursing note reviewed. Exam conducted with a chaperone present (Anastasiya Hopkinsm, CMA).  Constitutional:      Appearance: Normal appearance.  Abdominal:     General: Abdomen is flat. Bowel sounds are normal. There is no distension.     Palpations: Abdomen is soft.     Tenderness: There  is abdominal tenderness.       Comments: Generalized lower abdomen Patient had different stages of furuncles in healing. She does not shave but does get waxed.  Genitourinary:    General: Normal vulva.     Exam position: Lithotomy position.     Pubic Area: No rash.      Vagina: Vaginal discharge present.     Cervix: No friability or erythema.     Uterus: Normal.      Adnexa:        Right: No mass or tenderness.         Left: No mass or tenderness.       Comments: Large amount of thick white discharge. Patient also had Moderate amount of clear, thick, mucous secretion at the cervix/cervical os. States she is due to start her menses in a couple days Neurological:     Mental Status: She is alert.    BP 104/72   Pulse 80   Temp 98.5 F (36.9 C)   Resp 14   Ht 5' 5.5" (1.664 m)   Wt 136 lb 8 oz (61.9 kg)   LMP 04/05/2021   SpO2 97%   BMI 22.37 kg/m  Wt Readings from Last 3 Encounters:  05/05/21 136 lb 8 oz (61.9 kg)  04/27/21 136 lb (61.7 kg)  04/08/21 133 lb 8 oz (60.6 kg)     Health Maintenance Due  Topic Date Due   Pneumococcal Vaccine 29-64 Years old (1 - PCV) Never done   INFLUENZA VACCINE  04/13/2021    There are no preventive care reminders to display for this patient.  Lab Results  Component Value Date   TSH 2.27 04/27/2021   Lab Results  Component Value Date   WBC 4.4 12/16/2020   HGB 13.5 12/16/2020   HCT 39.2 12/16/2020   MCV 92.2 12/16/2020   PLT 285 12/16/2020   Lab Results  Component Value Date   NA 139 04/27/2021   K 3.7 04/27/2021   CO2 31 04/27/2021   GLUCOSE 76 04/27/2021   BUN 13 04/27/2021   CREATININE 0.72 04/27/2021   BILITOT 0.3 04/27/2021   ALKPHOS 47 04/27/2021   AST 19 04/27/2021   ALT 24 04/27/2021   PROT 6.4 04/27/2021   ALBUMIN 4.2 04/27/2021   CALCIUM 9.3 04/27/2021   ANIONGAP 6 12/16/2020   GFR 110.74 04/27/2021   Lab Results  Component Value Date   CHOL 162 02/01/2018   Lab Results  Component Value Date   HDL  48.90 02/01/2018   Lab  Results  Component Value Date   LDLCALC 88 02/01/2018   Lab Results  Component Value Date   TRIG 124.0 02/01/2018   Lab Results  Component Value Date   CHOLHDL 3 02/01/2018   No results found for: HGBA1C    Assessment & Plan:   Problem List Items Addressed This Visit   None   No orders of the defined types were placed in this encounter.   Follow-up: Return in about 3 months (around 08/05/2021), or if symptoms worsen or fail to improve, for CPE.   This visit occurred during the SARS-CoV-2 public health emergency.  Safety protocols were in place, including screening questions prior to the visit, additional usage of staff PPE, and extensive cleaning of exam room while observing appropriate contact time as indicated for disinfecting solutions.   Audria Nine, NP

## 2021-05-05 NOTE — Assessment & Plan Note (Signed)
Just noticed recently. She thinks it could be related to her starting her cycle in a couple of days. States she is scheduled to start on 05/07/2021. UA and Upreg negative in office

## 2021-05-05 NOTE — Assessment & Plan Note (Signed)
Feels like she has another bacterial infection. Strong history of the same. Is established with GYN. Treated her recently and she states that it normally takes 2 yeast pills to get rid of medication inducted yeast infection and she only got one. Wet prep in office, pending results.

## 2021-05-05 NOTE — Assessment & Plan Note (Signed)
States that she had heard that the person her ex cheated on her with had herpes and is concerned for the same. Given exam it is unlikely but will send off blood work to verify. She has been abstinent since our last office visit.

## 2021-05-06 ENCOUNTER — Other Ambulatory Visit: Payer: Self-pay | Admitting: Nurse Practitioner

## 2021-05-06 DIAGNOSIS — B9689 Other specified bacterial agents as the cause of diseases classified elsewhere: Secondary | ICD-10-CM

## 2021-05-06 DIAGNOSIS — B379 Candidiasis, unspecified: Secondary | ICD-10-CM

## 2021-05-06 DIAGNOSIS — T3695XA Adverse effect of unspecified systemic antibiotic, initial encounter: Secondary | ICD-10-CM

## 2021-05-06 LAB — HSV(HERPES SIMPLEX VRS) I + II AB-IGG
HAV 1 IGG,TYPE SPECIFIC AB: 51.2 index — ABNORMAL HIGH
HSV 2 IGG,TYPE SPECIFIC AB: <0.9 index

## 2021-05-06 LAB — WET PREP BY MOLECULAR PROBE
Candida species: NOT DETECTED
MICRO NUMBER:: 12279627
SPECIMEN QUALITY:: ADEQUATE
Trichomonas vaginosis: NOT DETECTED

## 2021-05-06 MED ORDER — METRONIDAZOLE 500 MG PO TABS: 500.0000 mg | ORAL_TABLET | Freq: Two times a day (BID) | ORAL | 0 refills | Status: DC

## 2021-05-06 MED ORDER — METRONIDAZOLE 0.75 % VA GEL: 1.0000 | Freq: Every day | VAGINAL | 0 refills | Status: DC

## 2021-05-06 MED ORDER — FLUCONAZOLE 150 MG PO TABS: 150.0000 mg | ORAL_TABLET | Freq: Once | ORAL | 0 refills | Status: DC

## 2021-05-06 NOTE — Progress Notes (Signed)
Patient has been treated for BV often with systemic treatment. Will use local treatment. Called and canceled PO orders at her pharmacy

## 2021-05-09 LAB — HSV 1/2 AB (IGM), IFA W/RFLX TITER
HSV 1 IgM Screen: NEGATIVE
HSV 2 IgM Screen: NEGATIVE

## 2021-05-11 ENCOUNTER — Other Ambulatory Visit: Payer: Self-pay | Admitting: Nurse Practitioner

## 2021-05-11 ENCOUNTER — Telehealth: Payer: Self-pay | Admitting: Nurse Practitioner

## 2021-05-11 DIAGNOSIS — B9689 Other specified bacterial agents as the cause of diseases classified elsewhere: Secondary | ICD-10-CM

## 2021-05-11 MED ORDER — METRONIDAZOLE 0.75 % VA GEL: 1.0000 | Freq: Every day | VAGINAL | 0 refills | Status: AC

## 2021-05-11 NOTE — Telephone Encounter (Signed)
Pt called she needs a new rx on the metroNIDAZOLE (METROGEL) 0.75 % vaginal gel her mother accidentally throw it away

## 2021-05-12 NOTE — Telephone Encounter (Signed)
Patient advised.

## 2021-05-29 ENCOUNTER — Other Ambulatory Visit: Payer: Self-pay

## 2021-05-29 ENCOUNTER — Ambulatory Visit (INDEPENDENT_AMBULATORY_CARE_PROVIDER_SITE_OTHER): Payer: 59 | Admitting: Nurse Practitioner

## 2021-05-29 VITALS — BP 104/76 | HR 88 | Temp 98.3°F | Resp 14 | Ht 65.5 in | Wt 139.6 lb

## 2021-05-29 DIAGNOSIS — N760 Acute vaginitis: Secondary | ICD-10-CM

## 2021-05-29 DIAGNOSIS — R350 Frequency of micturition: Secondary | ICD-10-CM

## 2021-05-29 LAB — POCT URINALYSIS DIP (CLINITEK)
Bilirubin, UA: NEGATIVE
Blood, UA: NEGATIVE
Glucose, UA: NEGATIVE mg/dL
Ketones, POC UA: NEGATIVE mg/dL
Leukocytes, UA: NEGATIVE
Nitrite, UA: NEGATIVE
POC PROTEIN,UA: NEGATIVE
Spec Grav, UA: 1.01 (ref 1.010–1.025)
Urobilinogen, UA: 0.2 E.U./dL
pH, UA: 7.5 (ref 5.0–8.0)

## 2021-05-29 NOTE — Patient Instructions (Signed)
Nice to see you today Follow up with your GYN as soon as possible I will be in touch with the swab results

## 2021-05-29 NOTE — Assessment & Plan Note (Signed)
Patient does feel that she is urinating more frequently.  She also so states that she has been drinking quite a bit of fluids.  UA in office was normal patient did request for urine culture.  Pending lab results

## 2021-05-29 NOTE — Progress Notes (Signed)
Acute Office Visit  Subjective:    Patient ID: Taylor Bautista, female    DOB: 02-16-1991, 30 y.o.   MRN: [id number]  Chief Complaint  Patient presents with   Vaginal Discharge    Still present even after Metronidazole gel cycle. Itching, cramping.     Patient is in today for vaginal discharge  Patient states that family member threw her medication away. Was a week late starting her period. Used the cream 3 days then had her perido for 2 days and then started the cream back for an additional 3 days  Having vaginal itching, thicker white discharge. Cramping. No odor. Lower abdominal pain. Crampy. Bowels normal for her. No concern for STI as she has been abstinent.  Past Medical History:  Diagnosis Date   Allergy    Anxiety    no meds   Dysmenorrhea    Dysthymic disorder    Exposure to STD    MRSA infection    Sleep apnea    pt states she does not use her CPAP    Tobacco use disorder     Past Surgical History:  Procedure Laterality Date   FOOT SURGERY  2016   LAPAROSCOPIC TUBAL LIGATION Bilateral 08/02/2018   Procedure: LAPAROSCOPIC TUBAL LIGATION - Filshie Clips;  Surgeon: Ruhenstro[place] Bing, MD;  Location: [facility];  Service: Gynecology;  Laterality: Bilateral;   Pigmented nevus removal  04/1989    Family History  Problem Relation Age of Onset   Cancer Mother 12       one breast   Cancer Cousin 24       breast   Anxiety disorder Other    Cancer Sister 57       lymph node cancer twice   Anxiety disorder Sister    Depression Sister     Social History   Socioeconomic History   Marital status: Divorced    Spouse name: Not on file   Number of children: Not on file   Years of education: Not on file   Highest education level: Not on file  Occupational History   Not on file  Tobacco Use   Smoking status: Former    Types: Cigarettes    Quit date: 09/27/2018    Years since quitting: 2.6   Smokeless tobacco: Never  Vaping Use   Vaping Use:  Every day  Substance and Sexual Activity   Alcohol use: Yes    Alcohol/week: 0.0 standard drinks    Comment: occ   Drug use: No    Comment: MJ occasionally; 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  Other Topics Concern   Not on file  Social History Narrative   Lives with mother      Works at [employer]         Social Determinants of Health   Financial Resource Strain: Not on file  Food Insecurity: Not on file  Transportation Needs: Not on file  Physical Activity: Not on file  Stress: Not on file  Social Connections: Not on file  Intimate Partner Violence: Not on file    Outpatient Medications Prior to Visit  Medication Sig Dispense Refill   cyanocobalamin (,VITAMIN B-12,) 1000 MCG/ML injection INJECT 1 ML (1,000 MCG TOTAL) INTO THE MUSCLE EVERY 30 DAYS. 3 mL 0   ibuprofen (ADVIL) 800 MG tablet Take 1 tablet (800 mg total) by mouth every  8 (eight) hours as needed. 30 tablet 2   terbinafine (LAMISIL) 250 MG tablet Take 1 tablet (250 mg total) by mouth daily. 30 tablet 0   No facility-administered medications prior to visit.    No Known Allergies  Review of Systems  Constitutional:  Negative for chills and fever.  Respiratory:  Negative for shortness of breath.   Cardiovascular:  Negative for chest pain.  Gastrointestinal:  Positive for abdominal pain.  Genitourinary:  Positive for frequency and vaginal discharge. Negative for dysuria, hematuria, vaginal bleeding and vaginal pain.       Vaginal itching      Objective:    Physical Exam Vitals and nursing note reviewed. Exam conducted with a chaperone present [facility], CMA).  Constitutional:      Appearance: Normal appearance.  Cardiovascular:     Rate and Rhythm: Normal rate and regular rhythm.  Pulmonary:     Effort: Pulmonary effort is normal.     Breath sounds: Normal breath sounds.  Abdominal:      General: Bowel sounds are normal. There is no distension.     Palpations: There is no mass.     Tenderness: There is no abdominal tenderness.  Genitourinary:    Exam position: Lithotomy position.     Vagina: Erythema present.     Cervix: Discharge present. No friability, erythema or cervical bleeding.     Comments: White thin discharge noted in the vaginal vault Skin:    General: Skin is warm.  Neurological:     Mental Status: She is alert.  Psychiatric:        Mood and Affect: Mood normal.        Behavior: Behavior normal.        Thought Content: Thought content normal.        Judgment: Judgment normal.    BP 104/76   Pulse 88   Temp 98.3 F (36.8 C)   Resp 14   Ht 5' 5.5" (1.664 m)   Wt 139 lb 9 oz (63.3 kg)   LMP 05/18/2021   SpO2 98%   BMI 22.87 kg/m  Wt Readings from Last 3 Encounters:  05/29/21 139 lb 9 oz (63.3 kg)  05/05/21 136 lb 8 oz (61.9 kg)  04/27/21 136 lb (61.7 kg)    Health Maintenance Due  Topic Date Due   INFLUENZA VACCINE  04/13/2021    There are no preventive care reminders to display for this patient.   Lab Results  Component Value Date   TSH 2.27 04/27/2021   Lab Results  Component Value Date   WBC 4.4 12/16/2020   HGB 13.5 12/16/2020   HCT 39.2 12/16/2020   MCV 92.2 12/16/2020   PLT 285 12/16/2020   Lab Results  Component Value Date   NA 139 04/27/2021   K 3.7 04/27/2021   CO2 31 04/27/2021   GLUCOSE 76 04/27/2021   BUN 13 04/27/2021   CREATININE 0.72 04/27/2021   BILITOT 0.3 04/27/2021   ALKPHOS 47 04/27/2021   AST 19 04/27/2021   ALT 24 04/27/2021   PROT 6.4 04/27/2021   ALBUMIN 4.2 04/27/2021   CALCIUM 9.3 04/27/2021   ANIONGAP 6 12/16/2020   GFR 110.74 04/27/2021   Lab Results  Component Value Date   CHOL 162 02/01/2018   Lab Results  Component Value Date   HDL 48.90 02/01/2018   Lab Results  Component Value Date   LDLCALC 88 02/01/2018   Lab Results  Component Value Date   TRIG  124.0 02/01/2018    Lab Results  Component Value Date   CHOLHDL 3 02/01/2018   No results found for: HGBA1C     Assessment & Plan:   Problem List Items Addressed This Visit       Genitourinary   Acute vaginitis    Patient with recurrent BV and vaginitis.  Due to pelvic exam send off wet prep.  Pending lab results.  Pending treatment.  Discussed with patient that she needs to see her GYN as she keeps having recurrent vaginal discharge, BV, vaginitis.      Relevant Orders   WET PREP BY MOLECULAR PROBE     Other   Urinary frequency - Primary    Patient does feel that she is urinating more frequently.  She also so states that she has been drinking quite a bit of fluids.  UA in office was normal patient did request for urine culture.  Pending lab results      Relevant Orders   POCT URINALYSIS DIP (CLINITEK) (Completed)   Urine Culture     No orders of the defined types were placed in this encounter.  This visit occurred during the SARS-CoV-2 public health emergency.  Safety protocols were in place, including screening questions prior to the visit, additional usage of staff PPE, and extensive cleaning of exam room while observing appropriate contact time as indicated for disinfecting solutions.   Audria Nine, NP

## 2021-05-29 NOTE — Assessment & Plan Note (Signed)
Patient with recurrent BV and vaginitis.  Due to pelvic exam send off wet prep.  Pending lab results.  Pending treatment.  Discussed with patient that she needs to see her GYN as she keeps having recurrent vaginal discharge, BV, vaginitis.

## 2021-06-01 LAB — WET PREP BY MOLECULAR PROBE
Candida species: NOT DETECTED
Gardnerella vaginalis: NOT DETECTED
MICRO NUMBER:: 12384928
SPECIMEN QUALITY:: ADEQUATE
Trichomonas vaginosis: NOT DETECTED

## 2021-06-01 LAB — URINE CULTURE
MICRO NUMBER:: 12384929
Result:: NO GROWTH
SPECIMEN QUALITY:: ADEQUATE

## 2021-06-18 ENCOUNTER — Institutional Professional Consult (permissible substitution): Payer: 59 | Admitting: Internal Medicine

## 2021-06-30 ENCOUNTER — Ambulatory Visit: Payer: 59 | Admitting: Family Medicine

## 2021-07-06 ENCOUNTER — Other Ambulatory Visit: Payer: Self-pay | Admitting: Family Medicine

## 2021-07-06 ENCOUNTER — Ambulatory Visit (INDEPENDENT_AMBULATORY_CARE_PROVIDER_SITE_OTHER): Payer: 59 | Admitting: Family Medicine

## 2021-07-06 ENCOUNTER — Encounter: Payer: Self-pay | Admitting: Family Medicine

## 2021-07-06 ENCOUNTER — Other Ambulatory Visit: Payer: Self-pay

## 2021-07-06 VITALS — BP 102/78 | HR 64 | Temp 96.8°F | Ht 65.5 in | Wt 140.2 lb

## 2021-07-06 DIAGNOSIS — Z803 Family history of malignant neoplasm of breast: Secondary | ICD-10-CM

## 2021-07-06 DIAGNOSIS — B351 Tinea unguium: Secondary | ICD-10-CM

## 2021-07-06 MED ORDER — TERBINAFINE HCL 250 MG PO TABS: 250.0000 mg | ORAL_TABLET | Freq: Every day | ORAL | 1 refills | Status: DC

## 2021-07-06 NOTE — Patient Instructions (Addendum)
Toe nail fungus - continue for another 4 weeks - can continue for up to 8 weeks if needed  Breast cancer screening - will order mammogram - would recommend considering Genetic Counseling - or discuss with family members  Please call the location of your choice from the menu below to schedule your Mammogram and/or Bone Density appointment.     [facility]   Phone:  [phone]   [address]                                                                            [place], [place] [address]                                            Services: 3D Mammogram and Bone Density

## 2021-07-06 NOTE — Progress Notes (Signed)
Subjective:     Taylor Bautista is a 30 y.o. female presenting for Nail Problem (Both big toes ) and Referral (Needs order for mammogram. Pt will go to [place]. )     HPI  #toe nails - got a fungus on the nails and big toes - has been trying home remedies - the finger nails are better - has been using oral lamisil  - they are improving    #Family hx of breast cancer - updated per patient - no knowledge of + genetic screening - she thinks her mom may have been negative - discussed genetic screening referral and high risk clinic if positive - mammogram ordered - she is done having children  Family History  Problem Relation Age of Onset   Breast cancer Mother 40       initial 16, recurrence at 51   Cancer Sister 23       lymph node cancer twice   Anxiety disorder Sister    Depression Sister    Breast cancer Sister 3       stage 4   Breast cancer Sister 45   Breast cancer Sister 35   Breast cancer Cousin 24       breast   Anxiety disorder Other      Review of Systems   Social History   Tobacco Use  Smoking Status Former   Types: Cigarettes   Quit date: 09/27/2018   Years since quitting: 2.7  Smokeless Tobacco Never        Objective:    BP Readings from Last 3 Encounters:  07/06/21 102/78  05/29/21 104/76  05/05/21 104/72   Wt Readings from Last 3 Encounters:  07/06/21 140 lb 4 oz (63.6 kg)  05/29/21 139 lb 9 oz (63.3 kg)  05/05/21 136 lb 8 oz (61.9 kg)    BP 102/78   Pulse 64   Temp (!) 96.8 F (36 C) (Temporal)   Ht 5' 5.5" (1.664 m)   Wt 140 lb 4 oz (63.6 kg)   SpO2 99%   BMI 22.98 kg/m    Physical Exam Constitutional:      General: She is not in acute distress.    Appearance: She is well-developed. She is not diaphoretic.  HENT:     Right Ear: External ear normal.     Left Ear: External ear normal.  Eyes:     Conjunctiva/sclera: Conjunctivae normal.  Cardiovascular:     Rate and Rhythm: Normal rate.  Pulmonary:      Effort: Pulmonary effort is normal.  Musculoskeletal:     Cervical back: Neck supple.  Skin:    General: Skin is warm and dry.     Capillary Refill: Capillary refill takes less than 2 seconds.     Comments: Nail polish on her nails. She has photos of resolving yellow discolored great toes b/l  Neurological:     Mental Status: She is alert. Mental status is at baseline.  Psychiatric:        Mood and Affect: Mood normal.        Behavior: Behavior normal.          Assessment & Plan:   Problem List Items Addressed This Visit       Musculoskeletal and Integument   Onychomycosis - Primary    Normal labs. Response to treatment. Has completed 8 weeks. Discussed continuing for up to another 8 weeks if needed - though 12 may be sufficient.  Relevant Medications   terbinafine (LAMISIL) 250 MG tablet     Other   Family history of breast cancer in sister    High risk with mom and sister with confirmed breast cancer (mom initially at 23, sister 20). Also with 2 sisters getting worked up for abnormal mammogram both in the their 30s. She will check with family about genetic testing. Advised referral for genetic counseling even if their initial testing was negative to see if additional tests would be warranted.       Relevant Orders   MM Digital Screening   I spent >15 minutes with pt , obtaining history, examining, reviewing chart, documenting encounter and discussing the above plan of care.   Return if symptoms worsen or fail to improve.  Lynnda Child, MD  This visit occurred during the SARS-CoV-2 public health emergency.  Safety protocols were in place, including screening questions prior to the visit, additional usage of staff PPE, and extensive cleaning of exam room while observing appropriate contact time as indicated for disinfecting solutions.

## 2021-07-06 NOTE — Assessment & Plan Note (Signed)
High risk with mom and sister with confirmed breast cancer (mom initially at 47, sister 73). Also with 2 sisters getting worked up for abnormal mammogram both in the their 30s. She will check with family about genetic testing. Advised referral for genetic counseling even if their initial testing was negative to see if additional tests would be warranted.

## 2021-07-06 NOTE — Assessment & Plan Note (Signed)
Normal labs. Response to treatment. Has completed 8 weeks. Discussed continuing for up to another 8 weeks if needed - though 12 may be sufficient.

## 2021-07-13 ENCOUNTER — Other Ambulatory Visit: Payer: Self-pay | Admitting: *Deleted

## 2021-07-13 MED ORDER — FLUCONAZOLE 150 MG PO TABS: 150.0000 mg | ORAL_TABLET | Freq: Once | ORAL | 3 refills | Status: AC

## 2021-07-16 ENCOUNTER — Telehealth: Payer: Self-pay

## 2021-07-16 ENCOUNTER — Encounter: Payer: Self-pay | Admitting: Emergency Medicine

## 2021-07-16 ENCOUNTER — Emergency Department: Payer: 59

## 2021-07-16 ENCOUNTER — Other Ambulatory Visit: Payer: Self-pay

## 2021-07-16 ENCOUNTER — Emergency Department
Admission: EM | Admit: 2021-07-16 | Discharge: 2021-07-16 | Disposition: A | Discharge status: Home / Self Care | Payer: 59 | Attending: Emergency Medicine | Admitting: Emergency Medicine

## 2021-07-16 DIAGNOSIS — R1032 Left lower quadrant pain: Secondary | ICD-10-CM

## 2021-07-16 DIAGNOSIS — R109 Unspecified abdominal pain: Secondary | ICD-10-CM | POA: Diagnosis present

## 2021-07-16 DIAGNOSIS — Z87891 Personal history of nicotine dependence: Secondary | ICD-10-CM | POA: Insufficient documentation

## 2021-07-16 DIAGNOSIS — N83291 Other ovarian cyst, right side: Secondary | ICD-10-CM | POA: Insufficient documentation

## 2021-07-16 LAB — URINALYSIS, ROUTINE W REFLEX MICROSCOPIC
Bilirubin Urine: NEGATIVE
Glucose, UA: NEGATIVE mg/dL
Hgb urine dipstick: NEGATIVE
Ketones, ur: NEGATIVE mg/dL
Nitrite: NEGATIVE
Protein, ur: NEGATIVE mg/dL
Specific Gravity, Urine: 1.006 (ref 1.005–1.030)
pH: 7 (ref 5.0–8.0)

## 2021-07-16 LAB — CBC
HCT: 37.6 % (ref 36.0–46.0)
Hemoglobin: 13 g/dL (ref 12.0–15.0)
MCH: 32 pg (ref 26.0–34.0)
MCHC: 34.6 g/dL (ref 30.0–36.0)
MCV: 92.6 fL (ref 80.0–100.0)
Platelets: 288 10*3/uL (ref 150–400)
RBC: 4.06 MIL/uL (ref 3.87–5.11)
RDW: 11.7 % (ref 11.5–15.5)
WBC: 5.9 10*3/uL (ref 4.0–10.5)
nRBC: 0 % (ref 0.0–0.2)

## 2021-07-16 LAB — WET PREP, GENITAL
Clue Cells Wet Prep HPF POC: NONE SEEN
Trich, Wet Prep: NONE SEEN
Yeast Wet Prep HPF POC: NONE SEEN

## 2021-07-16 LAB — COMPREHENSIVE METABOLIC PANEL
ALT: 22 U/L (ref 0–44)
AST: 18 U/L (ref 15–41)
Albumin: 4 g/dL (ref 3.5–5.0)
Alkaline Phosphatase: 58 U/L (ref 38–126)
Anion gap: 5 (ref 5–15)
BUN: 12 mg/dL (ref 6–20)
CO2: 29 mmol/L (ref 22–32)
Calcium: 9.4 mg/dL (ref 8.9–10.3)
Chloride: 105 mmol/L (ref 98–111)
Creatinine, Ser: 0.58 mg/dL (ref 0.44–1.00)
GFR, Estimated: >60 mL/min (ref >60)
Glucose, Bld: 81 mg/dL (ref 70–99)
Potassium: 3.9 mmol/L (ref 3.5–5.1)
Sodium: 139 mmol/L (ref 135–145)
Total Bilirubin: 0.7 mg/dL (ref 0.3–1.2)
Total Protein: 7 g/dL (ref 6.5–8.1)

## 2021-07-16 LAB — CHLAMYDIA/NGC RT PCR (ARMC ONLY)
Chlamydia Tr: NOT DETECTED
N gonorrhoeae: NOT DETECTED

## 2021-07-16 LAB — POC URINE PREG, ED: Preg Test, Ur: NEGATIVE

## 2021-07-16 LAB — LIPASE, BLOOD: Lipase: 37 U/L (ref 11–51)

## 2021-07-16 MED ORDER — ACETAMINOPHEN 500 MG PO TABS
1000.0000 mg | ORAL_TABLET | Freq: Once | ORAL | Status: AC
  Administered 2021-07-16: 1000 mg via ORAL
  Filled 2021-07-16: qty 2

## 2021-07-16 MED ORDER — IOHEXOL 300 MG/ML  SOLN
100.0000 mL | Freq: Once | INTRAMUSCULAR | Status: AC | PRN
  Administered 2021-07-16: 100 mL via INTRAVENOUS

## 2021-07-16 MED ORDER — DICYCLOMINE HCL 20 MG PO TABS: 20.0000 mg | ORAL_TABLET | Freq: Three times a day (TID) | ORAL | 0 refills | Status: DC

## 2021-07-16 MED ORDER — KETOROLAC TROMETHAMINE 30 MG/ML IJ SOLN
15.0000 mg | Freq: Once | INTRAMUSCULAR | Status: AC
  Administered 2021-07-16: 15 mg via INTRAVENOUS
  Filled 2021-07-16: qty 1

## 2021-07-16 NOTE — Telephone Encounter (Signed)
[facility] Day - Client TELEPHONE ADVICE RECORD AccessNurse Patient Name: Taylor Bautista Gender: Female DOB: June 11, 1991 Age: 30 Y 5 M 29 D Return Phone Number: [phone] (Primary) Address: [place]  [address] Client [facility] Day - Client Client Site [facility] - Day Physician Audria Nine- NP Contact Type Call Who Is Calling Patient / Member / Family / Caregiver Call Type Triage / Clinical Relationship To Patient Self Return Phone Number [phone] (Primary) Chief Complaint Abdominal Pain Reason for Call Symptomatic / Request for Health Information Initial Comment Caller states they have patient that needs triage. Last 3 days Bad abdominal pain that comes and goes throughout the day Translation No Nurse Assessment Nurse: Dolores Frame, RN, Celeste Date/Time (Eastern Time): 07/16/2021 11:07:01 AM Confirm and document reason for call. If symptomatic, describe symptoms. ---Caller states she has abdominal pain that comes and goes throughout the day x3 days. denies fever Does the patient have any new or worsening symptoms? ---Yes Will a triage be completed? ---Yes Related visit to physician within the last 2 weeks? ---No Does the PT have any chronic conditions? (i.e. diabetes, asthma, this includes High risk factors for pregnancy, etc.) ---Yes List chronic conditions. ---b12 deficiency Is the patient pregnant or possibly pregnant? (Ask all females between the ages of 19-55) ---No Is this a behavioral health or substance abuse call? ---No Guidelines Guideline Title Affirmed Question Affirmed Notes Nurse Date/Time (Eastern Time) Abdominal Pain - Female [1] SEVERE pain (e.g., excruciating) AND [2] present > 1 hour Pitre, RN, Celeste 07/16/2021 11:08:13 AM Disp. Time Lamount Cohen Time) Disposition Final User 07/16/2021 11:16:10 AM Called On-Call Provider Pitre, RN,  Celeste PLEASE NOTE: All timestamps contained within this report are represented as [place] Standard Time. CONFIDENTIALTY NOTICE: This fax transmission is intended only for the addressee. It contains information that is legally privileged, confidential or otherwise protected from use or disclosure. If you are not the intended recipient, you are strictly prohibited from reviewing, disclosing, copying using or disseminating any of this information or taking any action in reliance on or regarding this information. If you have received this fax in error, please notify us immediately by telephone so that we can arrange for its return to [place]. Phone: [phone], Toll-Free: [phone], Fax: [phone] Page: 2 of 2 Call Id: [id number] 07/16/2021 11:17:33 AM Go to ED Now Yes Dolores Frame, RN, Celeste Caller Disagree/Comply Disagree Caller Understands Yes PreDisposition InappropriateToAsk Care Advice Given Per Guideline GO TO ED NOW: * You need to be seen in the Emergency Department. * Go to the ED at ___________ Hospital. * Leave now. Drive carefully. ANOTHER ADULT SHOULD DRIVE: * It is better and safer if another adult drives instead of you. NOTHING BY MOUTH: * Do not eat or drink anything for now. CARE ADVICE given per Abdominal Pain, Female (Adult) guideline. Comments User: Silverio Lay, RN Date/Time Lamount Cohen Time): 07/16/2021 11:11:01 AM pt at work and unable to leave. has apt at the office at 2 User: Silverio Lay, RN Date/Time Lamount Cohen Time): 07/16/2021 11:17:50 AM caller v/u Referrals GO TO FACILITY REFUSED Paging DoctorName Phone DateTime Result/ Outcome Message Type Notes backline 07/16/2021 11:16:10 AM Called On Call Provider - Reached Doctor Paged backline 07/16/2021 11:16:59 AM Spoke with Office - General Message Result notified of pt ed outcome and refusal. Office states her apt is tomorrow and she does not have any openings available for today. RN will notify caller of ED  outcome recommendatio

## 2021-07-16 NOTE — Telephone Encounter (Signed)
I spoke with pt and she talked with access nurse again and was advised that her appt with Audria Nine NP was not until 07/17/21; Pt is going to go to [facility] ED shortly due to severe abd pain. Pt asked me to cancel 07/17/21 appt with Audria Nine NP and I did that;sending note to Audria Nine NP as Lorain Childes.

## 2021-07-16 NOTE — Discharge Instructions (Addendum)
Your CT scan was overall reassuring.  Were going to start you on some medicine that helps with irritable bowel syndrome at this could be from intestinal spasming.  You should take a food journal to see if anything is causing the pain.  You should call the GI doctor to get a follow-up schedule.  Return to ER if develop fevers, worsening pain or any other concern  You can take Tylenol 1 g every 8 hours and ibuprofen 800 every 6-8 hours with food for one week    IMPRESSION: Normal pelvic ultrasound. Follicular cyst in the right ovary measuring 2.1 cm is physiologic in a patient of this age. No follow-up imaging or further characterization is needed.

## 2021-07-16 NOTE — ED Triage Notes (Signed)
Pt comes into the ED via [facility] c/o LLQ abd pain x 3 days.  P{t denies any N/V/D.  Pt in NAd with even and unlabored respirations.

## 2021-07-16 NOTE — ED Provider Notes (Signed)
[facility] Emergency Department Provider Note  ____________________________________________   Event Date/Time   First MD Initiated Contact with Patient 07/16/21 1617     (approximate)  I have reviewed the triage vital signs and the nursing notes.   HISTORY  Chief Complaint Abdominal Pain    HPI Taylor Bautista is a 30 y.o. female who comes in with abdominal pain, 3 days ago, intermittent cramp and now more severe, dull aches with radiation to the low back. The pain is better with sitting. Nothing else makes it worse. Pain is 10/10. Denies fever, headache, chest pain, nausea vomiting. No irregular bowel movements. No symptoms uti. No h/o kidney stones/cyst. Lmp October 20.  Patient reports being sexually active with 1 partner who she has been with for 2 years.  Denies any vaginal symptoms or concerns for STDs          Past Medical History:  Diagnosis Date   Allergy    Anxiety    no meds   Dysmenorrhea    Dysthymic disorder    Exposure to STD    MRSA infection    Sleep apnea    pt states she does not use her CPAP    Tobacco use disorder     Patient Active Problem List   Diagnosis Date Noted   Family history of breast cancer in sister 07/06/2021   Abdominal cramping 05/05/2021   Daytime sleepiness 04/27/2021   High risk heterosexual behavior 04/08/2021   Acute vaginitis 04/08/2021   Onychomycosis 02/24/2021   Vitamin B 12 deficiency 12/20/2019   Pelvic pain 10/31/2019   Urinary frequency 10/21/2016   Vaginal discharge 10/19/2016   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 Authorizing Provider  cyanocobalamin (,VITAMIN B-12,) 1000 MCG/ML injection INJECT 1 ML (1,000 MCG TOTAL) INTO THE MUSCLE EVERY 30 DAYS. 04/27/21   Lynnda Child, MD  ibuprofen (ADVIL) 800 MG tablet Take 1 tablet (800 mg total) by mouth every 8 (eight) hours as needed. 06/30/20   Lorre Munroe, NP  terbinafine (LAMISIL) 250 MG tablet Take 1 tablet (250 mg total) by mouth daily. 07/06/21   Lynnda Child, MD    Allergies Patient has no known allergies.  Family History  Problem Relation Age of Onset   Breast cancer Mother 60       initial 18, recurrence at 7   Cancer Sister 23       lymph node cancer twice   Anxiety disorder Sister    Depression Sister    Breast cancer Sister 110       stage 4   Breast cancer Sister 58   Breast cancer Sister 41   Breast cancer Cousin 24       breast   Anxiety disorder Other     Social History Social History   Tobacco Use   Smoking status: Former    Types: Cigarettes    Quit date: 09/27/2018    Years since quitting: 2.8   Smokeless tobacco: Never  Vaping Use   Vaping Use:  Every day  Substance Use Topics   Alcohol use: Yes    Alcohol/week: 0.0 standard drinks    Comment: occ   Drug use: No    Comment: MJ occasionally; 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 ENT: No sore throat. Cardiovascular: Denies chest pain. Respiratory: Denies shortness of breath. Gastrointestinal:+ abd pain. No nausea, no vomiting.  No diarrhea.  No constipation. Genitourinary: Negative for dysuria. Musculoskeletal: Negative for back pain. Skin: Negative for rash. Neurological: Negative for headaches, focal weakness or numbness. All other ROS negative ____________________________________________   PHYSICAL EXAM:  VITAL SIGNS: ED Triage Vitals  Enc Vitals Group     BP  07/16/21 1502 (!) 110/92     Pulse Rate 07/16/21 1502 73     Resp 07/16/21 1502 19     Temp 07/16/21 1502 98.9 F (37.2 C)     Temp Source 07/16/21 1502 Oral     SpO2 07/16/21 1502 100 %     Weight 07/16/21 1418 140 lb 4 oz (63.6 kg)     Height 07/16/21 1418 5' 5.5" (1.664 m)     Head Circumference --      Peak Flow --      Pain Score 07/16/21 1418 10     Pain Loc --      Pain Edu? --      Excl. in GC? --     Constitutional: Alert and oriented. Well appearing and in no acute distress. Eyes: Conjunctivae are normal. EOMI. Head: Atraumatic. Nose: No congestion/rhinnorhea. Mouth/Throat: Mucous membranes are moist.   Neck: No stridor. Trachea Midline. FROM Cardiovascular: Normal rate, regular rhythm. Grossly normal heart sounds.  Good peripheral circulation. Respiratory: Normal respiratory effort.  No retractions. Lungs CTAB. Gastrointestinal: + LLQ pain No distention. No abdominal bruits.  Musculoskeletal: No lower extremity tenderness nor edema.  No joint effusions. Neurologic:  Normal speech and language. No gross focal neurologic deficits are appreciated.  Skin:  Skin is warm, dry and intact. No rash noted. Psychiatric: Mood and affect are normal. Speech and behavior are normal. GU: Deferred   ____________________________________________   LABS (all labs ordered are listed, but only abnormal results are displayed)  Labs Reviewed  LIPASE, BLOOD  COMPREHENSIVE METABOLIC PANEL  CBC  URINALYSIS, ROUTINE W REFLEX MICROSCOPIC  POC URINE PREG, ED   ____________________________________________   RADIOLOGY  Official radiology report(s): CT ABDOMEN PELVIS W CONTRAST  Result Date: 07/16/2021 CLINICAL DATA:  Left lower quadrant pain. EXAM: CT ABDOMEN AND PELVIS WITH CONTRAST TECHNIQUE: Multidetector CT imaging of the abdomen and pelvis was performed using the standard protocol following bolus administration of intravenous contrast. CONTRAST:  OMNIPAQUE IOHEXOL 300 MG/ML   SOLN COMPARISON:  December 16, 2020 FINDINGS: Lower chest: No acute abnormality. Hepatobiliary: No focal liver abnormality is seen. No gallstones, gallbladder wall thickening, or biliary dilatation. Pancreas: Unremarkable. No pancreatic ductal dilatation or surrounding inflammatory changes. Spleen: Normal in size without focal abnormality. Adrenals/Urinary Tract: Adrenal glands are unremarkable. Kidneys are normal, without renal calculi, focal lesion, or hydronephrosis. Bladder is unremarkable. Stomach/Bowel: Stomach is within normal limits. Appendix appears normal. No evidence of bowel wall thickening, distention, or inflammatory changes. Vascular/Lymphatic: No significant vascular findings are present. No enlarged abdominal or pelvic lymph nodes. Reproductive: The uterus is normal in size and slightly heterogeneous in appearance. A 2.2 cm diameter simple cyst is noted within the right adnexa. Tubal ligation clips are seen along  the left adnexa and posterior aspect of the left hemipelvis. Other: No abdominal wall hernia or abnormality. No abdominopelvic ascites. Musculoskeletal: No acute or significant osseous findings. IMPRESSION: 2.2 cm diameter simple right adnexal cyst, likely ovarian in origin. Correlation with pelvic ultrasound is recommended. Electronically Signed   By: Aram Candela M.D.   On: 07/16/2021 19:59   US PELVIC COMPLETE W TRANSVAGINAL AND TORSION R/O  Result Date: 07/16/2021 CLINICAL DATA:  Left lower quadrant pain. EXAM: TRANSABDOMINAL AND TRANSVAGINAL ULTRASOUND OF PELVIS DOPPLER ULTRASOUND OF OVARIES TECHNIQUE: Both transabdominal and transvaginal ultrasound examinations of the pelvis were performed. Transabdominal technique was performed for global imaging of the pelvis including uterus, ovaries, adnexal regions, and pelvic cul-de-sac. It was necessary to proceed with endovaginal exam following the transabdominal exam to visualize the endometrium and ovaries. Color and duplex Doppler  ultrasound was utilized to evaluate blood flow to the ovaries. COMPARISON:  Abdominopelvic CT earlier today. Pelvic ultrasound 07/01/2020 FINDINGS: Uterus Measurements: 6.7 x 4.0 x 5.7 cm = volume: 79 mL. Uterus is anteverted. No fibroids or other mass visualized. Subcentimeter nabothian cyst noted in the cervix. Endometrium Thickness: 9 mm, normal. No focal abnormality visualized. Trace fluid in the cervical canal. Right ovary Measurements: 3.4 x 2.3 x 3.1 cm = volume: 12.6 mL. There is a follicular cyst measuring 2.1 cm. This is physiologic. Normal ovarian blood flow. No adnexal mass. Left ovary Measurements: 3.1 x 1.6 x 2.5 cm = volume: 6.7 mL. Physiologic follicles. Normal blood flow. No cyst or adnexal mass. Pulsed Doppler evaluation of both ovaries demonstrates normal low-resistance arterial and venous waveforms. Other findings No abnormal free fluid. IMPRESSION: Normal pelvic ultrasound. Follicular cyst in the right ovary measuring 2.1 cm is physiologic in a patient of this age. No follow-up imaging or further characterization is needed. Electronically Signed   By: Narda Rutherford M.D.   On: 07/16/2021 18:46    ____________________________________________   PROCEDURES  Procedure(s) performed (including Critical Care):  Procedures   ____________________________________________   INITIAL IMPRESSION / ASSESSMENT AND PLAN / ED COURSE  Taylor Bautista was evaluated in Emergency Department on 07/16/2021 for the symptoms described in the history of present illness. She was evaluated in the context of the global COVID-19 pandemic, which necessitated consideration that the patient might be at risk for infection with the SARS-CoV-2 virus that causes COVID-19. Institutional protocols and algorithms that pertain to the evaluation of patients at risk for COVID-19 are in a state of rapid change based on information released by regulatory bodies including the CDC and federal and state organizations. These  policies and algorithms were followed during the patient's care in the ED.    Patient comes in with left lower quadrant pain.  We will start with ultrasound evaluate for torsion, cyst rupture.  Patient denies any symptoms to suggest PID.  We discussed doing a pelvic exam.  Patient declined pelvic exam we will do a self swab given very low risk for PID.  If ultrasound is negative will consider CT scan to rule out kidney stone, diverticulitis.  Ultrasound shows a cyst on the right ovary.  We will proceed with CT imaging and give a dose of Toradol  CT imaging was negative other than the cyst on the right side but again her pain is on the left side.  Patient provided copy of report to follow-up with OB/GYN\  Patient's labs are reassuring.  Her UA is without evidence of UTI given has a lot of squamous cells in it and  she denies any urinary symptoms.  She is very low risk for STDs and declines prophylactic treatment.  She understands that test results are pending.  Patient reports having similar pain on the right side few months ago and be prescribed Bentyl.  This seemed to help previously.  We will give her a course of Bentyl and recommend doing a food journal we will give her GI follow-up.  We discussed Tylenol, ibuprofen for pain and return to ER if symptoms are changing or worsening  I discussed the provisional nature of ED diagnosis, the treatment so far, the ongoing plan of care, follow up appointments and return precautions with the patient and any family or support people present. They expressed understanding and agreed with the plan, discharged home.            ____________________________________________   FINAL CLINICAL IMPRESSION(S) / ED DIAGNOSES   Final diagnoses:  LLQ pain      MEDICATIONS GIVEN DURING THIS VISIT:  Medications  acetaminophen (TYLENOL) tablet 1,000 mg (1,000 mg Oral Given 07/16/21 1839)  ketorolac (TORADOL) 30 MG/ML injection 15 mg (15 mg Intravenous  Given 07/16/21 1917)  iohexol (OMNIPAQUE) 300 MG/ML solution 100 mL (100 mLs Intravenous Contrast Given 07/16/21 1949)     ED Discharge Orders          Ordered    dicyclomine (BENTYL) 20 MG tablet  3 times daily before meals & bedtime        07/16/21 2041             Note:  This document was prepared using Dragon voice recognition software and may include unintentional dictation errors.    Concha Se, MD 07/16/21 2042

## 2021-07-16 NOTE — Telephone Encounter (Signed)
I see that she has checked into the ED. Will follow. Thanks

## 2021-07-16 NOTE — ED Notes (Signed)
See triage note. Pt indicates pain is in LLQ. Pain described as "sharp, cramp like". Pt not currently on her cycle. Last bowel movement today. Denies any bowel/urinary difficulty. Pt self medicated with motrin this morning with no relief. At time of assessment, pt A&Ox4, NAD, ambulatory without assistance.

## 2021-07-17 ENCOUNTER — Ambulatory Visit: Payer: 59 | Admitting: Nurse Practitioner

## 2021-07-18 ENCOUNTER — Other Ambulatory Visit: Payer: Self-pay | Admitting: Family Medicine

## 2021-07-20 ENCOUNTER — Ambulatory Visit
Admission: RE | Admit: 2021-07-20 | Discharge: 2021-07-20 | Disposition: A | Discharge status: Home / Self Care | Payer: 59 | Source: Ambulatory Visit | Attending: Family Medicine | Admitting: Family Medicine

## 2021-07-20 ENCOUNTER — Other Ambulatory Visit: Payer: Self-pay

## 2021-07-20 DIAGNOSIS — Z803 Family history of malignant neoplasm of breast: Secondary | ICD-10-CM | POA: Diagnosis not present

## 2021-07-20 DIAGNOSIS — Z1231 Encounter for screening mammogram for malignant neoplasm of breast: Secondary | ICD-10-CM | POA: Insufficient documentation

## 2021-07-20 NOTE — Telephone Encounter (Signed)
Not sure if patient should still be doing injections. Please review.

## 2021-08-31 ENCOUNTER — Ambulatory Visit (INDEPENDENT_AMBULATORY_CARE_PROVIDER_SITE_OTHER): Payer: 59

## 2021-08-31 ENCOUNTER — Other Ambulatory Visit (HOSPITAL_COMMUNITY)
Admission: RE | Admit: 2021-08-31 | Discharge: 2021-08-31 | Disposition: A | Discharge status: Home / Self Care | Payer: 59 | Source: Ambulatory Visit | Attending: Family Medicine | Admitting: Family Medicine

## 2021-08-31 ENCOUNTER — Other Ambulatory Visit: Payer: Self-pay

## 2021-08-31 VITALS — BP 109/73 | HR 76 | Wt 143.0 lb

## 2021-08-31 DIAGNOSIS — Z113 Encounter for screening for infections with a predominantly sexual mode of transmission: Secondary | ICD-10-CM | POA: Diagnosis not present

## 2021-08-31 DIAGNOSIS — Z9189 Other specified personal risk factors, not elsewhere classified: Secondary | ICD-10-CM

## 2021-08-31 DIAGNOSIS — B3731 Acute candidiasis of vulva and vagina: Secondary | ICD-10-CM

## 2021-08-31 DIAGNOSIS — N760 Acute vaginitis: Secondary | ICD-10-CM

## 2021-08-31 LAB — POCT URINALYSIS DIPSTICK
Bilirubin, UA: NEGATIVE
Blood, UA: NEGATIVE
Glucose, UA: NEGATIVE
Ketones, UA: NEGATIVE
Leukocytes, UA: NEGATIVE
Nitrite, UA: NEGATIVE
Protein, UA: NEGATIVE
Urobilinogen, UA: 0.2 E.U./dL
pH, UA: 7 (ref 5.0–8.0)

## 2021-08-31 NOTE — Progress Notes (Signed)
Attestation of Attending Supervision of clinical support staff: I agree with the care provided to this patient and was available for any consultation.  I have reviewed the CMA's note and chart, and I agree with the management and plan.  Olesya Wike MD MPH, ABFM Attending Physician Faculty Practice- [facility]  

## 2021-08-31 NOTE — Progress Notes (Signed)
SUBJECTIVE:  30 y.o. female complains of vaginal swelling requesting STD screening  vaginal discharge for n/a Denies vaginal discharge  Denies abnormal vaginal bleeding or significant pelvic pain or fever.Denies history of known exposure to STD.  No LMP recorded.  OBJECTIVE:  She appears alert, well appearing Urine dipstick: negative for all components.  ASSESSMENT:  Vaginal swelling  Pt requesting urine Cx     PLAN:  GC, chlamydia, trichomonas, BVAG, CVAG probe and urine culture sent to lab. Treatment: To be determined once lab results are received ROV prn if symptoms persist or worsen.

## 2021-09-01 LAB — CERVICOVAGINAL ANCILLARY ONLY
Bacterial Vaginitis (gardnerella): POSITIVE — AB
Candida Glabrata: NEGATIVE
Candida Vaginitis: POSITIVE — AB
Chlamydia: NEGATIVE
Comment: NEGATIVE
Comment: NEGATIVE
Comment: NEGATIVE
Comment: NEGATIVE
Comment: NEGATIVE
Comment: NORMAL
Neisseria Gonorrhea: NEGATIVE
Trichomonas: NEGATIVE

## 2021-09-01 MED ORDER — FLUCONAZOLE 150 MG PO TABS: 150.0000 mg | ORAL_TABLET | Freq: Once | ORAL | 3 refills | Status: AC

## 2021-09-01 MED ORDER — METRONIDAZOLE 500 MG PO TABS: 500.0000 mg | ORAL_TABLET | Freq: Two times a day (BID) | ORAL | 0 refills | Status: DC

## 2021-09-01 NOTE — Addendum Note (Signed)
Addended by: Geanie Berlin on: 09/01/2021 12:19 PM   Modules accepted: Orders

## 2021-09-04 LAB — URINE CULTURE

## 2021-09-15 ENCOUNTER — Other Ambulatory Visit: Payer: Self-pay

## 2021-09-15 DIAGNOSIS — N898 Other specified noninflammatory disorders of vagina: Secondary | ICD-10-CM

## 2021-09-15 MED ORDER — FLUCONAZOLE 150 MG PO TABS: 150.0000 mg | ORAL_TABLET | Freq: Once | ORAL | 0 refills | Status: AC

## 2021-09-15 NOTE — Progress Notes (Signed)
Pt called requesting Rx for refill on diflucan Notes yeast sx's Rx sent per protocol.

## 2021-09-18 ENCOUNTER — Telehealth (INDEPENDENT_AMBULATORY_CARE_PROVIDER_SITE_OTHER): Payer: 59 | Admitting: Nurse Practitioner

## 2021-09-18 ENCOUNTER — Encounter: Payer: Self-pay | Admitting: Nurse Practitioner

## 2021-09-18 ENCOUNTER — Other Ambulatory Visit: Payer: Self-pay

## 2021-09-18 DIAGNOSIS — J40 Bronchitis, not specified as acute or chronic: Secondary | ICD-10-CM

## 2021-09-18 MED ORDER — AZITHROMYCIN 250 MG PO TABS: ORAL_TABLET | ORAL | 0 refills | Status: AC

## 2021-09-18 NOTE — Progress Notes (Signed)
Patient ID: Taylor Bautista, female    DOB: 1990-10-05, 31 y.o.   MRN: [id number]  Virtual visit completed through [employer], a video enabled telemedicine application. Due to national recommendations of social distancing due to COVID-19, a virtual visit is felt to be most appropriate for this patient at this time. Reviewed limitations, risks, security and privacy concerns of performing a virtual visit and the availability of in person appointments. I also reviewed that there may be a patient responsible charge related to this service. The patient agreed to proceed.   Patient location: home Provider location: [place] at [facility], office Persons participating in this virtual visit: patient, provider   If any vitals were documented, they were collected by patient at home unless specified below.    LMP 09/15/2021    CC: Cough Subjective:   HPI: Taylor Bautista is a 31 y.o. female presenting on 09/18/2021 for Cough (Sx started on 09/12/21-low grade fever, nasal congestion, runny nose, ears popping, cough, sore throat from coughing, headache. Covid test negative on 09/14/20)  Symotoms started on 09/12/2021 Covid test Monday 09/14/21 was negaive No covid vaccine Whole house sick [place] with relief   Symptoms are the same Former smoker and former vape user   Relevant past medical, surgical, family and social history reviewed and updated as indicated. Interim medical history since our last visit reviewed. Allergies and medications reviewed and updated. Outpatient Medications Prior to Visit  Medication Sig Dispense Refill   cyanocobalamin (,VITAMIN B-12,) 1000 MCG/ML injection INJECT 1 ML (1,000 MCG TOTAL) INTO THE MUSCLE EVERY 30 DAYS. 3 mL 0   ibuprofen (ADVIL) 800 MG tablet Take 1 tablet (800 mg total) by mouth every 8 (eight) hours as needed. 30 tablet 2   dicyclomine (BENTYL) 20 MG tablet Take 1 tablet (20 mg total) by mouth 4 (four) times daily -  before meals and at  bedtime for 7 days. 28 tablet 0   metroNIDAZOLE (FLAGYL) 500 MG tablet Take 1 tablet (500 mg total) by mouth 2 (two) times daily. 14 tablet 0   terbinafine (LAMISIL) 250 MG tablet Take 1 tablet (250 mg total) by mouth daily. 30 tablet 1   No facility-administered medications prior to visit.     Per HPI unless specifically indicated in ROS section below Review of Systems  Constitutional:  Positive for fatigue. Negative for chills and fever.  HENT:  Positive for congestion, ear pain (fullness), postnasal drip, sinus pressure and sinus pain. Negative for ear discharge and sore throat.   Respiratory:  Positive for cough (yellowish) and shortness of breath (DOE).   Cardiovascular:  Positive for chest pain (cough).  Gastrointestinal:  Negative for abdominal pain, diarrhea, nausea and vomiting.  Musculoskeletal:  Positive for myalgias (improved). Negative for arthralgias.  Neurological:  Positive for headaches.  Objective:  LMP 09/15/2021   Wt Readings from Last 3 Encounters:  08/31/21 143 lb (64.9 kg)  07/16/21 140 lb 4 oz (63.6 kg)  07/06/21 140 lb 4 oz (63.6 kg)       Physical exam: Gen: alert, NAD, not ill appearing Pulm: speaks in complete sentences without increased work of breathing Psych: normal mood, normal thought content      Results for orders placed or performed in visit on 08/31/21  Urine Culture   Specimen: Urine, Clean Catch   UR  Result Value Ref Range   Urine Culture, Routine Final report    Organism ID, Bacteria Comment   POCT Urinalysis Dipstick  Result  Value Ref Range   Color, UA yellow    Clarity, UA clear    Glucose, UA Negative Negative   Bilirubin, UA Negative    Ketones, UA Negative    Spec Grav, UA     Blood, UA neg    pH, UA 7.0 5.0 - 8.0   Protein, UA Negative Negative   Urobilinogen, UA 0.2 0.2 or 1.0 E.U./dL   Nitrite, UA Negative    Leukocytes, UA Negative Negative   Appearance     Odor    Cervicovaginal ancillary only( [place])   Result Value Ref Range   Neisseria Gonorrhea Negative    Chlamydia Negative    Trichomonas Negative    Bacterial Vaginitis (gardnerella) Positive (A)    Candida Vaginitis Positive (A)    Candida Glabrata Negative    Comment      Normal Reference Range Bacterial Vaginosis - Negative   Comment Normal Reference Range Candida Species - Negative    Comment Normal Reference Range Candida Galbrata - Negative    Comment Normal Reference Range Trichomonas - Negative    Comment Normal Reference Ranger Chlamydia - Negative    Comment      Normal Reference Range Neisseria Gonorrhea - Negative   Assessment & Plan:   Problem List Items Addressed This Visit       Respiratory   Bronchitis - Primary    Given length of illness and being a former smoker/electronic cigarette user.  We will like to treat.  Start azithromycin 250 mg pack follow instructions For 5 days.  Symptoms reviewed as well as seek urgent or emergent health care.  Continue over-the-counter treatments as beneficial.  Did offer to send in something for cough for patient but she politely declined.      Relevant Medications   azithromycin (ZITHROMAX) 250 MG tablet     Meds ordered this encounter  Medications   azithromycin (ZITHROMAX) 250 MG tablet    Sig: Take 2 tablets on day 1, then 1 tablet daily on days 2 through 5    Dispense:  6 tablet    Refill:  0    Order Specific Question:   Supervising Provider    Answer:   TOWER, MARNE A [[id number]]   No orders of the defined types were placed in this encounter.   I discussed the assessment and treatment plan with the patient. The patient was provided an opportunity to ask questions and all were answered. The patient agreed with the plan and demonstrated an understanding of the instructions. The patient was advised to call back or seek an in-person evaluation if the symptoms worsen or if the condition fails to improve as anticipated.  Follow up plan: No follow-ups on file.  Romilda Garret, NP

## 2021-09-18 NOTE — Assessment & Plan Note (Signed)
Given length of illness and being a former smoker/electronic cigarette user.  We will like to treat.  Start azithromycin 250 mg pack follow instructions For 5 days.  Symptoms reviewed as well as seek urgent or emergent health care.  Continue over-the-counter treatments as beneficial.  Did offer to send in something for cough for patient but she politely declined.

## 2021-09-29 ENCOUNTER — Telehealth (INDEPENDENT_AMBULATORY_CARE_PROVIDER_SITE_OTHER): Payer: 59 | Admitting: Nurse Practitioner

## 2021-09-29 ENCOUNTER — Telehealth: Payer: Self-pay | Admitting: Nurse Practitioner

## 2021-09-29 ENCOUNTER — Other Ambulatory Visit: Payer: Self-pay

## 2021-09-29 ENCOUNTER — Encounter: Payer: Self-pay | Admitting: Nurse Practitioner

## 2021-09-29 DIAGNOSIS — F419 Anxiety disorder, unspecified: Secondary | ICD-10-CM

## 2021-09-29 MED ORDER — HYDROXYZINE PAMOATE 25 MG PO CAPS: 25.0000 mg | ORAL_CAPSULE | Freq: Two times a day (BID) | ORAL | 0 refills | Status: AC | PRN

## 2021-09-29 NOTE — Progress Notes (Signed)
Patient ID: Taylor Bautista, female    DOB: 04/15/1991, 31 y.o.   MRN: [id number]  Virtual visit completed through [employer]gility, a video enabled telemedicine application. Due to national recommendations of social distancing due to COVID-19, a virtual visit is felt to be most appropriate for this patient at this time. Reviewed limitations, risks, security and privacy concerns of performing a virtual visit and the availability of in person appointments. I also reviewed that there may be a patient responsible charge related to this service. The patient agreed to proceed.   Patient location: home [facility] [place] [facility] Persons participating in this virtual visit: patient, provider   If any vitals were documented, they were collected by patient at home unless specified below.    LMP 09/15/2021    CC: Anxiety Subjective:   HPI: Taylor Bautista is a 31 y.o. female presenting on 09/29/2021 for Anxiety (Has been an issue for about a year now. Feels sick to her stomach, feels so anxious. Use to be on the medication when she was younger but was able to wean off. Has hard time staying asleep at night and wakes up with anxiety.)   Anxious over the past year. States that she has a history of severe anxiety as a kid. States that she was on alprazolam and was weaned off. At that point she states that she was seeing a psychiatrist. States that over the past couple months it has been increasing. No notable change in life or work to cause this.  States that she has done therapy before and found some benefit in it. States that through her employer they offer private counseling. She has utilized that in the past but not recently   Feels that her mind is constantly racing and she feels her heart beat hard and she gets nauseous and does not eat, did also mention that she has vomited. States that she has it not everyday. Can have good days and then days when things trigger her anxiety.  Has days that she has it and no none triggers  Goes to bed at 9p and sleeps to 5a. States some nights that she will wake up feeling anxious. States sometimes that she has thoughts of worry  PHQ9 SCORE ONLY 09/29/2021 02/01/2018 02/01/2018  PHQ-9 Total Score 8 0 0    GAD 7 : Generalized Anxiety Score 09/29/2021  Nervous, Anxious, on Edge 2  Control/stop worrying 3  Worry too much - different things 3  Trouble relaxing 3  Restless 0  Easily annoyed or irritable 1  Afraid - awful might happen 0  Total GAD 7 Score 12  Anxiety Difficulty Very difficult      Relevant past medical, surgical, family and social history reviewed and updated as indicated. Interim medical history since our last visit reviewed. Allergies and medications reviewed and updated. Outpatient Medications Prior to Visit  Medication Sig Dispense Refill   cyanocobalamin (,VITAMIN B-12,) 1000 MCG/ML injection INJECT 1 ML (1,000 MCG TOTAL) INTO THE MUSCLE EVERY 30 DAYS. 3 mL 0   ibuprofen (ADVIL) 800 MG tablet Take 1 tablet (800 mg total) by mouth every 8 (eight) hours as needed. 30 tablet 2   No facility-administered medications prior to visit.     Per HPI unless specifically indicated in ROS section below Review of Systems  Respiratory:  Negative for shortness of breath.   Cardiovascular:  Negative for chest pain and palpitations.  Gastrointestinal:  Positive for nausea and vomiting.  Psychiatric/Behavioral:  Negative for hallucinations and suicidal ideas. The patient is nervous/anxious.   Objective:  LMP 09/15/2021   Wt Readings from Last 3 Encounters:  08/31/21 143 lb (64.9 kg)  07/16/21 140 lb 4 oz (63.6 kg)  07/06/21 140 lb 4 oz (63.6 kg)       Physical exam: Gen: alert, NAD, not ill appearing Pulm: speaks in complete sentences without increased work of breathing Psych: normal mood, normal thought content      Results for orders placed or performed in visit on 08/31/21  Urine Culture   Specimen: Urine,  Clean Catch   UR  Result Value Ref Range   Urine Culture, Routine Final report    Organism ID, Bacteria Comment   POCT Urinalysis Dipstick  Result Value Ref Range   Color, UA yellow    Clarity, UA clear    Glucose, UA Negative Negative   Bilirubin, UA Negative    Ketones, UA Negative    Spec Grav, UA     Blood, UA neg    pH, UA 7.0 5.0 - 8.0   Protein, UA Negative Negative   Urobilinogen, UA 0.2 0.2 or 1.0 E.U./dL   Nitrite, UA Negative    Leukocytes, UA Negative Negative   Appearance     Odor    Cervicovaginal ancillary only( [place])  Result Value Ref Range   Neisseria Gonorrhea Negative    Chlamydia Negative    Trichomonas Negative    Bacterial Vaginitis (gardnerella) Positive (A)    Candida Vaginitis Positive (A)    Candida Glabrata Negative    Comment      Normal Reference Range Bacterial Vaginosis - Negative   Comment Normal Reference Range Candida Species - Negative    Comment Normal Reference Range Candida Galbrata - Negative    Comment Normal Reference Range Trichomonas - Negative    Comment Normal Reference Ranger Chlamydia - Negative    Comment      Normal Reference Range Neisseria Gonorrhea - Negative   Assessment & Plan:   Problem List Items Addressed This Visit       Other   Anxiety - Primary    Patient used to be on Xanax in the past but weaned off.  Do not use benzodiazepine the primary care very often that we can start with alternative methods.  Patient is in agreements we will start with hydroxyzine 25 mg twice daily as needed anxiety.  Patient can update me via MyChart as he has medication works.  Patient denies HI/SI/AVH.  Continue to monitor.  PHQ-9 and GAD-7 administered over the telephone prior to visit.      Relevant Medications   hydrOXYzine (VISTARIL) 25 MG capsule     Meds ordered this encounter  Medications   hydrOXYzine (VISTARIL) 25 MG capsule    Sig: Take 1 capsule (25 mg total) by mouth 2 (two) times daily as needed for  anxiety.    Dispense:  60 capsule    Refill:  0    Order Specific Question:   Supervising Provider    Answer:   TOWER, MARNE A [[id number]]   No orders of the defined types were placed in this encounter.   I discussed the assessment and treatment plan with the patient. The patient was provided an opportunity to ask questions and all were answered. The patient agreed with the plan and demonstrated an understanding of the instructions. The patient was advised to call back or seek an in-person evaluation if the symptoms worsen or if  the condition fails to improve as anticipated.  Follow up plan: No follow-ups on file.  Audria Nine, NP

## 2021-09-29 NOTE — Telephone Encounter (Signed)
Can we get patient scheduled for a CPE in approx 2 months. She would like to wait until we are back at [facility]

## 2021-09-29 NOTE — Assessment & Plan Note (Signed)
Patient used to be on Xanax in the past but weaned off.  Do not use benzodiazepine the primary care very often that we can start with alternative methods.  Patient is in agreements we will start with hydroxyzine 25 mg twice daily as needed anxiety.  Patient can update me via MyChart as he has medication works.  Patient denies HI/SI/AVH.  Continue to monitor.  PHQ-9 and GAD-7 administered over the telephone prior to visit.

## 2021-09-29 NOTE — Telephone Encounter (Signed)
Patient is scheduled for 11/16/21

## 2021-10-19 ENCOUNTER — Other Ambulatory Visit: Payer: Self-pay

## 2021-10-19 MED ORDER — CYANOCOBALAMIN 1000 MCG/ML IJ SOLN: INTRAMUSCULAR | 0 refills | Status: DC

## 2021-10-19 NOTE — Telephone Encounter (Signed)
Refill request from [facility] pharmacy for B12 injection Last filled on 07/20/21 for 3 ml with 0 refill LOV 09/29/21-virtual for anxiety Next visit on 11/16/21

## 2021-10-28 ENCOUNTER — Ambulatory Visit (INDEPENDENT_AMBULATORY_CARE_PROVIDER_SITE_OTHER): Payer: 59 | Admitting: *Deleted

## 2021-10-28 ENCOUNTER — Other Ambulatory Visit (HOSPITAL_COMMUNITY)
Admission: RE | Admit: 2021-10-28 | Discharge: 2021-10-28 | Disposition: A | Discharge status: Home / Self Care | Payer: 59 | Source: Ambulatory Visit | Attending: Obstetrics and Gynecology | Admitting: Obstetrics and Gynecology

## 2021-10-28 ENCOUNTER — Other Ambulatory Visit: Payer: Self-pay

## 2021-10-28 VITALS — BP 106/74 | HR 57

## 2021-10-28 DIAGNOSIS — N898 Other specified noninflammatory disorders of vagina: Secondary | ICD-10-CM | POA: Diagnosis present

## 2021-10-28 DIAGNOSIS — R300 Dysuria: Secondary | ICD-10-CM

## 2021-10-28 LAB — POCT URINALYSIS DIPSTICK
Blood, UA: NEGATIVE
Leukocytes, UA: NEGATIVE

## 2021-10-28 NOTE — Progress Notes (Cosign Needed)
SUBJECTIVE:  31 y.o. female complains of  vaginal discharge  and irritation for few week(s) and  burning with urination. Denies abnormal vaginal bleeding or significant pelvic pain or fever.Denies history of known exposure to STD.  No LMP recorded.  OBJECTIVE:  She appears alert, well appearing, in no apparent distress Urine dipstick: negative for all components.  ASSESSMENT:  Vaginal Discharge  Vaginal Odor Dysuria    PLAN:   BVAG, CVAG probe and urine culture sent to lab. Treatment: To be determined once lab results are received ROV prn if symptoms persist or worsen.

## 2021-10-29 LAB — CERVICOVAGINAL ANCILLARY ONLY
Bacterial Vaginitis (gardnerella): NEGATIVE
Candida Glabrata: NEGATIVE
Candida Vaginitis: NEGATIVE
Comment: NEGATIVE
Comment: NEGATIVE
Comment: NEGATIVE

## 2021-10-31 LAB — URINE CULTURE

## 2021-11-02 NOTE — Progress Notes (Signed)
Patient was assessed and managed by nursing staff during this encounter. I have reviewed the chart and agree with the documentation and plan. I have also made any necessary editorial changes.  Sabinal Bing, MD 11/02/2021 8:39 AM

## 2021-11-16 ENCOUNTER — Encounter: Payer: 59 | Admitting: Nurse Practitioner

## 2021-11-17 ENCOUNTER — Other Ambulatory Visit: Payer: Self-pay

## 2021-11-17 ENCOUNTER — Other Ambulatory Visit (HOSPITAL_COMMUNITY)
Admission: RE | Admit: 2021-11-17 | Discharge: 2021-11-17 | Disposition: A | Discharge status: Home / Self Care | Payer: 59 | Source: Ambulatory Visit | Attending: Nurse Practitioner | Admitting: Nurse Practitioner

## 2021-11-17 ENCOUNTER — Encounter: Payer: Self-pay | Admitting: Nurse Practitioner

## 2021-11-17 ENCOUNTER — Ambulatory Visit (INDEPENDENT_AMBULATORY_CARE_PROVIDER_SITE_OTHER): Payer: 59 | Admitting: Nurse Practitioner

## 2021-11-17 VITALS — BP 100/78 | HR 77 | Temp 97.6°F | Resp 12 | Ht 65.0 in | Wt 134.4 lb

## 2021-11-17 DIAGNOSIS — F419 Anxiety disorder, unspecified: Secondary | ICD-10-CM | POA: Diagnosis not present

## 2021-11-17 DIAGNOSIS — Z113 Encounter for screening for infections with a predominantly sexual mode of transmission: Secondary | ICD-10-CM

## 2021-11-17 DIAGNOSIS — Z0000 Encounter for general adult medical examination without abnormal findings: Secondary | ICD-10-CM

## 2021-11-17 DIAGNOSIS — R300 Dysuria: Secondary | ICD-10-CM

## 2021-11-17 DIAGNOSIS — N898 Other specified noninflammatory disorders of vagina: Secondary | ICD-10-CM

## 2021-11-17 DIAGNOSIS — Z124 Encounter for screening for malignant neoplasm of cervix: Secondary | ICD-10-CM | POA: Diagnosis present

## 2021-11-17 DIAGNOSIS — F322 Major depressive disorder, single episode, severe without psychotic features: Secondary | ICD-10-CM | POA: Diagnosis not present

## 2021-11-17 LAB — POCT URINALYSIS DIPSTICK
Bilirubin, UA: NEGATIVE
Blood, UA: NEGATIVE
Glucose, UA: NEGATIVE
Ketones, UA: POSITIVE
Leukocytes, UA: NEGATIVE
Nitrite, UA: NEGATIVE
Protein, UA: NEGATIVE
Spec Grav, UA: 1.01 (ref 1.010–1.025)
Urobilinogen, UA: 0.2 E.U./dL
pH, UA: 6 (ref 5.0–8.0)

## 2021-11-17 LAB — CBC WITH DIFFERENTIAL/PLATELET
Basophils Absolute: 0 10*3/uL (ref 0.0–0.1)
Basophils Relative: 0.5 % (ref 0.0–3.0)
Eosinophils Absolute: 0 10*3/uL (ref 0.0–0.7)
Eosinophils Relative: 1 % (ref 0.0–5.0)
HCT: 37.7 % (ref 36.0–46.0)
Hemoglobin: 12.8 g/dL (ref 12.0–15.0)
Lymphocytes Relative: 26 % (ref 12.0–46.0)
Lymphs Abs: 1.2 10*3/uL (ref 0.7–4.0)
MCHC: 34 g/dL (ref 30.0–36.0)
MCV: 92.6 fl (ref 78.0–100.0)
Monocytes Absolute: 0.4 10*3/uL (ref 0.1–1.0)
Monocytes Relative: 8.7 % (ref 3.0–12.0)
Neutro Abs: 2.9 10*3/uL (ref 1.4–7.7)
Neutrophils Relative %: 63.8 % (ref 43.0–77.0)
Platelets: 280 10*3/uL (ref 150.0–400.0)
RBC: 4.07 Mil/uL (ref 3.87–5.11)
RDW: 12.3 % (ref 11.5–15.5)
WBC: 4.5 10*3/uL (ref 4.0–10.5)

## 2021-11-17 LAB — LIPID PANEL
Cholesterol: 174 mg/dL (ref 0–200)
HDL: 53 mg/dL
LDL Cholesterol: 109 mg/dL — ABNORMAL HIGH (ref 0–99)
NonHDL: 120.9
Total CHOL/HDL Ratio: 3
Triglycerides: 58 mg/dL (ref 0.0–149.0)
VLDL: 11.6 mg/dL (ref 0.0–40.0)

## 2021-11-17 LAB — COMPREHENSIVE METABOLIC PANEL WITH GFR
ALT: 11 U/L (ref 0–35)
AST: 13 U/L (ref 0–37)
Albumin: 4.4 g/dL (ref 3.5–5.2)
Alkaline Phosphatase: 42 U/L (ref 39–117)
BUN: 11 mg/dL (ref 6–23)
CO2: 29 meq/L (ref 19–32)
Calcium: 9.3 mg/dL (ref 8.4–10.5)
Chloride: 102 meq/L (ref 96–112)
Creatinine, Ser: 0.66 mg/dL (ref 0.40–1.20)
GFR: 115.73 mL/min
Glucose, Bld: 87 mg/dL (ref 70–99)
Potassium: 3.7 meq/L (ref 3.5–5.1)
Sodium: 138 meq/L (ref 135–145)
Total Bilirubin: 0.7 mg/dL (ref 0.2–1.2)
Total Protein: 6.3 g/dL (ref 6.0–8.3)

## 2021-11-17 LAB — TSH: TSH: 1.09 u[IU]/mL (ref 0.35–5.50)

## 2021-11-17 MED ORDER — FLUOXETINE HCL 20 MG PO TABS: 20.0000 mg | ORAL_TABLET | Freq: Every day | ORAL | 2 refills | Status: DC

## 2021-11-17 NOTE — Patient Instructions (Signed)
Nice to see you today ?I will be in touch with lab results ? ?

## 2021-11-17 NOTE — Assessment & Plan Note (Signed)
Patient has a history of depression. She did have a OD on zoloft when she was 13. Outside of that patinet has not had any inpatient treatment or self injury. Patient states that she has been on benzos in the past and was followed by psych and counseling. Referral placed for psych and counseling. Will start patient on SSRI. Information on emergent behavioral health treatment options. No HI/SI/AVH ?

## 2021-11-17 NOTE — Assessment & Plan Note (Signed)
Discussed screening exams with patient and vaccinations per patient age ? ?

## 2021-11-17 NOTE — Progress Notes (Signed)
Established Patient Office Visit  Subjective:  Patient ID: Taylor Bautista, female    DOB: Dec 13, 1990  Age: 31 y.o. MRN: [id number]  CC:  Chief Complaint  Patient presents with   Annual Exam   transfer of care    HPI Taylor Bautista presents for TOC and for complete physical and follow up of chronic conditions.  Immunizations: -Tetanus: 2017 -Influenza: refused -Covid-19: refused -Shingles: NA -Pneumonia: NA  -HPV: unsure  Diet: Fair diet. Poor appetitie. Water only Exercise: No regular exercise. No desire or motivation   Eye exam: PRN Dental exam: Completes semi-annually   Pap Smear: Completed in over 3 years. Once or twice abnormal pap.No hx of colopscpoy or Leep Mammogram: Completed in 07/2021  Colonoscopy: Sister dx at age 53 no other colon cancer int he family   Lung Cancer Screening: Completed in    Anxiety: was prescribed hydroxyzine and it is not being helpful with two time a day dosing  Did see psych and counselling. States that it was helpful. WOuld be interested in counseling When she was 13 she did overdose on zolft. No other hospitalizations since and no self injury.  Sleep: States that she can Goes to bed at 9 and gets up at 5. Sometimes she will wake up due to anxiety. States that she does not feel rested  PHQ9 SCORE ONLY 11/18/2021 09/29/2021 02/01/2018  PHQ-9 Total Score 17 8 0   GAD 7 : Generalized Anxiety Score 11/18/2021 09/29/2021  Nervous, Anxious, on Edge 3 2  Control/stop worrying 3 3  Worry too much - different things 3 3  Trouble relaxing 3 3  Restless 3 0  Easily annoyed or irritable 2 1  Afraid - awful might happen 0 0  Total GAD 7 Score 17 12  Anxiety Difficulty Very difficult Very difficult       Past Medical History:  Diagnosis Date   Allergy    Anxiety    no meds   Dysmenorrhea    Dysthymic disorder    Exposure to STD    MRSA infection    Sleep apnea    pt states she does not use her CPAP    Tobacco use disorder      Past Surgical History:  Procedure Laterality Date   FOOT SURGERY  2016   LAPAROSCOPIC TUBAL LIGATION Bilateral 08/02/2018   Procedure: LAPAROSCOPIC TUBAL LIGATION - Filshie Clips;  Surgeon: Worthington Bing, MD;  Location: [facility];  Service: Gynecology;  Laterality: Bilateral;   Pigmented nevus removal  04/1989    Family History  Problem Relation Age of Onset   Breast cancer Mother 74       initial 73, recurrence at 72   Cancer Sister 40       colon cancer   Anxiety disorder Sister    Depression Sister    Cancer Sister 23       lymphnode cancer   Breast cancer Cousin 24       breast   Anxiety disorder Other     Social History   Socioeconomic History   Marital status: Divorced    Spouse name: Not on file   Number of children: 3   Years of education: Not on file   Highest education level: Not on file  Occupational History   Not on file  Tobacco Use   Smoking status: Former    Packs/day: 0.50    Years: 10.00    Pack years: 5.00    Types:  Cigarettes    Quit date: 09/27/2018    Years since quitting: 3.1   Smokeless tobacco: Never  Vaping Use   Vaping Use: Never used  Substance and Sexual Activity   Alcohol use: Yes    Comment: very rare   Drug use: No    Comment: MJ occasionally; 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 3 mL 0    ibuprofen (ADVIL) 800 MG tablet Take 1 tablet (800 mg total) by mouth every 8 (eight) hours as needed. 30 tablet 2   No facility-administered medications prior to visit.    No Known Allergies  ROS Review of Systems  Constitutional:  Positive for fatigue. Negative for chills and fever.  Eyes:  Negative for visual disturbance.  Respiratory:  Negative for cough and shortness of breath.   Cardiovascular:  Negative for chest pain and leg swelling.  Gastrointestinal:  Positive for nausea and vomiting (every morning). Negative for abdominal pain and diarrhea.       BM daily   Genitourinary:  Positive for dysuria and vaginal discharge (discharge and vaginal burning). Negative for difficulty urinating and hematuria.  Neurological:  Negative for dizziness, light-headedness, numbness and headaches.  Psychiatric/Behavioral:  Negative for hallucinations, self-injury and suicidal ideas.      Objective:    Physical Exam Vitals and nursing note reviewed. Exam conducted with a chaperone present [facility] Marrero, RMA).  Constitutional:      Appearance: Normal appearance.  HENT:     Right Ear: Tympanic membrane, ear canal and external ear normal.     Left Ear: Tympanic membrane, ear canal and external ear normal.     Mouth/Throat:     Mouth: Mucous membranes are moist.     Pharynx: Oropharynx is clear.  Eyes:     Extraocular Movements: Extraocular movements intact.     Pupils: Pupils are equal, round, and reactive to light.  Cardiovascular:     Rate and Rhythm: Normal rate and regular rhythm.     Pulses: Normal pulses.     Heart sounds: Normal heart sounds.  Pulmonary:     Effort: Pulmonary effort is normal.     Breath sounds: Normal breath sounds.  Abdominal:     General: Bowel sounds are normal. There is no distension.     Palpations: Abdomen is soft. There is no mass.     Tenderness: There is no abdominal tenderness.     Hernia: No hernia is present.  Genitourinary:    Exam  position: Lithotomy position.     Vagina: Normal.     Cervix: Normal.     Uterus: Normal.      Adnexa: Right adnexa normal and left adnexa normal.     Comments: Patient had scan amount of very light what colored discharge. Recently finished her menstrual cycle Musculoskeletal:  Right lower leg: No edema.     Left lower leg: No edema.  Lymphadenopathy:     Cervical: No cervical adenopathy.  Skin:    General: Skin is warm.  Neurological:     General: No focal deficit present.     Mental Status: She is alert.     Deep Tendon Reflexes:     Reflex Scores:      Bicep reflexes are 2+ on the right side and 2+ on the left side.      Patellar reflexes are 2+ on the right side and 2+ on the left side.    Comments: Bilateral upper and lower extremity strength 5/5  Psychiatric:        Attention and Perception: Attention normal.        Mood and Affect: Mood is depressed. Affect is tearful.        Speech: Speech normal.        Behavior: Behavior normal. Behavior is cooperative.        Thought Content: Thought content normal. Thought content is not delusional. Thought content does not include homicidal or suicidal ideation. Thought content does not include homicidal or suicidal plan.        Cognition and Memory: Cognition normal.    BP 100/78    Pulse 77    Temp 97.6 F (36.4 C)    Resp 12    Ht 5\' 5"  (1.651 m)    Wt 134 lb 6 oz (61 kg)    LMP 11/10/2021    SpO2 98%    BMI 22.36 kg/m  Wt Readings from Last 3 Encounters:  11/17/21 134 lb 6 oz (61 kg)  08/31/21 143 lb (64.9 kg)  07/16/21 140 lb 4 oz (63.6 kg)     There are no preventive care reminders to display for this patient.  There are no preventive care reminders to display for this patient.  Lab Results  Component Value Date   TSH 2.27 04/27/2021   Lab Results  Component Value Date   WBC 5.9 07/16/2021   HGB 13.0 07/16/2021   HCT 37.6 07/16/2021   MCV 92.6 07/16/2021   PLT 288 07/16/2021   Lab Results  Component Value  Date   NA 139 07/16/2021   K 3.9 07/16/2021   CO2 29 07/16/2021   GLUCOSE 81 07/16/2021   BUN 12 07/16/2021   CREATININE 0.58 07/16/2021   BILITOT 0.7 07/16/2021   ALKPHOS 58 07/16/2021   AST 18 07/16/2021   ALT 22 07/16/2021   PROT 7.0 07/16/2021   ALBUMIN 4.0 07/16/2021   CALCIUM 9.4 07/16/2021   ANIONGAP 5 07/16/2021   GFR 110.74 04/27/2021   Lab Results  Component Value Date   CHOL 162 02/01/2018   Lab Results  Component Value Date   HDL 48.90 02/01/2018   Lab Results  Component Value Date   LDLCALC 88 02/01/2018   Lab Results  Component Value Date   TRIG 124.0 02/01/2018   Lab Results  Component Value Date   CHOLHDL 3 02/01/2018   No results found for: HGBA1C    Assessment & Plan:   Problem List Items Addressed This Visit       Other   Anxiety   Relevant Medications   FLUoxetine (PROZAC) 20 MG tablet   Other Relevant Orders   Ambulatory referral to Psychology   Ambulatory referral to Psychiatry   Dysuria   Relevant Orders   POCT urinalysis dipstick (Completed)   Urine Culture  Vaginal discharge    Patinet complains about some vaginal discharge and vaginal buring. History of the same. She has had BV in the past. Will test for BV, yeast and STI at the request of patient.       Relevant Orders   WET PREP BY MOLECULAR PROBE   HIV Antibody (routine testing w rflx)   Preventative health care    Discussed screening exams with patient and vaccinations per patient age       Relevant Orders   Lipid panel   TSH   Comprehensive metabolic panel   CBC with Differential/Platelet   Depression, major, single episode, severe (HCC) - Primary    Patient has a history of depression. She did have a OD on zoloft when she was 13. Outside of that patinet has not had any inpatient treatment or self injury. Patient states that she has been on benzos in the past and was followed by psych and counseling. Referral placed for psych and counseling. Will start patient  on SSRI. Information on emergent behavioral health treatment options. No HI/SI/AVH      Relevant Medications   FLUoxetine (PROZAC) 20 MG tablet   Other Relevant Orders   Ambulatory referral to Psychology   Ambulatory referral to Psychiatry   Other Visit Diagnoses     Screening for cervical cancer       Relevant Orders   Cytology - PAP([place])   Routine screening for STI (sexually transmitted infection)       Relevant Orders   HIV Antibody (routine testing w rflx)       Meds ordered this encounter  Medications   FLUoxetine (PROZAC) 20 MG tablet    Sig: Take 1 tablet (20 mg total) by mouth daily.    Dispense:  30 tablet    Refill:  2    Order Specific Question:   Supervising Provider    Answer:   Roxy Manns A [[id number]]    Follow-up: No follow-ups on file.    Audria Nine, NP

## 2021-11-17 NOTE — Assessment & Plan Note (Signed)
Patinet complains about some vaginal discharge and vaginal buring. History of the same. She has had BV in the past. Will test for BV, yeast and STI at the request of patient.  ?

## 2021-11-18 ENCOUNTER — Telehealth: Payer: Self-pay

## 2021-11-18 LAB — HIV ANTIBODY (ROUTINE TESTING W REFLEX): HIV 1&2 Ab, 4th Generation: NONREACTIVE

## 2021-11-18 LAB — URINE CULTURE
MICRO NUMBER:: 13098234
SPECIMEN QUALITY:: ADEQUATE

## 2021-11-18 LAB — WET PREP BY MOLECULAR PROBE
Candida species: NOT DETECTED
Gardnerella vaginalis: NOT DETECTED
MICRO NUMBER:: 13098233
SPECIMEN QUALITY:: ADEQUATE
Trichomonas vaginosis: NOT DETECTED

## 2021-11-18 NOTE — Telephone Encounter (Signed)
She took the 1st fluoxetine last night. She did end up having nausea, diarrhea, and headache. Asking if she could be written out of work for a few days (to the end of the week) while her body adjusts to the medication. Please advise. Will need a note ?

## 2021-11-18 NOTE — Telephone Encounter (Signed)
She can try halving the medication for a week. If she is tolerating it ok then she can go to the full tablet of 20mg  a day. If she needs a note for today that is fine ?

## 2021-11-18 NOTE — Telephone Encounter (Signed)
Patient advised. Patient will try half tablet. Patient asked if she is still having issues tomorrow as below and she calls back will we give her a note to cover for her for work. I spoke with Taylor Bautista and this was approved. Patient will update me ?

## 2021-11-19 ENCOUNTER — Other Ambulatory Visit: Payer: Self-pay | Admitting: Nurse Practitioner

## 2021-11-19 DIAGNOSIS — N390 Urinary tract infection, site not specified: Secondary | ICD-10-CM

## 2021-11-19 MED ORDER — NITROFURANTOIN MONOHYD MACRO 100 MG PO CAPS: 100.0000 mg | ORAL_CAPSULE | Freq: Two times a day (BID) | ORAL | 0 refills | Status: AC

## 2021-11-19 NOTE — Progress Notes (Signed)
Orders only

## 2021-11-23 LAB — CYTOLOGY - PAP
Chlamydia: NEGATIVE
Comment: NEGATIVE
Comment: NEGATIVE
Comment: NEGATIVE
Comment: NORMAL
Diagnosis: UNDETERMINED — AB
High risk HPV: NEGATIVE
Neisseria Gonorrhea: NEGATIVE
Trichomonas: NEGATIVE

## 2021-12-09 ENCOUNTER — Other Ambulatory Visit: Payer: Self-pay | Admitting: Nurse Practitioner

## 2021-12-09 DIAGNOSIS — F419 Anxiety disorder, unspecified: Secondary | ICD-10-CM

## 2021-12-09 DIAGNOSIS — F322 Major depressive disorder, single episode, severe without psychotic features: Secondary | ICD-10-CM

## 2021-12-10 NOTE — Telephone Encounter (Signed)
Spoke with patient. Patient states she tried taking the 1/2 tablet of the medication and due to still having issues tolerating it-GI issues and felt really tired on the medication she stopped taking it after about 5 days. Patient states she is actually feeling ok emotionally and will keep [place] posted if anything changes.  ?

## 2021-12-23 ENCOUNTER — Encounter: Payer: Self-pay | Admitting: Family Medicine

## 2021-12-23 ENCOUNTER — Ambulatory Visit (INDEPENDENT_AMBULATORY_CARE_PROVIDER_SITE_OTHER): Payer: 59 | Admitting: Family Medicine

## 2021-12-23 VITALS — BP 112/68 | HR 80 | Temp 98.5°F | Ht 65.0 in | Wt 137.1 lb

## 2021-12-23 DIAGNOSIS — R1030 Lower abdominal pain, unspecified: Secondary | ICD-10-CM | POA: Diagnosis not present

## 2021-12-23 DIAGNOSIS — M25531 Pain in right wrist: Secondary | ICD-10-CM

## 2021-12-23 DIAGNOSIS — N760 Acute vaginitis: Secondary | ICD-10-CM

## 2021-12-23 DIAGNOSIS — N898 Other specified noninflammatory disorders of vagina: Secondary | ICD-10-CM | POA: Diagnosis not present

## 2021-12-23 DIAGNOSIS — R350 Frequency of micturition: Secondary | ICD-10-CM

## 2021-12-23 LAB — POC URINALSYSI DIPSTICK (AUTOMATED)
Bilirubin, UA: NEGATIVE
Blood, UA: NEGATIVE
Glucose, UA: NEGATIVE
Ketones, UA: NEGATIVE
Nitrite, UA: NEGATIVE
Protein, UA: NEGATIVE
Spec Grav, UA: 1.025 (ref 1.010–1.025)
Urobilinogen, UA: 0.2 E.U./dL
pH, UA: 6 (ref 5.0–8.0)

## 2021-12-23 LAB — POCT WET PREP (WET MOUNT): Trichomonas Wet Prep HPF POC: ABSENT

## 2021-12-23 MED ORDER — METRONIDAZOLE 0.75 % VA GEL
1.0000 | Freq: Every day | VAGINAL | 0 refills | Status: DC
Start: 2021-12-23 — End: 2022-07-19

## 2021-12-23 NOTE — Assessment & Plan Note (Signed)
Small leuk on ua ?Culture pending  ? ?Vaginitis dx and treated as well  ? ?

## 2021-12-23 NOTE — Assessment & Plan Note (Signed)
Suspect overuse injury of R wrist from working at the [employer]  ?Adv use of wrist splint prn  ?Also ice and relative rest when able  ?inst to update if not improved in 2 wk ?

## 2021-12-23 NOTE — Progress Notes (Signed)
? ?Subjective:  ? ? Patient ID: Taylor Bautista, female    DOB: 06-13-1991, 31 y.o.   MRN: [id number] ? ?HPI ?Pt presents for abd pain/back pain with recent uti  ?Also R wrist pain  ? ?Wt Readings from Last 3 Encounters:  ?12/23/21 137 lb 2 oz (62.2 kg)  ?11/17/21 134 lb 6 oz (61 kg)  ?08/31/21 143 lb (64.9 kg)  ? ?22.82 kg/m? ? ? ?Recent uti , treated with macrobid last month  ?Had group B strep  ?Felt better  ? ? ?Abd pain - low abd/both sides ?On /off all day  ?Hurts worse when bladder is full  ?No burning to urinate  ? ?Some frequency but also drinks a lot of water  ?No blood in urine  ?No n/v  ?No fever  ? ?Thick vaginal discharge  ?White ?Not clumpy like yeast  ?Is pre menstrual  ? ?Is monogamus  ?There is a h/o cheating in the past  ? ? ?Back pain - lower/middle but worse on R ?Not positional  ? ?Results for orders placed or performed in visit on 12/23/21  ?POCT Urinalysis Dipstick (Automated)  ?Result Value Ref Range  ? Color, UA Yellow   ? Clarity, UA Clear   ? Glucose, UA Negative Negative  ? Bilirubin, UA Negative   ? Ketones, UA Negative   ? Spec Grav, UA 1.025 1.010 - 1.025  ? Blood, UA Negative   ? pH, UA 6.0 5.0 - 8.0  ? Protein, UA Negative Negative  ? Urobilinogen, UA 0.2 0.2 or 1.0 E.U./dL  ? Nitrite, UA Negative   ? Leukocytes, UA Small (1+) (A) Negative  ?POCT Wet Prep [facility])  ?Result Value Ref Range  ? Source Wet Prep POC vaginal   ? WBC, Wet Prep HPF POC many   ? Bacteria Wet Prep HPF POC Many (A) Few  ? BACTERIA WET PREP MORPHOLOGY POC    ? Clue Cells Wet Prep HPF POC Moderate (A) None  ? Clue Cells Wet Prep Whiff POC    ? Yeast Wet Prep HPF POC None None  ? KOH Wet Prep POC None None  ? Trichomonas Wet Prep HPF POC Absent Absent  ? ? ? ? 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 ? ?Patient Active Problem List  ? Diagnosis Date Noted  ? Right wrist pain 12/23/2021  ? Depression, major, single episode,  severe (HCC) 11/17/2021  ? Family history of breast cancer in sister 07/06/2021  ? Abdominal cramping 05/05/2021  ? Daytime sleepiness 04/27/2021  ? High risk heterosexual behavior 04/08/2021  ? Acute vaginitis 04/08/2021  ? Onychomycosis 02/24/2021  ? Vitamin B 12 deficiency 12/20/2019  ? Pelvic pain 10/31/2019  ? Preventative health care 01/19/2019  ? Urinary frequency 10/21/2016  ? Vaginal discharge 10/19/2016  ? Dysuria 10/14/2016  ? 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 Anxiety 07/20/2013  ? Migraine without aura 12/05/2012  ? ?Past Medical History:  ?Diagnosis Date  ? Allergy   ? Anxiety   ? no meds  ? Dysmenorrhea   ? Dysthymic disorder   ? Exposure to STD   ? MRSA infection   ? Sleep apnea   ? pt states she does not use her CPAP   ? Tobacco use disorder   ? ?Past Surgical History:  ?Procedure Laterality Date  ? FOOT SURGERY  2016  ?  LAPAROSCOPIC TUBAL LIGATION Bilateral 08/02/2018  ? Procedure: LAPAROSCOPIC TUBAL LIGATION - Filshie Clips;  Surgeon: [place] BingPickens, Charlie, MD;  Location: [facility];  Service: Gynecology;  Laterality: Bilateral;  ? Pigmented nevus removal  04/1989  ? ?Social History  ? ?Tobacco Use  ? Smoking status: Former  ?  Packs/day: 0.50  ?  Years: 10.00  ?  Pack years: 5.00  ?  Types: Cigarettes  ?  Quit date: 09/27/2018  ?  Years since quitting: 3.2  ? Smokeless tobacco: Never  ?Vaping Use  ? Vaping Use: Never used  ?Substance Use Topics  ? Alcohol use: Yes  ?  Comment: very rare  ? Drug use: No  ?  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 ?Family History  ?Problem Relation Age of Onset  ? Breast cancer Mother 1939  ?     initial 3539, recurrence at 7369  ? Cancer Sister 2051  ?     colon cancer  ? Anxiety disorder Sister   ? Depression Sister   ? Cancer Sister 5423  ?     lymphnode cancer  ? Breast cancer Cousin 24  ?     breast  ? Anxiety disorder Other   ? ?No Known Allergies ?Current Outpatient Medications on File Prior to Visit   ?Medication Sig Dispense Refill  ? cyanocobalamin (,VITAMIN B-12,) 1000 MCG/ML injection INJECT 1 ML (1,000 MCG TOTAL) INTO THE MUSCLE EVERY 30 DAYS. 3 mL 0  ? ibuprofen (ADVIL) 800 MG tablet Take 1 tablet (800 mg total) by mouth every 8 (eight) hours as needed. 30 tablet 2  ? ?No current facility-administered medications on file prior to visit.  ?  ? ?Review of Systems  ?Constitutional:  Negative for activity change, appetite change, fatigue, fever and unexpected weight change.  ?HENT:  Negative for congestion, ear pain, rhinorrhea, sinus pressure and sore throat.   ?Eyes:  Negative for pain, redness and visual disturbance.  ?Respiratory:  Negative for cough, shortness of breath and wheezing.   ?Cardiovascular:  Negative for chest pain and palpitations.  ?Gastrointestinal:  Negative for abdominal pain, blood in stool, constipation and diarrhea.  ?Endocrine: Negative for polydipsia and polyuria.  ?Genitourinary:  Positive for pelvic pain and vaginal discharge. Negative for decreased urine volume, dyspareunia, dysuria, flank pain, frequency, urgency, vaginal bleeding and vaginal pain.  ?Musculoskeletal:  Negative for arthralgias, back pain and myalgias.  ?     R wrist pain   ?Skin:  Negative for pallor and rash.  ?Allergic/Immunologic: Negative for environmental allergies.  ?Neurological:  Negative for dizziness, syncope and headaches.  ?Hematological:  Negative for adenopathy. Does not bruise/bleed easily.  ?Psychiatric/Behavioral:  Negative for decreased concentration and dysphoric mood. The patient is not nervous/anxious.   ? ?   ?Objective:  ? Physical Exam ?Exam conducted with a chaperone present.  ?Constitutional:   ?   General: She is not in acute distress. ?   Appearance: She is well-developed and normal weight. She is not ill-appearing or diaphoretic.  ?HENT:  ?   Head: Normocephalic and atraumatic.  ?Eyes:  ?   Conjunctiva/sclera: Conjunctivae normal.  ?   Pupils: Pupils are equal, round, and reactive to  light.  ?Cardiovascular:  ?   Rate and Rhythm: Normal rate and regular rhythm.  ?   Heart sounds: Normal heart sounds.  ?Pulmonary:  ?   Effort: Pulmonary effort is normal. No respiratory distress.  ?   Breath sounds: Normal breath  sounds. No wheezing or rales.  ?Abdominal:  ?   General: Abdomen is flat. Bowel sounds are normal. There is no abdominal bruit.  ?   Palpations: Abdomen is soft. There is no hepatomegaly, splenomegaly, mass or pulsatile mass.  ?   Tenderness: There is abdominal tenderness in the suprapubic area. There is no right CVA tenderness, left CVA tenderness, guarding or rebound.  ?Genitourinary: ?   General: Normal vulva.  ?   Vagina: Vaginal discharge present. No erythema, tenderness or lesions.  ?   Cervix: No friability or erythema.  ?   Uterus: Not enlarged and not tender.   ?   Adnexa: Right adnexa normal and left adnexa normal.  ?   Comments: Very mild CMT ? ?Discharge is pale  ?Musculoskeletal:  ?   Cervical back: Neck supple.  ?   Comments: Mild lateral R wrist tenderness  ?Some popping with flexion  ?Nl rom  ?No erythema or swelling  ? ?  ?Lymphadenopathy:  ?   Cervical: No cervical adenopathy.  ?Skin: ?   General: Skin is warm and dry.  ?   Findings: No erythema or rash.  ?Neurological:  ?   Mental Status: She is alert.  ?   Motor: No weakness.  ?   Coordination: Coordination normal.  ?   Deep Tendon Reflexes: Reflexes normal.  ?   Comments: Neg tinel and phalen signs   ?Psychiatric:     ?   Mood and Affect: Mood normal.  ? ? ? ? ? ?   ?Assessment & Plan:  ? ?Problem List Items Addressed This Visit   ? ?  ? Genitourinary  ? Acute vaginitis - Primary  ?  Clue cells noted on wet prep  ?Will tx for BV with metro vaginal gel (she prefers this to oral)  ? ?This may cause her pelvic pain/cramping ?Gc/chlam screen sent also/pending  ?Update if not starting to improve in a week or if worsening   ?  ?  ?  ? Other  ? Right wrist pain  ?  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  ?  ? Urinary frequency  ?  Small leuk on ua ?Culture pending  ? ?Vaginitis dx and treated as

## 2021-12-23 NOTE — Patient Instructions (Addendum)
Get a carpal tunnel wrist splint at a pharmacy  ?Wear it when the pain happens  ?Use ice also for 10 minutes at a time  ? ?Update if not starting to improve in a week or if worsening   ? ?Use the vaginal gel for BV ?We have a urine culture pending  ?We have STD screen for gc and chlamydia also  ? ?If not improving let u sknow  ? ? ? ? ? ? ? ? ? ?

## 2021-12-23 NOTE — Assessment & Plan Note (Signed)
Clue cells on wet prep-will tx for BV and update if not improving  ?Gc/chlam screening also sent  ?

## 2021-12-23 NOTE — Assessment & Plan Note (Signed)
Clue cells noted on wet prep  ?Will tx for BV with metro vaginal gel (she prefers this to oral)  ? ?This may cause her pelvic pain/cramping ?Gc/chlam screen sent also/pending  ?Update if not starting to improve in a week or if worsening   ?

## 2021-12-24 LAB — C. TRACHOMATIS/N. GONORRHOEAE RNA
C. trachomatis RNA, TMA: NOT DETECTED
N. gonorrhoeae RNA, TMA: NOT DETECTED

## 2021-12-24 LAB — URINE CULTURE
MICRO NUMBER:: 13254176
SPECIMEN QUALITY:: ADEQUATE

## 2022-01-05 ENCOUNTER — Ambulatory Visit: Payer: 59 | Admitting: Psychology

## 2022-01-19 ENCOUNTER — Ambulatory Visit: Payer: Self-pay | Admitting: Psychology

## 2022-01-27 ENCOUNTER — Telehealth (HOSPITAL_COMMUNITY): Payer: 59 | Admitting: Psychiatry

## 2022-02-17 ENCOUNTER — Other Ambulatory Visit (HOSPITAL_COMMUNITY)
Admission: RE | Admit: 2022-02-17 | Discharge: 2022-02-17 | Disposition: A | Discharge status: Home / Self Care | Payer: 59 | Source: Ambulatory Visit | Attending: Family Medicine | Admitting: Family Medicine

## 2022-02-17 ENCOUNTER — Ambulatory Visit (INDEPENDENT_AMBULATORY_CARE_PROVIDER_SITE_OTHER): Payer: 59

## 2022-02-17 VITALS — BP 111/72 | HR 58 | Wt 133.0 lb

## 2022-02-17 DIAGNOSIS — N898 Other specified noninflammatory disorders of vagina: Secondary | ICD-10-CM | POA: Diagnosis not present

## 2022-02-17 NOTE — Progress Notes (Addendum)
SUBJECTIVE:  31 y.o. female who desires a STI screen. Possible BV, No bleeding or significant pelvic pain. No UTI symptoms. Denies history of known exposure to STD.  No LMP recorded.  OBJECTIVE:  She appears well.   ASSESSMENT:  STI Screen   PLAN:  Pt offered STI blood screening-not indicated GC, chlamydia, and trichomonas probe sent to lab. Pt wants BV and yeast testing as well.  Treatment: To be determined once lab results are received. Pt states if +BV please send yeast x 2 pill treatment.  Pt follow up as needed.

## 2022-02-22 LAB — CERVICOVAGINAL ANCILLARY ONLY
Bacterial Vaginitis (gardnerella): NEGATIVE
Candida Glabrata: NEGATIVE
Candida Vaginitis: POSITIVE — AB
Chlamydia: NEGATIVE
Comment: NEGATIVE
Comment: NEGATIVE
Comment: NEGATIVE
Comment: NEGATIVE
Comment: NEGATIVE
Comment: NORMAL
Neisseria Gonorrhea: NEGATIVE
Trichomonas: NEGATIVE

## 2022-02-23 MED ORDER — FLUCONAZOLE 150 MG PO TABS: 150.0000 mg | ORAL_TABLET | Freq: Once | ORAL | 2 refills | Status: AC

## 2022-02-23 NOTE — Progress Notes (Signed)
Wet prep is c/w yeast--rx sent in

## 2022-02-23 NOTE — Addendum Note (Signed)
Addended by: Donnamae Jude on: 02/23/2022 11:11 AM   Modules accepted: Orders

## 2022-03-09 ENCOUNTER — Other Ambulatory Visit (HOSPITAL_COMMUNITY)
Admission: RE | Admit: 2022-03-09 | Discharge: 2022-03-09 | Disposition: A | Discharge status: Home / Self Care | Payer: 59 | Source: Ambulatory Visit | Attending: Obstetrics & Gynecology | Admitting: Obstetrics & Gynecology

## 2022-03-09 ENCOUNTER — Ambulatory Visit (INDEPENDENT_AMBULATORY_CARE_PROVIDER_SITE_OTHER): Payer: 59

## 2022-03-09 VITALS — BP 103/70 | HR 62 | Wt 135.2 lb

## 2022-03-09 DIAGNOSIS — N898 Other specified noninflammatory disorders of vagina: Secondary | ICD-10-CM | POA: Diagnosis present

## 2022-03-09 NOTE — Progress Notes (Signed)
SUBJECTIVE:  31 y.o. female who desires a STI screen. Denies abnormal vaginal discharge, bleeding or significant pelvic pain. No UTI symptoms. Denies history of known exposure to STD.  No LMP recorded.  OBJECTIVE:  She appears well.   ASSESSMENT:  STI Screen   PLAN:  Pt offered STI blood screening-not indicated GC, chlamydia, and trichomonas probe sent to lab. BV and yeast added .  Treatment: To be determined once lab results are received.  Pt follow up as needed.

## 2022-03-11 LAB — CERVICOVAGINAL ANCILLARY ONLY
Bacterial Vaginitis (gardnerella): NEGATIVE
Candida Glabrata: NEGATIVE
Candida Vaginitis: NEGATIVE
Chlamydia: NEGATIVE
Comment: NEGATIVE
Comment: NEGATIVE
Comment: NEGATIVE
Comment: NEGATIVE
Comment: NEGATIVE
Comment: NORMAL
Neisseria Gonorrhea: NEGATIVE
Trichomonas: NEGATIVE

## 2022-05-03 ENCOUNTER — Other Ambulatory Visit (HOSPITAL_COMMUNITY)
Admission: RE | Admit: 2022-05-03 | Discharge: 2022-05-03 | Disposition: A | Discharge status: Home / Self Care | Payer: 59 | Source: Ambulatory Visit | Attending: Family Medicine | Admitting: Family Medicine

## 2022-05-03 ENCOUNTER — Ambulatory Visit (INDEPENDENT_AMBULATORY_CARE_PROVIDER_SITE_OTHER): Payer: 59

## 2022-05-03 VITALS — BP 102/69 | HR 70 | Wt 131.0 lb

## 2022-05-03 DIAGNOSIS — Z113 Encounter for screening for infections with a predominantly sexual mode of transmission: Secondary | ICD-10-CM | POA: Diagnosis not present

## 2022-05-03 DIAGNOSIS — N898 Other specified noninflammatory disorders of vagina: Secondary | ICD-10-CM

## 2022-05-03 NOTE — Progress Notes (Signed)
SUBJECTIVE:  31 y.o. female who desires a STI screen. Pt c/o abnormal vaginal discharge, No bleeding or significant pelvic pain. No UTI symptoms. Denies history of known exposure to STD.  No LMP recorded.  OBJECTIVE:  She appears well.   ASSESSMENT:  STI Screen   PLAN:  Pt offered STI blood screening-not indicated GC, chlamydia, and trichomonas probe sent to lab. +BV and Yeast . Treatment: To be determined once lab results are received.  Pt follow up as needed.

## 2022-05-04 LAB — CERVICOVAGINAL ANCILLARY ONLY
Bacterial Vaginitis (gardnerella): NEGATIVE
Candida Glabrata: NEGATIVE
Candida Vaginitis: NEGATIVE
Chlamydia: NEGATIVE
Comment: NEGATIVE
Comment: NEGATIVE
Comment: NEGATIVE
Comment: NEGATIVE
Comment: NEGATIVE
Comment: NORMAL
Neisseria Gonorrhea: NEGATIVE
Trichomonas: NEGATIVE

## 2022-05-28 ENCOUNTER — Other Ambulatory Visit: Payer: Self-pay | Admitting: Nurse Practitioner

## 2022-07-01 IMAGING — MG MM DIGITAL SCREENING BILAT W/ TOMO AND CAD
8 series · 9 of 24 positions shown · non-contrast
Comparison: None.

CLINICAL DATA: Screening. Family history of breast cancer, mother
with breast cancer age 39.

EXAM:
DIGITAL SCREENING BILATERAL MAMMOGRAM WITH TOMOSYNTHESIS AND CAD
TECHNIQUE: Bilateral screening digital craniocaudal and mediolateral oblique
mammograms were obtained. Bilateral screening digital breast
tomosynthesis was performed. The images were evaluated with
computer-aided detection.

[L CC synth-2D]
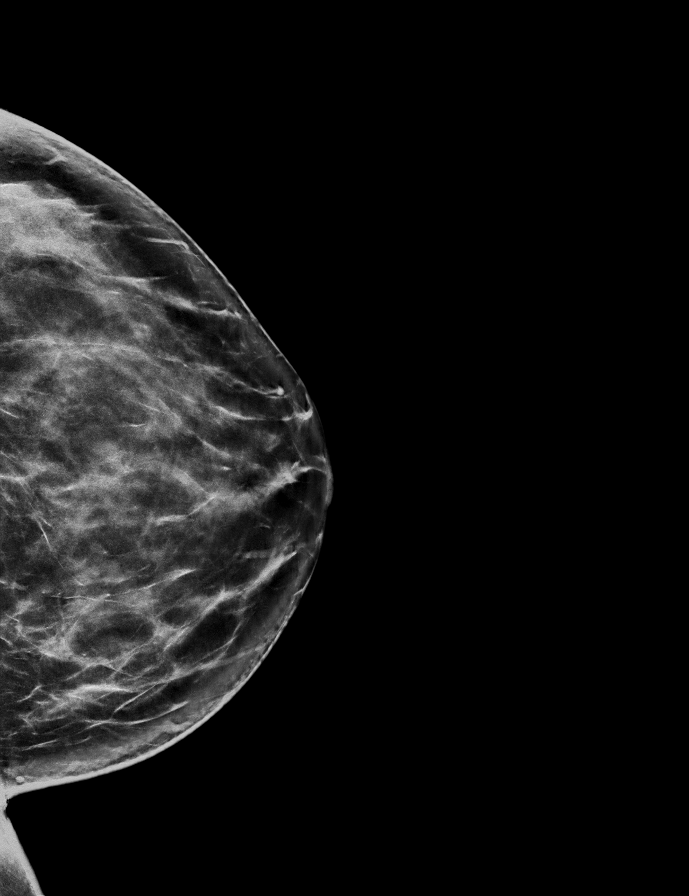

[R CC synth-2D]
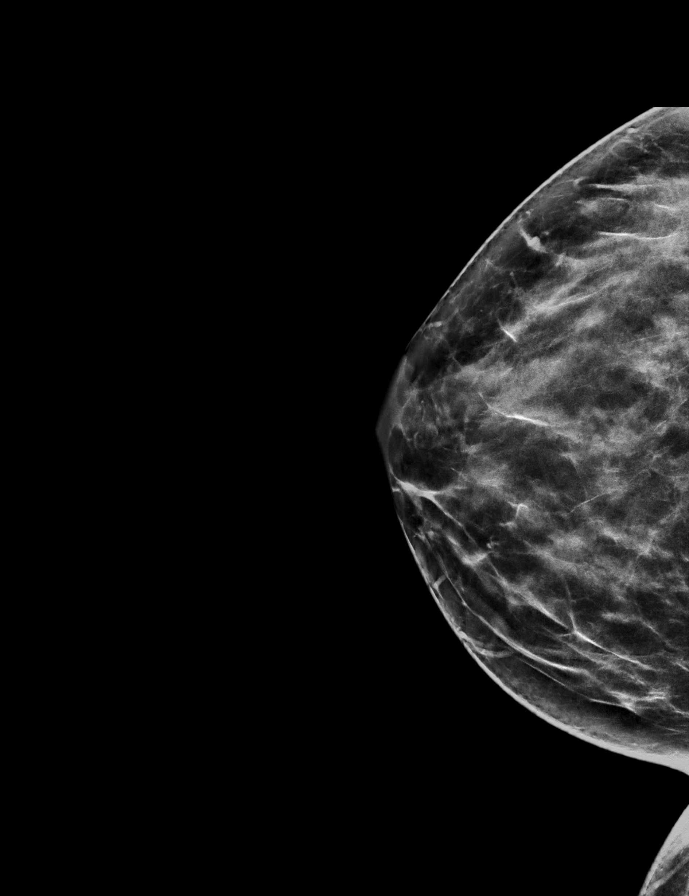

[R MLO synth-2D]
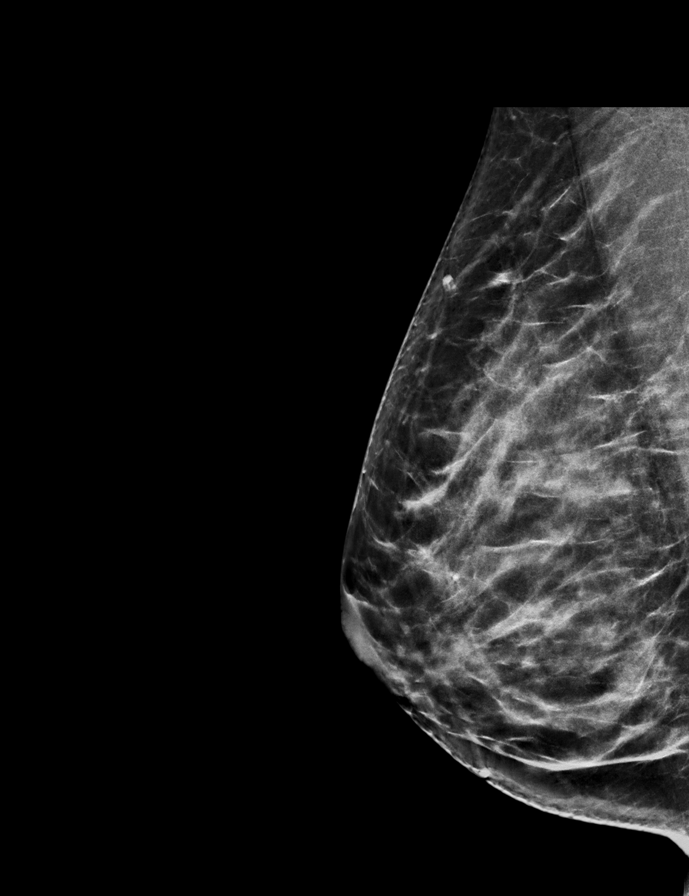

[L MLO synth-2D]
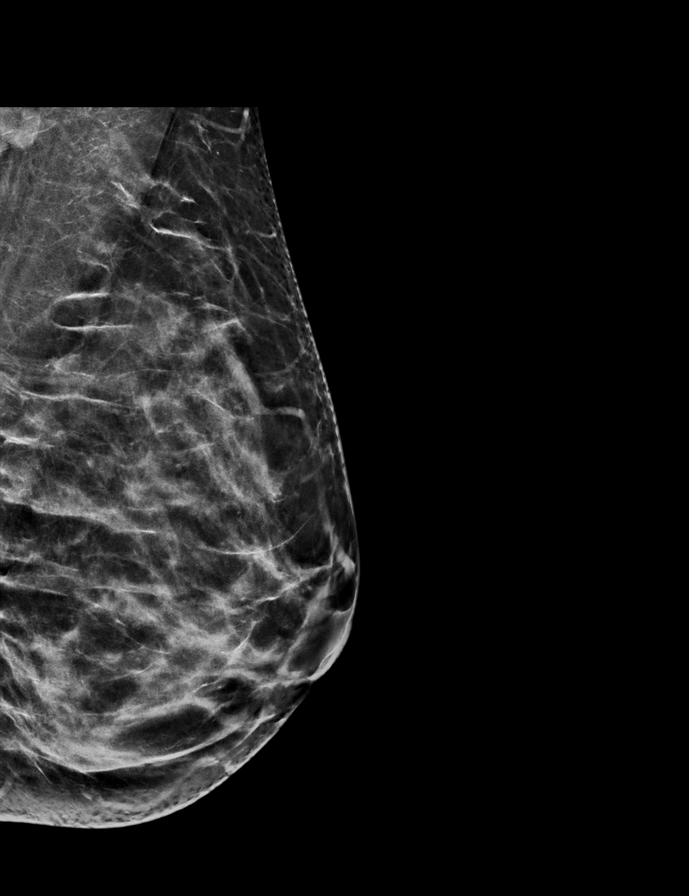

[L CC tomo · 2 of 72 frames shown]
[frame 24/72]
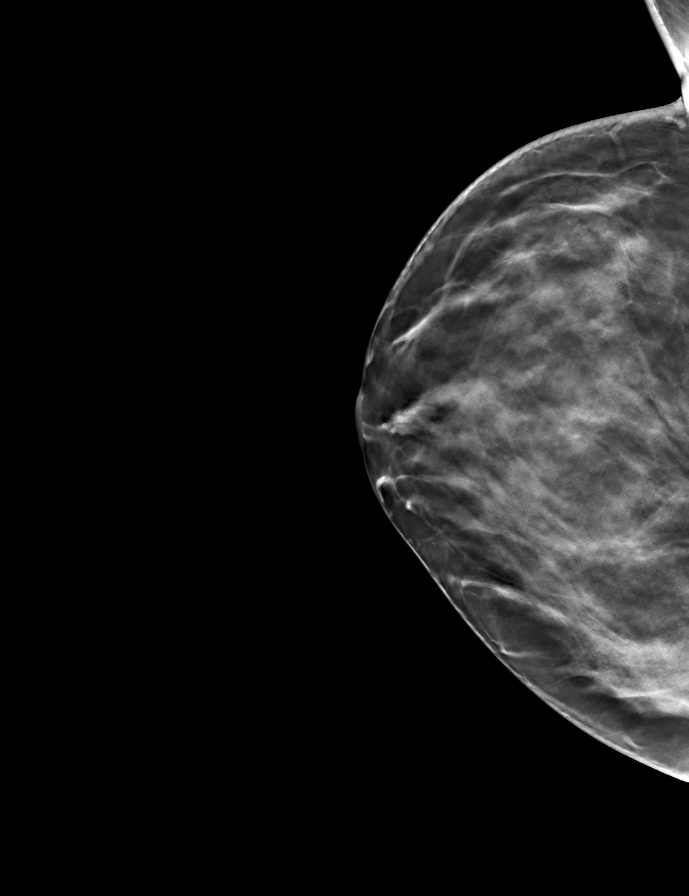
[frame 37/72]
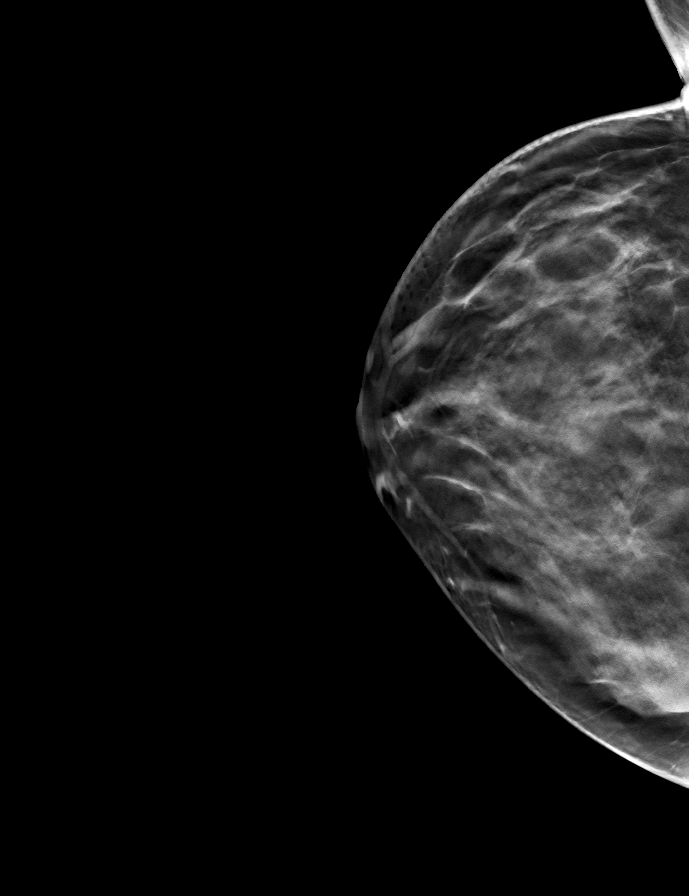

[R CC tomo · tomo slice 33/64.0]
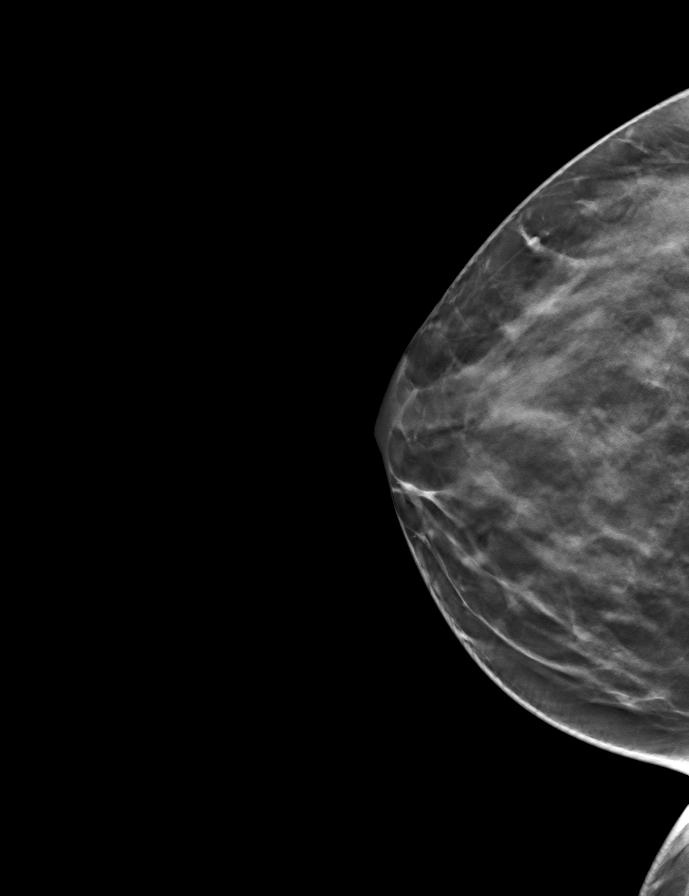

[R MLO tomo · tomo slice 35/69.0]
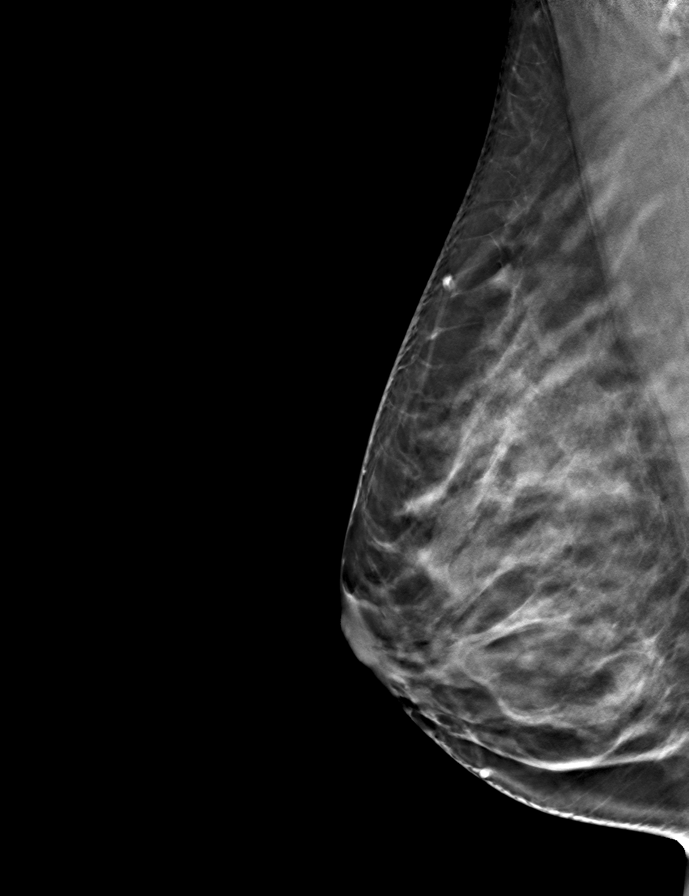

[L MLO tomo · tomo slice 33/64.0]
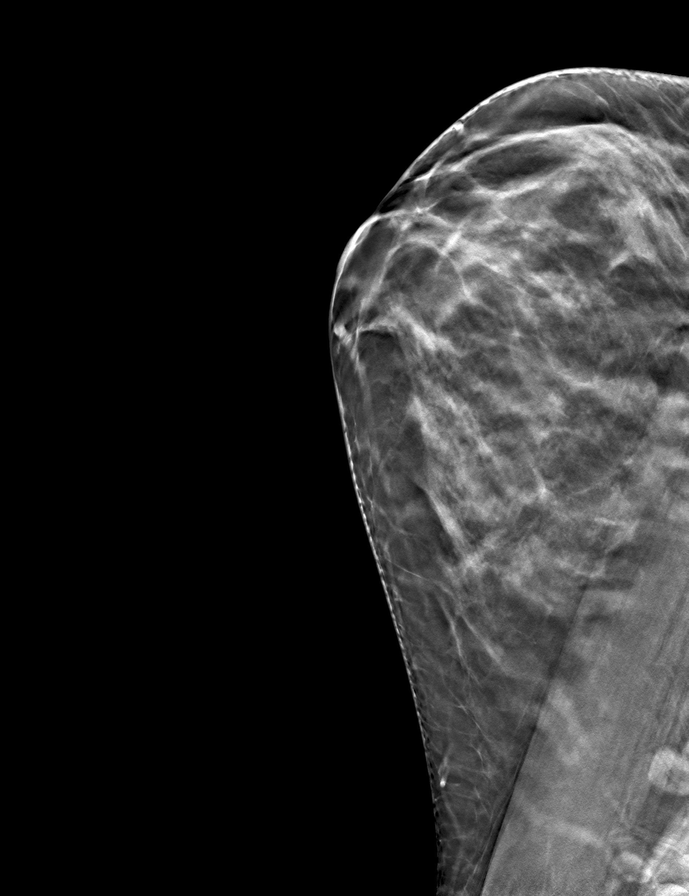

[9 of 24 positions shown; findings below may reference images not displayed]

ACR Breast Density Category d: The breast tissue is extremely dense,
which lowers the sensitivity of mammography.
FINDINGS: There are no findings suspicious for malignancy.
IMPRESSION: No mammographic evidence of malignancy. A result letter of this
screening mammogram will be mailed directly to the patient.

RECOMMENDATION:
Screening mammogram in 1 year.

BI-RADS CATEGORY  1: Negative.

## 2022-07-19 ENCOUNTER — Ambulatory Visit (INDEPENDENT_AMBULATORY_CARE_PROVIDER_SITE_OTHER): Payer: 59 | Admitting: Family Medicine

## 2022-07-19 ENCOUNTER — Encounter: Payer: Self-pay | Admitting: Family Medicine

## 2022-07-19 VITALS — BP 90/62 | HR 88 | Temp 98.3°F | Ht 65.0 in | Wt 141.0 lb

## 2022-07-19 DIAGNOSIS — R350 Frequency of micturition: Secondary | ICD-10-CM

## 2022-07-19 DIAGNOSIS — N898 Other specified noninflammatory disorders of vagina: Secondary | ICD-10-CM

## 2022-07-19 DIAGNOSIS — M545 Low back pain, unspecified: Secondary | ICD-10-CM

## 2022-07-19 LAB — POC URINALSYSI DIPSTICK (AUTOMATED)
Bilirubin, UA: NEGATIVE
Blood, UA: NEGATIVE
Glucose, UA: NEGATIVE
Ketones, UA: NEGATIVE
Leukocytes, UA: NEGATIVE
Nitrite, UA: NEGATIVE
Protein, UA: NEGATIVE
Spec Grav, UA: 1.015 (ref 1.010–1.025)
Urobilinogen, UA: 0.2 E.U./dL
pH, UA: 6.5 (ref 5.0–8.0)

## 2022-07-19 MED ORDER — TIZANIDINE HCL 4 MG PO TABS: 4.0000 mg | ORAL_TABLET | Freq: Every evening | ORAL | 1 refills | Status: DC | PRN

## 2022-07-19 MED ORDER — DICLOFENAC SODIUM 75 MG PO TBEC: 75.0000 mg | DELAYED_RELEASE_TABLET | Freq: Two times a day (BID) | ORAL | 1 refills | Status: DC

## 2022-07-19 NOTE — Progress Notes (Signed)
Taylor Yebra T. Jacier Gladu, MD, [place] [facility] [place], [address]  Phone: [phone]  FAX: [place] - 31 y.o. female  MRN [id number]  Date of Birth: [birth year]  Date: 07/19/2022  PCP: Michela Pitcher, NP  Referral: Michela Pitcher, NP  Chief Complaint  Patient presents with   Back Pain    Lower-started last Friday   Urinary Frequency   Subjective:   Taylor Bautista is a 31 y.o. very pleasant female patient with Body mass index is 23.46 kg/m. who presents with the following:  Patient presents with abdominal and back pain.  She wonders if this could be from UTI.  Starting last Thursday - lowest part of back, both sides.  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  Bending and lifting all the time.   She has not had any kind of specific injury or at all, but she does do a lot of repetitive motion tasks, lifting and twisting at work.  Pain is worse on the left lower back, and she does not describe any numbness, tingling, radiculopathy.  She is not have any bowel or bladder incontinence, no sensory changes.  She has been going to the bathroom more frequently, she questions whether she could have BV, yeast, or UTI.  She does have some frequently acquired BV infection. No std exposures - no partners.   Will get BV pretty frequently.  Also gets yeast infections pretty frequently.   Check wet prep  Review of Systems is noted in the HPI, as appropriate  Objective:   BP 90/62   Pulse 88   Temp 98.3 F (36.8 C) (Oral)   Ht 5\' 5"  (1.651 m)   Wt 141 lb (64 kg)   LMP 07/05/2022   SpO2 100%   BMI 23.46 kg/m   GEN: No acute distress; alert,appropriate. PULM: Breathing comfortably in no respiratory distress PSYCH: Normally interactive.  This portion of the physical examination was chaperoned by Hedy Camara, CMA.  Normal introitus, swab was taken.   Range of motion at  the  waist: Flexion: normal Extension: normal Lateral bending: normal Rotation: all normal  No echymosis or edema Rises to examination table with no difficulty Gait: non antalgic  Inspection/Deformity: N Paraspinus Tenderness: L4-S1, left greater than right  B Ankle Dorsiflexion (L5,4): 5/5 B Great Toe Dorsiflexion (L5,4): 5/5 Heel Walk (L5): WNL Toe Walk (S1): WNL Rise/Squat (L4): WNL  SENSORY B Medial Foot (L4): WNL B Dorsum (L5): WNL B Lateral (S1): WNL Light Touch: WNL Pinprick: WNL  B SLR, seated: neg B SLR, supine: neg B FABER: neg B Reverse FABER: neg B Greater Troch: NT B Log Roll: neg B Sciatic Notch: NT   Laboratory and Imaging Data: Results for orders placed or performed in visit on 07/19/22  POCT Urinalysis Dipstick (Automated)  Result Value Ref Range   Color, UA Yellow    Clarity, UA Clear    Glucose, UA Negative Negative   Bilirubin, UA Negative    Ketones, UA Negative    Spec Grav, UA 1.015 1.010 - 1.025   Blood, UA Negative    pH, UA 6.5 5.0 - 8.0   Protein, UA Negative Negative   Urobilinogen, UA 0.2 0.2 or 1.0 E.U./dL   Nitrite, UA Negative    Leukocytes, UA Negative Negative     Assessment and Plan:     ICD-10-CM   1. Acute left-sided low  back pain without sciatica  M54.50     2. Urinary frequency  R35.0 POCT Urinalysis Dipstick (Automated)    Urine Culture    3. Vaginal discharge  N89.8 WET PREP BY MOLECULAR PROBE     Acute, nonradicular back pain.  Typically these things will improve roughly 75% of the time about 3 weeks.  I am going to have the patient continue with basic motion, start some Zanaflex and Voltaren.  Discharge versus urinary frequency, we will get a culture to be sure, we will also send a wet prep out.  Historically, she gets BV fairly frequently.  Medication Management during today's office visit: Meds ordered this encounter  Medications   diclofenac (VOLTAREN) 75 MG EC tablet    Sig: Take 1 tablet (75 mg total) by  mouth 2 (two) times daily.    Dispense:  60 tablet    Refill:  1   tiZANidine (ZANAFLEX) 4 MG tablet    Sig: Take 1 tablet (4 mg total) by mouth at bedtime as needed for muscle spasms.    Dispense:  30 tablet    Refill:  1   Medications Discontinued During This Encounter  Medication Reason   metroNIDAZOLE (METROGEL VAGINAL) 0.75 % vaginal gel Completed Course    Orders placed today for conditions managed today: Orders Placed This Encounter  Procedures   WET PREP BY MOLECULAR PROBE   Urine Culture   POCT Urinalysis Dipstick (Automated)    Disposition: No follow-ups on file.  [employer] One speech-to-text software was used for transcription in this dictation.  Possible transcriptional errors can occur using Animal nutritionist.   Signed,  Elpidio Galea. Nuvia Hileman, MD   Outpatient Encounter Medications as of 07/19/2022  Medication Sig   cyanocobalamin (,VITAMIN B-12,) 1000 MCG/ML injection INJECT 1 ML (1,000 MCG TOTAL) INTO THE MUSCLE EVERY 30 DAYS.   diclofenac (VOLTAREN) 75 MG EC tablet Take 1 tablet (75 mg total) by mouth 2 (two) times daily.   ibuprofen (ADVIL) 800 MG tablet Take 1 tablet (800 mg total) by mouth every 8 (eight) hours as needed.   tiZANidine (ZANAFLEX) 4 MG tablet Take 1 tablet (4 mg total) by mouth at bedtime as needed for muscle spasms.   [DISCONTINUED] metroNIDAZOLE (METROGEL VAGINAL) 0.75 % vaginal gel Place 1 Applicatorful vaginally at bedtime. For five days (Patient not taking: Reported on 03/09/2022)   No facility-administered encounter medications on file as of 07/19/2022.

## 2022-07-20 LAB — WET PREP BY MOLECULAR PROBE
Candida species: NOT DETECTED
Gardnerella vaginalis: NOT DETECTED
MICRO NUMBER:: 14148872
SPECIMEN QUALITY:: ADEQUATE
Trichomonas vaginosis: NOT DETECTED

## 2022-07-20 LAB — URINE CULTURE
MICRO NUMBER:: 14148873
Result:: NO GROWTH
SPECIMEN QUALITY:: ADEQUATE

## 2022-08-02 ENCOUNTER — Other Ambulatory Visit: Payer: Self-pay

## 2022-08-02 ENCOUNTER — Emergency Department (HOSPITAL_COMMUNITY)
Admission: EM | Admit: 2022-08-02 | Discharge: 2022-08-02 | Disposition: A | Discharge status: Home / Self Care | Payer: 59 | Attending: Emergency Medicine | Admitting: Emergency Medicine

## 2022-08-02 DIAGNOSIS — Z77098 Contact with and (suspected) exposure to other hazardous, chiefly nonmedicinal, chemicals: Secondary | ICD-10-CM | POA: Insufficient documentation

## 2022-08-02 DIAGNOSIS — R519 Headache, unspecified: Secondary | ICD-10-CM | POA: Diagnosis present

## 2022-08-02 DIAGNOSIS — Z7729 Contact with and (suspected ) exposure to other hazardous substances: Secondary | ICD-10-CM

## 2022-08-02 LAB — CBC WITH DIFFERENTIAL/PLATELET
Abs Immature Granulocytes: 0.01 10*3/uL (ref 0.00–0.07)
Basophils Absolute: 0 10*3/uL (ref 0.0–0.1)
Basophils Relative: 0 %
Eosinophils Absolute: 0.1 10*3/uL (ref 0.0–0.5)
Eosinophils Relative: 2 %
HCT: 37.9 % (ref 36.0–46.0)
Hemoglobin: 13 g/dL (ref 12.0–15.0)
Immature Granulocytes: 0 %
Lymphocytes Relative: 26 %
Lymphs Abs: 1.4 10*3/uL (ref 0.7–4.0)
MCH: 31.8 pg (ref 26.0–34.0)
MCHC: 34.3 g/dL (ref 30.0–36.0)
MCV: 92.7 fL (ref 80.0–100.0)
Monocytes Absolute: 0.5 10*3/uL (ref 0.1–1.0)
Monocytes Relative: 10 %
Neutro Abs: 3.3 10*3/uL (ref 1.7–7.7)
Neutrophils Relative %: 62 %
Platelets: 253 10*3/uL (ref 150–400)
RBC: 4.09 MIL/uL (ref 3.87–5.11)
RDW: 12 % (ref 11.5–15.5)
WBC: 5.4 10*3/uL (ref 4.0–10.5)
nRBC: 0 % (ref 0.0–0.2)

## 2022-08-02 LAB — COMPREHENSIVE METABOLIC PANEL
ALT: 29 U/L (ref 0–44)
AST: 24 U/L (ref 15–41)
Albumin: 3.7 g/dL (ref 3.5–5.0)
Alkaline Phosphatase: 46 U/L (ref 38–126)
Anion gap: 7 (ref 5–15)
BUN: 17 mg/dL (ref 6–20)
CO2: 27 mmol/L (ref 22–32)
Calcium: 8.8 mg/dL — ABNORMAL LOW (ref 8.9–10.3)
Chloride: 107 mmol/L (ref 98–111)
Creatinine, Ser: 0.61 mg/dL (ref 0.44–1.00)
GFR, Estimated: >60 mL/min (ref >60)
Glucose, Bld: 97 mg/dL (ref 70–99)
Potassium: 3.9 mmol/L (ref 3.5–5.1)
Sodium: 141 mmol/L (ref 135–145)
Total Bilirubin: 0.6 mg/dL (ref 0.3–1.2)
Total Protein: 6 g/dL — ABNORMAL LOW (ref 6.5–8.1)

## 2022-08-02 LAB — I-STAT VENOUS BLOOD GAS, ED
Acid-Base Excess: 1 mmol/L (ref 0.0–2.0)
Bicarbonate: 27.7 mmol/L (ref 20.0–28.0)
Calcium, Ion: 1.22 mmol/L (ref 1.15–1.40)
HCT: 37 % (ref 36.0–46.0)
Hemoglobin: 12.6 g/dL (ref 12.0–15.0)
O2 Saturation: 71 %
Potassium: 3.9 mmol/L (ref 3.5–5.1)
Sodium: 142 mmol/L (ref 135–145)
TCO2: 29 mmol/L (ref 22–32)
pCO2, Ven: 50.8 mmHg (ref 44–60)
pH, Ven: 7.345 (ref 7.25–7.43)
pO2, Ven: 40 mmHg (ref 32–45)

## 2022-08-02 LAB — CARBOXYHEMOGLOBIN - COOX: Carboxyhemoglobin: 1.7 % — ABNORMAL HIGH (ref 0.5–1.5)

## 2022-08-02 LAB — I-STAT BETA HCG BLOOD, ED (MC, WL, AP ONLY): I-stat hCG, quantitative: <5 m[IU]/mL (ref <5)

## 2022-08-02 LAB — LACTIC ACID, PLASMA: Lactic Acid, Venous: 0.8 mmol/L (ref 0.5–1.9)

## 2022-08-02 LAB — CK: Total CK: 72 U/L (ref 38–234)
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
## 2022-09-20 ENCOUNTER — Encounter: Payer: Self-pay | Admitting: Family

## 2022-09-20 ENCOUNTER — Ambulatory Visit (INDEPENDENT_AMBULATORY_CARE_PROVIDER_SITE_OTHER): Payer: 59 | Admitting: Family

## 2022-09-20 VITALS — BP 100/62 | HR 98 | Temp 98.6°F | Ht 65.0 in | Wt 132.4 lb

## 2022-09-20 DIAGNOSIS — L299 Pruritus, unspecified: Secondary | ICD-10-CM | POA: Diagnosis not present

## 2022-09-20 DIAGNOSIS — G43019 Migraine without aura, intractable, without status migrainosus: Secondary | ICD-10-CM

## 2022-09-20 MED ORDER — MOMETASONE FUROATE 0.1 % EX CREA: 1.0000 | TOPICAL_CREAM | Freq: Every day | CUTANEOUS | 0 refills | Status: DC

## 2022-09-20 NOTE — Patient Instructions (Signed)
Start magnesium citrate 400mg daily.    Often as a daily preventative, I recommend taking magnesium citrate with riboflavin 400mg daily and CoQ10 100mg three times daily. You may find these supplements over the counter.    Please let me know how you are doing.   

## 2022-09-20 NOTE — Assessment & Plan Note (Addendum)
HA is chronic, occurring 2-3 times per week. Reassuring neurologic exam however in the setting of positional headache, chronic left ear pain, we have opted to pursue MRI brain with IAC.  Advised trial of magnesium citrate 400mg  daily.  Consider Effexor.  Referral ophthalmology for eye exam emphasized importance of this exam.

## 2022-09-20 NOTE — Progress Notes (Signed)
Assessment & Plan:  Itching of ear Assessment & Plan: Presentation consistent with atopic dermatitis.  Provided with Elocon.  If no improvement, plan to refer to ENT   Orders: -     Mometasone Furoate; Apply 1 Application topically daily. Appear pea sized ( or less) amount to external ear, and slightly in canal.  Dispense: 45 g; Refill: 0  Intractable migraine without aura and without status migrainosus Assessment & Plan: HA is chronic, occurring 2-3 times per week. Reassuring neurologic exam however in the setting of positional headache, chronic left ear pain, we have opted to pursue MRI brain with IAC.  Advised trial of magnesium citrate 400mg  daily.  Consider Effexor.  Referral ophthalmology for eye exam emphasized importance of this exam.  Orders: -     Ambulatory referral to Ophthalmology -     MR BRAIN/IAC W WO CONTRAST; Future     Return precautions given.   Risks, benefits, and alternatives of the medications and treatment plan prescribed today were discussed, and patient expressed understanding.   Education regarding symptom management and diagnosis given to patient on AVS either electronically or printed.  Return in about 1 month (around 10/21/2022).  Mable Paris, FNP  Subjective:    Patient ID: Taylor Bautista, female    DOB: 1990-09-15, 32 y.o.   MRN: [id number]  CC: Taylor Bautista is a 32 y.o. female who presents today for an acute visit.    HPI: Complains of left ear pain for years, unchanged.   Itching. She will flakes in her ear come out.   Tried olive oil and cocobutter without relief  No fever, chills, sinus congestion  Complains frontal HA, for years.  HA started in her 84's. HA similar to prior HA.  H/o migraine. She had been using 800mg  ibuprofen twice per week. HA worse when bending over. No HA with valsalva.   She hasn't eyes checked. She is planning eye exam.   Sleeping well. No caffeine.   No vision loss. Harder to see further away,  and she no longer is driving at night due to difficulty seeing.       Allergies: Patient has no known allergies. Current Outpatient Medications on File Prior to Visit  Medication Sig Dispense Refill   cyanocobalamin (,VITAMIN B-12,) 1000 MCG/ML injection INJECT 1 ML (1,000 MCG TOTAL) INTO THE MUSCLE EVERY 30 DAYS. 3 mL 0   diclofenac (VOLTAREN) 75 MG EC tablet Take 1 tablet (75 mg total) by mouth 2 (two) times daily. 60 tablet 1   ibuprofen (ADVIL) 800 MG tablet Take 1 tablet (800 mg total) by mouth every 8 (eight) hours as needed. 30 tablet 2   tiZANidine (ZANAFLEX) 4 MG tablet Take 1 tablet (4 mg total) by mouth at bedtime as needed for muscle spasms. 30 tablet 1   No current facility-administered medications on file prior to visit.    Review of Systems  Constitutional:  Negative for chills and fever.  HENT:  Positive for ear discharge (flakes) and ear pain. Negative for congestion.   Eyes:  Positive for visual disturbance.  Respiratory:  Negative for cough.   Cardiovascular:  Negative for chest pain and palpitations.  Gastrointestinal:  Negative for nausea and vomiting.  Neurological:  Positive for headaches.      Objective:    BP 100/62   Pulse 98   Temp 98.6 F (37 C)   Ht 5\' 5"  (1.651 m)   Wt 132 lb 6.4 oz (60.1 kg)   SpO2  97%   BMI 22.03 kg/m   BP Readings from Last 3 Encounters:  09/20/22 100/62  08/02/22 109/66  07/19/22 90/62   Wt Readings from Last 3 Encounters:  09/20/22 132 lb 6.4 oz (60.1 kg)  07/19/22 141 lb (64 kg)  05/03/22 131 lb (59.4 kg)    Physical Exam Vitals reviewed.  Constitutional:      Appearance: She is well-developed.  HENT:     Head: Normocephalic and atraumatic.     Right Ear: Hearing, tympanic membrane, ear canal and external ear normal. No decreased hearing noted. No drainage, swelling or tenderness. No middle ear effusion. No foreign body. Tympanic membrane is not erythematous or bulging.     Left Ear: Hearing, tympanic  membrane and ear canal normal. No decreased hearing noted. No drainage, swelling or tenderness.  No middle ear effusion. No foreign body. Tympanic membrane is not erythematous or bulging.     Ears:     Comments: Dry flakes seen external left ear canal    Nose: Nose normal. No rhinorrhea.     Right Sinus: No maxillary sinus tenderness or frontal sinus tenderness.     Left Sinus: No maxillary sinus tenderness or frontal sinus tenderness.     Mouth/Throat:     Pharynx: Uvula midline. No oropharyngeal exudate or posterior oropharyngeal erythema.     Tonsils: No tonsillar abscesses.  Eyes:     Conjunctiva/sclera: Conjunctivae normal.     Pupils: Pupils are equal, round, and reactive to light.     Comments: Fundus normal bilaterally.   Cardiovascular:     Rate and Rhythm: Normal rate and regular rhythm.     Pulses: Normal pulses.     Heart sounds: Normal heart sounds.  Pulmonary:     Effort: Pulmonary effort is normal.     Breath sounds: Normal breath sounds. No wheezing, rhonchi or rales.  Lymphadenopathy:     Head:     Right side of head: No submental, submandibular, tonsillar, preauricular, posterior auricular or occipital adenopathy.     Left side of head: No submental, submandibular, tonsillar, preauricular, posterior auricular or occipital adenopathy.     Cervical: No cervical adenopathy.  Skin:    General: Skin is warm and dry.  Neurological:     Mental Status: She is alert.     Cranial Nerves: No cranial nerve deficit.     Sensory: No sensory deficit.     Deep Tendon Reflexes:     Reflex Scores:      Bicep reflexes are 2+ on the right side and 2+ on the left side.      Patellar reflexes are 2+ on the right side and 2+ on the left side.    Comments: Grip equal and strong bilateral upper extremities. Gait strong and steady. Able to perform rapid alternating movement without difficulty.   Psychiatric:        Speech: Speech normal.        Behavior: Behavior normal.        Thought  Content: Thought content normal.

## 2022-09-20 NOTE — Assessment & Plan Note (Signed)
Presentation consistent with atopic dermatitis.  Provided with Elocon.  If no improvement, plan to refer to ENT

## 2022-09-30 ENCOUNTER — Ambulatory Visit
Admission: RE | Admit: 2022-09-30 | Discharge: 2022-09-30 | Disposition: A | Discharge status: Home / Self Care | Payer: 59 | Source: Ambulatory Visit | Attending: Family | Admitting: Family

## 2022-09-30 DIAGNOSIS — G43019 Migraine without aura, intractable, without status migrainosus: Secondary | ICD-10-CM

## 2022-09-30 MED ORDER — GADOBUTROL 1 MMOL/ML IV SOLN
6.0000 mL | Freq: Once | INTRAVENOUS | Status: AC | PRN
  Administered 2022-09-30: 6 mL via INTRAVENOUS

## 2022-10-13 ENCOUNTER — Telehealth: Payer: Self-pay | Admitting: Nurse Practitioner

## 2022-10-13 NOTE — Telephone Encounter (Signed)
Spoke with [facility], where referral was sent. She states they have not received a referral for this patient. She does have a referral for a Herma with a different last name, same DOB and address of [place] vs [address] reviewed ins cards and DL in chart and all names shows Timoney.   Reached out to referral coordinator for assistance.

## 2022-10-13 NOTE — Telephone Encounter (Signed)
Patient called and stated that she hasn't heard anything back about an referral for an eye exam.

## 2022-10-13 NOTE — Telephone Encounter (Signed)
Spoke with patient and advised referral has been sent to [facility] again. She reports they likely have her maiden name in their system. Advised her to call them and let them know so they can maybe merge the charts when they see her again. Patient voiced understanding. Nothing further needed at this time.

## 2022-10-14 ENCOUNTER — Ambulatory Visit (INDEPENDENT_AMBULATORY_CARE_PROVIDER_SITE_OTHER): Payer: 59 | Admitting: Nurse Practitioner

## 2022-10-14 ENCOUNTER — Other Ambulatory Visit (HOSPITAL_COMMUNITY)
Admission: RE | Admit: 2022-10-14 | Discharge: 2022-10-14 | Disposition: A | Discharge status: Home / Self Care | Payer: 59 | Source: Ambulatory Visit | Attending: Nurse Practitioner | Admitting: Nurse Practitioner

## 2022-10-14 VITALS — BP 108/62 | HR 73 | Ht 65.0 in | Wt 132.0 lb

## 2022-10-14 DIAGNOSIS — R300 Dysuria: Secondary | ICD-10-CM | POA: Diagnosis not present

## 2022-10-14 DIAGNOSIS — Z124 Encounter for screening for malignant neoplasm of cervix: Secondary | ICD-10-CM | POA: Diagnosis present

## 2022-10-14 DIAGNOSIS — N898 Other specified noninflammatory disorders of vagina: Secondary | ICD-10-CM

## 2022-10-14 LAB — POC URINALSYSI DIPSTICK (AUTOMATED)
Bilirubin, UA: NEGATIVE
Blood, UA: NEGATIVE
Glucose, UA: NEGATIVE
Ketones, UA: NEGATIVE
Leukocytes, UA: NEGATIVE
Nitrite, UA: NEGATIVE
Protein, UA: POSITIVE — AB
Spec Grav, UA: 1.02 (ref 1.010–1.025)
Urobilinogen, UA: 0.2 E.U./dL
pH, UA: 6 (ref 5.0–8.0)

## 2022-10-14 NOTE — Progress Notes (Signed)
Acute Office Visit  Subjective:     Patient ID: Taylor Bautista, female    DOB: January 06, 1991, 31 y.o.   MRN: [id number]  Chief Complaint  Patient presents with   Dysuria    X3w, with burning, white discharge, denies hematuria     Patient is in today for dysuria  States that it has been approx 3 weeks ago States that she is having some soreness when she urinates. States vaginal burning A white discharge that is white and some what thicker States same partner but recently split approx 1 month ago States that she has not tried anything over the counter  Last period was approx 3 weeks ago   Review of Systems  Constitutional:  Negative for chills and fever.  Respiratory:  Negative for shortness of breath.   Cardiovascular:  Negative for chest pain.  Gastrointestinal:  Positive for abdominal pain (intermittent). Negative for nausea and vomiting.  Genitourinary:  Positive for dysuria. Negative for frequency and hematuria.  Musculoskeletal:  Positive for back pain.        Objective:    BP 108/62   Pulse 73   Ht 5\' 5"  (1.651 m)   Wt 132 lb (59.9 kg)   SpO2 98%   BMI 21.97 kg/m    Physical Exam Vitals and nursing note reviewed. Exam conducted with a chaperone present Adela Glimpse, CMA).  Constitutional:      Appearance: Normal appearance.  Cardiovascular:     Rate and Rhythm: Normal rate and regular rhythm.     Heart sounds: Normal heart sounds.  Pulmonary:     Effort: Pulmonary effort is normal.     Breath sounds: Normal breath sounds.  Abdominal:     General: Bowel sounds are normal. There is no distension.     Palpations: There is no mass.     Tenderness: There is abdominal tenderness. There is left CVA tenderness. There is no right CVA tenderness.     Hernia: No hernia is present.    Genitourinary:    Exam position: Lithotomy position.     Vagina: Vaginal discharge present.     Cervix: Normal.     Uterus: Normal.      Adnexa: Right adnexa normal and  left adnexa normal.     Comments: Green/white discharge. Normal consistency.  Neurological:     Mental Status: She is alert.     Results for orders placed or performed in visit on 10/14/22  POCT Urinalysis Dipstick (Automated)  Result Value Ref Range   Color, UA yellow    Clarity, UA clear    Glucose, UA Negative Negative   Bilirubin, UA neg    Ketones, UA neg    Spec Grav, UA 1.020 1.010 - 1.025   Blood, UA neg    pH, UA 6.0 5.0 - 8.0   Protein, UA Positive (A) Negative   Urobilinogen, UA 0.2 0.2 or 1.0 E.U./dL   Nitrite, UA neg    Leukocytes, UA Negative Negative        Assessment & Plan:   Problem List Items Addressed This Visit       Other   Dysuria - Primary    UA negative in office.  Pending vaginal swabs      Relevant Orders   POCT Urinalysis Dipstick (Automated) (Completed)   Vaginal discharge    Pending wet prep and STI panel.      Relevant Orders   WET PREP BY MOLECULAR PROBE   Cytology - PAP([facility])   Screening for cervical cancer    Patient had ASCUS in 2023.  Repeat Pap today      Relevant Orders   Cytology - PAP([facility])    No orders of the defined types were placed in this encounter.   Return in about 6 weeks (around 11/25/2022) for CPE and Labs.  Romilda Garret, NP

## 2022-10-14 NOTE — Patient Instructions (Signed)
Nice to see you today I will be in touch with the labs once I have the result Your urine looked good in office Follow up with me in the next 1-2 months for your physical and labs

## 2022-10-14 NOTE — Assessment & Plan Note (Signed)
Pending wet prep and STI panel.

## 2022-10-14 NOTE — Assessment & Plan Note (Signed)
Patient had ASCUS in 2023.  Repeat Pap today

## 2022-10-14 NOTE — Assessment & Plan Note (Signed)
UA negative in office.  Pending vaginal swabs

## 2022-10-15 LAB — WET PREP BY MOLECULAR PROBE
Candida species: NOT DETECTED
Gardnerella vaginalis: NOT DETECTED
MICRO NUMBER:: 14505738
SPECIMEN QUALITY:: ADEQUATE
Trichomonas vaginosis: NOT DETECTED

## 2022-10-18 ENCOUNTER — Other Ambulatory Visit: Payer: Self-pay | Admitting: Nurse Practitioner

## 2022-10-19 ENCOUNTER — Other Ambulatory Visit: Payer: Self-pay | Admitting: Nurse Practitioner

## 2022-10-19 DIAGNOSIS — E538 Deficiency of other specified B group vitamins: Secondary | ICD-10-CM

## 2022-10-19 NOTE — Telephone Encounter (Signed)
Scheduled lab appt 10/20/22

## 2022-10-20 ENCOUNTER — Other Ambulatory Visit (INDEPENDENT_AMBULATORY_CARE_PROVIDER_SITE_OTHER): Payer: 59

## 2022-10-20 DIAGNOSIS — E538 Deficiency of other specified B group vitamins: Secondary | ICD-10-CM

## 2022-10-20 LAB — CYTOLOGY - PAP
Chlamydia: NEGATIVE
Comment: NEGATIVE
Comment: NEGATIVE
Comment: NEGATIVE
Comment: NORMAL
Diagnosis: NEGATIVE
Diagnosis: REACTIVE
High risk HPV: POSITIVE — AB
Neisseria Gonorrhea: NEGATIVE
Trichomonas: NEGATIVE

## 2022-10-21 LAB — VITAMIN B12: Vitamin B-12: 236 pg/mL (ref 211–911)

## 2022-10-22 MED ORDER — CYANOCOBALAMIN 1000 MCG/ML IJ SOLN: INTRAMUSCULAR | 0 refills | Status: DC

## 2022-10-25 ENCOUNTER — Ambulatory Visit (INDEPENDENT_AMBULATORY_CARE_PROVIDER_SITE_OTHER): Payer: 59 | Admitting: Family

## 2022-10-25 ENCOUNTER — Encounter: Payer: Self-pay | Admitting: Family

## 2022-10-25 VITALS — BP 118/70 | HR 93 | Temp 98.4°F | Ht 65.0 in | Wt 129.2 lb

## 2022-10-25 DIAGNOSIS — F419 Anxiety disorder, unspecified: Secondary | ICD-10-CM | POA: Diagnosis not present

## 2022-10-25 DIAGNOSIS — G43019 Migraine without aura, intractable, without status migrainosus: Secondary | ICD-10-CM

## 2022-10-25 DIAGNOSIS — R5383 Other fatigue: Secondary | ICD-10-CM

## 2022-10-25 MED ORDER — PROPRANOLOL HCL 10 MG PO TABS: 10.0000 mg | ORAL_TABLET | Freq: Two times a day (BID) | ORAL | 1 refills | Status: DC

## 2022-10-25 NOTE — Patient Instructions (Addendum)
Start magnesium citrate 494m daily.   Often as a daily preventative, I recommend taking magnesium citrate with riboflavin 4072mdaily and CoQ10 10068mhree times daily. You may find these supplements over the counter.   We will start propranolol 10 mg twice daily.  As discussed this medication is an old blood pressure medication; please be very vigilant as it relates to dizziness and fatigue.  After a week of propranolol 10 mg twice daily if ineffective, may increase to propranolol 20 mg twice daily.   Please let me know how you are doing.

## 2022-10-25 NOTE — Assessment & Plan Note (Addendum)
Patient experiencing 12 headache days per month.  We agreed to trial propranolol 10 mg twice daily and titrate to propranolol 20 mg twice daily if needed.  Patient will also start magnesium citrate  473m QD for headache prevention.  She will let me know how she is doing

## 2022-10-25 NOTE — Progress Notes (Signed)
Assessment & Plan:  Intractable migraine without aura and without status migrainosus Assessment & Plan: Patient experiencing 12 headache days per month.  We agreed to trial propranolol 10 mg twice daily and titrate to propranolol 20 mg twice daily if needed.  Patient will also start magnesium citrate  467m QD for headache prevention.  She will let me know how she is doing  Orders: -     Propranolol HCl; Take 1 tablet (10 mg total) by mouth 2 (two) times daily.  Dispense: 180 tablet; Refill: 1  Other fatigue  Anxiety -     Propranolol HCl; Take 1 tablet (10 mg total) by mouth 2 (two) times daily.  Dispense: 180 tablet; Refill: 1     Return precautions given.   Risks, benefits, and alternatives of the medications and treatment plan prescribed today were discussed, and patient expressed understanding.   Education regarding symptom management and diagnosis given to patient on AVS either electronically or printed.  No follow-ups on file.  Taylor Paris FNP  Subjective:    Patient ID: [name] female    DOB: [birth year] 32y.o.   MRN: [id number] CC: Taylor Bautista is a 32y.o. female who presents today for follow up.   HPI: Complains of HA 3 days last week HA are across forehead which is the most typical location for her.  She takes 8037mibuprofen without relief.  12 HA days per month.  HA are similar to HA in the past.  She doesn't drink caffeine. HA are not related to menses.   No vision loss.   Wearing new glasses as of 3 days ago.   Endorses anxiety.  Denies SI       Follow-up headache, left ear pain  Ear pain has resolved.    MRI brain with IAC obtained 09/30/22 without acute hemorrhage, infarct.  No mass.  Cochleae and semicircular canals are normal.  Allergies: Patient has no known allergies. Current Outpatient Medications on File Prior to Visit  Medication Sig Dispense Refill   cyanocobalamin (VITAMIN B12) 1000 MCG/ML injection INJECT 1  ML (1,000 MCG TOTAL) INTO THE MUSCLE EVERY 30 DAYS. 3 mL 0   ibuprofen (ADVIL) 800 MG tablet Take 1 tablet (800 mg total) by mouth every 8 (eight) hours as needed. 30 tablet 2   mometasone (ELOCON) 0.1 % cream Apply 1 Application topically daily. Appear pea sized ( or less) amount to external ear, and slightly in canal. 45 g 0   tiZANidine (ZANAFLEX) 4 MG tablet Take 1 tablet (4 mg total) by mouth at bedtime as needed for muscle spasms. 30 tablet 1   diclofenac (VOLTAREN) 75 MG EC tablet Take 1 tablet (75 mg total) by mouth 2 (two) times daily. (Patient not taking: Reported on 10/25/2022) 60 tablet 1   No current facility-administered medications on file prior to visit.    Review of Systems  Constitutional:  Negative for chills and fever.  Respiratory:  Negative for cough.   Cardiovascular:  Negative for chest pain and palpitations.  Gastrointestinal:  Negative for nausea and vomiting.  Neurological:  Positive for headaches.  Psychiatric/Behavioral:  Negative for suicidal ideas. The patient is nervous/anxious.       Objective:    BP 118/70   Pulse 93   Temp 98.4 F (36.9 C) (Oral)   Ht 5' 5"$  (1.651 m)   Wt 129 lb 3.2 oz (58.6 kg)   LMP  (LMP Unknown)   SpO2 99%   BMI  21.50 kg/m  BP Readings from Last 3 Encounters:  10/25/22 118/70  10/14/22 108/62  09/20/22 100/62   Wt Readings from Last 3 Encounters:  10/25/22 129 lb 3.2 oz (58.6 kg)  10/14/22 132 lb (59.9 kg)  09/20/22 132 lb 6.4 oz (60.1 kg)      10/25/2022    9:43 AM 10/14/2022   11:27 AM 09/20/2022   11:20 AM  Depression screen PHQ 2/9  Decreased Interest 1 0 2  Down, Depressed, Hopeless 0 0 2  PHQ - 2 Score 1 0 4  Altered sleeping 1 0 1  Tired, decreased energy 2 0 2  Change in appetite 1 0 2  Feeling bad or failure about yourself  0 0 0  Trouble concentrating 0 0 1  Moving slowly or fidgety/restless 0 0 0  Suicidal thoughts 0 0 0  PHQ-9 Score 5 0 10  Difficult doing work/chores Not difficult at all Not  difficult at all Somewhat difficult    Physical Exam Vitals reviewed.  Constitutional:      Appearance: She is well-developed.  Eyes:     Conjunctiva/sclera: Conjunctivae normal.  Cardiovascular:     Rate and Rhythm: Normal rate and regular rhythm.     Pulses: Normal pulses.     Heart sounds: Normal heart sounds.  Pulmonary:     Effort: Pulmonary effort is normal.     Breath sounds: Normal breath sounds. No wheezing, rhonchi or rales.  Skin:    General: Skin is warm and dry.  Neurological:     Mental Status: She is alert.  Psychiatric:        Speech: Speech normal.        Behavior: Behavior normal.        Thought Content: Thought content normal.

## 2022-11-01 ENCOUNTER — Encounter: Payer: Self-pay | Admitting: Obstetrics and Gynecology

## 2022-11-01 ENCOUNTER — Ambulatory Visit (INDEPENDENT_AMBULATORY_CARE_PROVIDER_SITE_OTHER): Payer: 59 | Admitting: Obstetrics and Gynecology

## 2022-11-01 ENCOUNTER — Telehealth: Payer: Self-pay

## 2022-11-01 VITALS — BP 110/79 | HR 80 | Wt 134.0 lb

## 2022-11-01 DIAGNOSIS — R87619 Unspecified abnormal cytological findings in specimens from cervix uteri: Secondary | ICD-10-CM

## 2022-11-01 NOTE — Progress Notes (Unsigned)
Patient here to discuss pap results   States she was never explained or called about results.

## 2022-11-01 NOTE — Telephone Encounter (Signed)
TC to pt regarding only repeating pap in 1 yr and no need for Colpo today.   Pt still wants to keep appt to discuss results for understanding and clarity.

## 2022-11-02 DIAGNOSIS — R87619 Unspecified abnormal cytological findings in specimens from cervix uteri: Secondary | ICD-10-CM | POA: Insufficient documentation

## 2022-11-02 NOTE — Progress Notes (Signed)
Obstetrics and Gynecology Visit Return Patient Evaluation  Appointment Date: 11/01/2022  Primary Care Provider: [facility]: Center for [facility]   Chief Complaint: abnormal pap smear  History of Present Illness:  Taylor Bautista is a 32 y.o. P2 with above CC. Patient referred by PCP for consideration for colpo. Patient's pap history is below. Message sent to patient that she can keep appt to go over pap smear but she didn't need a colpo and can just repeat a pap smear and hpv in one year (early 2025) 10/2022 pap: neg cyto, +HPV (no subtyping done) 11/2021 pap: ASCUS/hpv negative 09/2019 pap: negative cyto and hpv 06/2016 pap: negative cyto  Interval History: patient has no GYN concerns today. She states she's had the same partner for the past 3 years.   Review of Systems:as noted in the History of Present Illness.  Medications:  Jody R. Bixler had no medications administered during this visit. Current Outpatient Medications  Medication Sig Dispense Refill   cyanocobalamin (VITAMIN B12) 1000 MCG/ML injection INJECT 1 ML (1,000 MCG TOTAL) INTO THE MUSCLE EVERY 30 DAYS. 3 mL 0   propranolol (INDERAL) 10 MG tablet Take 1 tablet (10 mg total) by mouth 2 (two) times daily. 180 tablet 1   diclofenac (VOLTAREN) 75 MG EC tablet Take 1 tablet (75 mg total) by mouth 2 (two) times daily. (Patient not taking: Reported on 10/25/2022) 60 tablet 1   ibuprofen (ADVIL) 800 MG tablet Take 1 tablet (800 mg total) by mouth every 8 (eight) hours as needed. (Patient not taking: Reported on 11/01/2022) 30 tablet 2   mometasone (ELOCON) 0.1 % cream Apply 1 Application topically daily. Appear pea sized ( or less) amount to external ear, and slightly in canal. (Patient not taking: Reported on 11/01/2022) 45 g 0   tiZANidine (ZANAFLEX) 4 MG tablet Take 1 tablet (4 mg total) by mouth at bedtime as needed for muscle spasms. (Patient not taking: Reported on 11/01/2022) 30 tablet  1   No current facility-administered medications for this visit.    Allergies: has No Known Allergies.  Physical Exam:  BP 110/79   Pulse 80   Wt 134 lb (60.8 kg)   LMP 10/04/2022 (Approximate)   BMI 22.30 kg/m  Body mass index is 22.3 kg/m. General appearance: Well nourished, well developed female in no acute distress.  Neuro/Psych:  Normal mood and affect.    Assessment: patient  Plan:  1. Abnormal cervical Papanicolaou smear, unspecified abnormal pap finding Pap history reviewed and d/w her that ASCCP guidelines recommend repeat pap and hpv testing, preferably with subtyping, in one year.   Patient amenable to repeat pap smear and hpv testing in one year   RTC: PRN  Durene Romans MD Attending Center for [facility])

## 2022-12-22 ENCOUNTER — Ambulatory Visit (INDEPENDENT_AMBULATORY_CARE_PROVIDER_SITE_OTHER): Payer: 59

## 2022-12-22 ENCOUNTER — Other Ambulatory Visit (HOSPITAL_COMMUNITY)
Admission: RE | Admit: 2022-12-22 | Discharge: 2022-12-22 | Disposition: A | Discharge status: Home / Self Care | Payer: 59 | Source: Ambulatory Visit | Attending: Obstetrics & Gynecology | Admitting: Obstetrics & Gynecology

## 2022-12-22 VITALS — BP 109/68 | HR 75 | Wt 135.0 lb

## 2022-12-22 DIAGNOSIS — N898 Other specified noninflammatory disorders of vagina: Secondary | ICD-10-CM

## 2022-12-22 NOTE — Progress Notes (Signed)
SUBJECTIVE:  32 y.o. female complains of brown/itching vaginal discharge for 3 day(s). Denies abnormal vaginal bleeding or significant pelvic pain or fever. No UTI symptoms. Denies history of known exposure to STD.  No LMP recorded.  OBJECTIVE:  She appears well, afebrile. Urine dipstick: not done.  ASSESSMENT:  Vaginal Discharge     PLAN:  GC, chlamydia, trichomonas, BVAG, CVAG probe sent to lab. Treatment: To be determined once lab results are received ROV prn if symptoms persist or worsen.

## 2022-12-23 ENCOUNTER — Other Ambulatory Visit: Payer: Self-pay | Admitting: Family Medicine

## 2022-12-23 LAB — CERVICOVAGINAL ANCILLARY ONLY
Bacterial Vaginitis (gardnerella): NEGATIVE
Candida Glabrata: NEGATIVE
Candida Vaginitis: POSITIVE — AB
Chlamydia: NEGATIVE
Comment: NEGATIVE
Comment: NEGATIVE
Comment: NEGATIVE
Comment: NEGATIVE
Comment: NEGATIVE
Comment: NORMAL
Neisseria Gonorrhea: NEGATIVE
Trichomonas: NEGATIVE

## 2022-12-23 MED ORDER — FLUCONAZOLE 150 MG PO TABS: 150.0000 mg | ORAL_TABLET | Freq: Every day | ORAL | 2 refills | Status: DC

## 2023-01-26 ENCOUNTER — Other Ambulatory Visit: Payer: Self-pay | Admitting: Nurse Practitioner

## 2023-01-26 ENCOUNTER — Ambulatory Visit (INDEPENDENT_AMBULATORY_CARE_PROVIDER_SITE_OTHER): Payer: 59 | Admitting: Family Medicine

## 2023-01-26 ENCOUNTER — Encounter: Payer: Self-pay | Admitting: Family Medicine

## 2023-01-26 ENCOUNTER — Telehealth: Payer: Self-pay | Admitting: *Deleted

## 2023-01-26 VITALS — BP 94/60 | HR 57 | Temp 97.8°F | Ht 65.0 in | Wt 136.4 lb

## 2023-01-26 DIAGNOSIS — J019 Acute sinusitis, unspecified: Secondary | ICD-10-CM | POA: Insufficient documentation

## 2023-01-26 DIAGNOSIS — J0100 Acute maxillary sinusitis, unspecified: Secondary | ICD-10-CM | POA: Diagnosis not present

## 2023-01-26 MED ORDER — AMOXICILLIN-POT CLAVULANATE 875-125 MG PO TABS: 1.0000 | ORAL_TABLET | Freq: Two times a day (BID) | ORAL | 0 refills | Status: DC

## 2023-01-26 MED ORDER — CYANOCOBALAMIN 1000 MCG/ML IJ SOLN: INTRAMUSCULAR | 1 refills | Status: DC

## 2023-01-26 NOTE — Assessment & Plan Note (Signed)
Over 2 weeks into viral uri  Got better then worse Congestion/sinus pressure and purulent nasal mucous Treat with augmentin  Symptom care-see AVS Does not tolerate nasal spray  Note for work today  Handout given Update if not starting to improve in a week or if worsening

## 2023-01-26 NOTE — Patient Instructions (Signed)
Take the augmentin as directed  Drink lots of fluids Breathe steam for congestion  Warm compress on face helps alto   Update if not starting to improve in a week or if worsening

## 2023-01-26 NOTE — Progress Notes (Signed)
Subjective:    Patient ID: Taylor Bautista, female    DOB: 16-May-1991, 32 y.o.   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  Former smoker Quit in [employer] Readings from Last 3 Encounters:  01/26/23 136 lb 6 oz (61.9 kg)  12/22/22 135 lb (61.2 kg)  11/01/22 134 lb (60.8 kg)   22.69 kg/m  Vitals:   01/26/23 0900  BP: 94/60  Pulse: (!) 57  Temp: 97.8 F (36.6 C)  SpO2: 98%    Over 2 weeks of symptoms  Started as a cold   Runny nose  Congestion - mucous is green Facial pressure but not pain  Teeth do not hurt  Sore throat  L ear- pressure and some pain   Cough - is mostly dry  No wheezing  No sob   Thinks she had fever in the first few days   Patient Active Problem List   Diagnosis Date Noted   Acute sinusitis 01/26/2023   Abnormal cervical Papanicolaou smear 11/02/2022   Itching of ear 09/20/2022   Right wrist pain 12/23/2021   Depression, major, single episode, severe (HCC) 11/17/2021   Family history of breast cancer in sister 07/06/2021   Abdominal cramping 05/05/2021   Daytime sleepiness 04/27/2021   High risk heterosexual behavior 04/08/2021   Onychomycosis 02/24/2021   Vitamin B 12 deficiency 12/20/2019   Pelvic pain 10/31/2019   Urinary frequency 10/21/2016   Dysuria 10/14/2016   Episodic paroxysmal anxiety disorder 12/26/2014   Anxiety 07/20/2013   Migraine without aura 12/05/2012   Past Medical History:  Diagnosis Date   Allergy    Anxiety    no meds   Dysmenorrhea    Dysthymic disorder    Exposure to STD    MRSA infection    Sleep apnea    pt states she does not use her CPAP    Tobacco use disorder    Past Surgical History:  Procedure Laterality Date   FOOT SURGERY  2016   LAPAROSCOPIC TUBAL LIGATION Bilateral 08/02/2018   Procedure: LAPAROSCOPIC TUBAL LIGATION - Filshie Clips;  Surgeon: Gail Bing, MD;  Location: [facility];  Service: Gynecology;  Laterality: Bilateral;    Pigmented nevus removal  04/1989   Social History   Tobacco Use   Smoking status: Former    Packs/day: 0.50    Years: 10.00    Additional pack years: 0.00    Total pack years: 5.00    Types: Cigarettes    Quit date: 09/27/2018    Years since quitting: 4.3   Smokeless tobacco: Never  Vaping Use   Vaping Use: Never used  Substance Use Topics   Alcohol use: Yes    Comment: very rare   Drug use: No    Comment: MJ occasionally; 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 60 tablet 1   ibuprofen (ADVIL) 800 MG tablet Take 1 tablet (800 mg total) by mouth every 8 (eight) hours as needed. 30 tablet 2   mometasone (ELOCON) 0.1 % cream Apply 1 Application topically daily. Appear pea sized ( or less) amount to external ear, and slightly in canal. 45 g 0   propranolol (INDERAL) 10 MG tablet Take 1 tablet (10 mg total) by mouth 2 (two) times daily. 180 tablet 1   tiZANidine (ZANAFLEX) 4 MG tablet Take 1 tablet (4 mg total) by mouth at bedtime as needed for muscle spasms. 30 tablet 1   No current facility-administered medications on file prior to visit.    Review of Systems  Constitutional:  Positive for appetite change. Negative for fatigue and fever.  HENT:  Positive for congestion, ear pain, postnasal drip, rhinorrhea, sinus pressure and sore throat. Negative for nosebleeds.   Eyes:  Negative for pain, redness and itching.  Respiratory:  Positive for cough. Negative for  shortness of breath and wheezing.   Cardiovascular:  Negative for chest pain.  Gastrointestinal:  Negative for abdominal pain, diarrhea, nausea and vomiting.  Endocrine: Negative for polyuria.  Genitourinary:  Negative for dysuria, frequency and urgency.  Musculoskeletal:  Negative for arthralgias and myalgias.  Allergic/Immunologic: Negative for immunocompromised state.  Neurological:  Positive for headaches. Negative for dizziness, tremors, syncope, weakness and numbness.  Hematological:  Negative for adenopathy. Does not bruise/bleed easily.  Psychiatric/Behavioral:  Negative for dysphoric mood. The patient is not nervous/anxious.        Objective:   Physical Exam Constitutional:      General: She is not in acute distress.    Appearance: Normal appearance. She is well-developed. She is not ill-appearing.  HENT:     Head: Normocephalic and atraumatic.     Comments: Mild maxillary sinus tenderness Also notes that pressure on area feels good     Right Ear: Tympanic membrane and external ear normal.     Left Ear: Tympanic membrane and external ear normal.     Nose: Congestion and rhinorrhea present.     Mouth/Throat:     Pharynx: Oropharynx is clear. No oropharyngeal exudate or posterior oropharyngeal erythema.     Comments: Clear pnd Eyes:     General:        Right eye: No discharge.        Left eye: No discharge.     Conjunctiva/sclera: Conjunctivae normal.     Pupils: Pupils are equal, round, and reactive to light.  Cardiovascular:     Rate and Rhythm: Normal rate and regular rhythm.  Pulmonary:     Effort: Pulmonary effort is normal. No respiratory distress.     Breath sounds: Normal breath sounds. No wheezing or rales.     Comments: Good air exch No rales or rhonchi Musculoskeletal:     Cervical back: Normal range of motion and neck supple.  Lymphadenopathy:     Cervical: No cervical adenopathy.  Skin:    General: Skin is warm and dry.     Findings: No rash.   Neurological:     Mental Status: She is alert.     Cranial Nerves: No cranial nerve deficit.     Coordination: Coordination normal.  Psychiatric:        Mood and Affect: Mood normal.           Assessment & Plan:  Problem List Items Addressed This Visit       Respiratory   Acute sinusitis - Primary    Over 2 weeks into viral uri  Got better then worse Congestion/sinus pressure and purulent nasal mucous Treat with augmentin  Symptom care-see AVS Does not tolerate nasal spray  Note for work today  Handout given Update if not starting to improve in a week or if worsening        Relevant Medications   amoxicillin-clavulanate (AUGMENTIN) 875-125 MG tablet

## 2023-01-26 NOTE — Telephone Encounter (Signed)
Pt was seen today by Dr. Milinda Antis for an acute appt. She asked that a send a refill request for her B12 inj to her PCP.  [facility]

## 2023-02-17 ENCOUNTER — Other Ambulatory Visit (HOSPITAL_COMMUNITY)
Admission: RE | Admit: 2023-02-17 | Discharge: 2023-02-17 | Disposition: A | Discharge status: Home / Self Care | Payer: 59 | Source: Ambulatory Visit | Attending: Family Medicine | Admitting: Family Medicine

## 2023-02-17 ENCOUNTER — Ambulatory Visit (INDEPENDENT_AMBULATORY_CARE_PROVIDER_SITE_OTHER): Payer: 59

## 2023-02-17 VITALS — Wt 141.0 lb

## 2023-02-17 DIAGNOSIS — N898 Other specified noninflammatory disorders of vagina: Secondary | ICD-10-CM | POA: Diagnosis present

## 2023-02-17 NOTE — Progress Notes (Signed)
SUBJECTIVE:  32 y.o. female complains of  vaginal discharge. Denies abnormal vaginal bleeding or significant pelvic pain or fever. No UTI symptoms. Denies history of known exposure to STD.  Patient's last menstrual period was 01/19/2023.  OBJECTIVE:  She appears well, afebrile. Urine dipstick: not done.  ASSESSMENT:  Vaginal Discharge/Irritation     PLAN:  GC, chlamydia, trichomonas, BVAG, CVAG probe sent to lab. Treatment: To be determined once lab results are received ROV prn if symptoms persist or worsen.

## 2023-02-21 LAB — CERVICOVAGINAL ANCILLARY ONLY
Bacterial Vaginitis (gardnerella): POSITIVE — AB
Candida Glabrata: NEGATIVE
Candida Vaginitis: POSITIVE — AB
Chlamydia: NEGATIVE
Comment: NEGATIVE
Comment: NEGATIVE
Comment: NEGATIVE
Comment: NEGATIVE
Comment: NEGATIVE
Comment: NORMAL
Neisseria Gonorrhea: NEGATIVE
Trichomonas: NEGATIVE

## 2023-02-22 ENCOUNTER — Other Ambulatory Visit: Payer: Self-pay | Admitting: *Deleted

## 2023-02-22 MED ORDER — FLUCONAZOLE 150 MG PO TABS: 150.0000 mg | ORAL_TABLET | Freq: Once | ORAL | 3 refills | Status: AC

## 2023-02-22 MED ORDER — METRONIDAZOLE 500 MG PO TABS: 500.0000 mg | ORAL_TABLET | Freq: Two times a day (BID) | ORAL | 0 refills | Status: AC

## 2023-03-21 ENCOUNTER — Other Ambulatory Visit (HOSPITAL_COMMUNITY)
Admission: RE | Admit: 2023-03-21 | Discharge: 2023-03-21 | Disposition: A | Discharge status: Home / Self Care | Payer: 59 | Source: Ambulatory Visit | Attending: Obstetrics & Gynecology | Admitting: Obstetrics & Gynecology

## 2023-03-21 ENCOUNTER — Ambulatory Visit (INDEPENDENT_AMBULATORY_CARE_PROVIDER_SITE_OTHER): Payer: 59

## 2023-03-21 VITALS — BP 103/70 | HR 73 | Wt 137.0 lb

## 2023-03-21 DIAGNOSIS — N898 Other specified noninflammatory disorders of vagina: Secondary | ICD-10-CM | POA: Insufficient documentation

## 2023-03-21 DIAGNOSIS — R300 Dysuria: Secondary | ICD-10-CM

## 2023-03-21 MED ORDER — METRONIDAZOLE 500 MG PO TABS: 500.0000 mg | ORAL_TABLET | Freq: Two times a day (BID) | ORAL | 0 refills | Status: DC

## 2023-03-21 MED ORDER — FLUCONAZOLE 150 MG PO TABS: 150.0000 mg | ORAL_TABLET | Freq: Once | ORAL | 1 refills | Status: AC

## 2023-03-21 NOTE — Progress Notes (Signed)
SUBJECTIVE:  32 y.o. female complains of  vaginal discharge and dysuria Denies abnormal vaginal bleeding or significant pelvic pain or fever.Denies history of known exposure to STD.  No LMP recorded.  OBJECTIVE:  She appears alert, well appearing, in no apparent distress Urine dipstick: not done.  ASSESSMENT:  Vaginal Discharge   Dysuria    PLAN:  GC, chlamydia, trichomonas, BVAG, CVAG probe and urine culture sent to lab. Treatment: To be determined once lab results are received ROV prn if symptoms persist or worsen.

## 2023-03-22 LAB — CERVICOVAGINAL ANCILLARY ONLY
Bacterial Vaginitis (gardnerella): NEGATIVE
Candida Glabrata: NEGATIVE
Candida Vaginitis: NEGATIVE
Chlamydia: NEGATIVE
Comment: NEGATIVE
Comment: NEGATIVE
Comment: NEGATIVE
Comment: NEGATIVE
Comment: NEGATIVE
Comment: NORMAL
Neisseria Gonorrhea: NEGATIVE
Trichomonas: NEGATIVE

## 2023-03-23 LAB — URINE CULTURE

## 2023-04-12 ENCOUNTER — Encounter (HOSPITAL_COMMUNITY): Payer: Self-pay | Admitting: *Deleted

## 2023-04-12 ENCOUNTER — Ambulatory Visit (HOSPITAL_COMMUNITY)
Admission: EM | Admit: 2023-04-12 | Discharge: 2023-04-12 | Disposition: A | Discharge status: Home / Self Care | Payer: 59 | Attending: Internal Medicine | Admitting: Internal Medicine

## 2023-04-12 DIAGNOSIS — R21 Rash and other nonspecific skin eruption: Secondary | ICD-10-CM | POA: Diagnosis not present

## 2023-04-12 MED ORDER — TRIAMCINOLONE ACETONIDE 0.1 % EX CREA: 1.0000 | TOPICAL_CREAM | Freq: Two times a day (BID) | CUTANEOUS | 0 refills | Status: DC

## 2023-04-12 NOTE — ED Provider Notes (Signed)
[facility]    CSN: [id number] Arrival date & time: 04/12/23  1646      History   Chief Complaint Chief Complaint  Patient presents with   Rash    HPI Taylor Bautista is a 32 y.o. female.   Patient presents to urgent care for evaluation of lesion to the left calf that she first noticed approximately 1 week ago.  She wears pants for work frequently and states lesion has become very irritated/itchy.  No recent trauma or injury to the area.  No recent changes in laundry detergents or soaps, no known exposures to other contacts with similar rash.  She does not recall any insect bites to the area.  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  Denies redness to the site, drainage, warmth, fever/chills, pain to the lesion, and recent antibiotic/steroid use.  Has not attempted use of any over-the-counter medications prior to arrival.     Past Medical History:  Diagnosis Date   Allergy    Anxiety    no meds   Dysmenorrhea    Dysthymic disorder    Exposure to STD    MRSA infection    Sleep apnea    pt states she does not use her CPAP    Tobacco use disorder     Patient Active Problem List   Diagnosis Date Noted   Acute sinusitis 01/26/2023   Abnormal cervical Papanicolaou smear 11/02/2022   Itching of ear 09/20/2022   Right wrist pain 12/23/2021   Depression, major, single episode, severe (HCC) 11/17/2021   Family history of breast cancer in sister 07/06/2021   Abdominal cramping 05/05/2021   Daytime sleepiness 04/27/2021   High risk heterosexual behavior 04/08/2021   Onychomycosis 02/24/2021   Vitamin B 12 deficiency 12/20/2019   Pelvic pain 10/31/2019   Urinary frequency 10/21/2016   Dysuria 10/14/2016   Episodic paroxysmal anxiety disorder 12/26/2014   Anxiety 07/20/2013   Migraine without aura 12/05/2012    Past Surgical History:  Procedure Laterality Date   FOOT SURGERY  2016   LAPAROSCOPIC TUBAL LIGATION Bilateral 08/02/2018    Procedure: LAPAROSCOPIC TUBAL LIGATION - Filshie Clips;  Surgeon: Woodward Bing, MD;  Location: [facility];  Service: Gynecology;  Laterality: Bilateral;   Pigmented nevus removal  04/1989    OB History     Gravida  3   Para  2   Term  1   Preterm  1   AB  1   Living  2      SAB  0   IAB  1   Ectopic  0   Multiple  0   Live Births  2            Home Medications    Prior to Admission medications   Medication Sig Start Date End Date Taking? Authorizing Provider  cyanocobalamin (VITAMIN B12) 1000 MCG/ML injection INJECT 1 ML (1,000 MCG TOTAL) INTO THE MUSCLE EVERY 30 DAYS. 01/26/23  Yes Eden Emms, NP  triamcinolone cream (KENALOG) 0.1 % Apply 1 Application topically 2 (two) times daily. 04/12/23  Yes Carlisle Beers, FNP  amoxicillin-clavulanate (AUGMENTIN) 875-125 MG tablet Take 1 tablet by mouth 2 (two) times daily. 01/26/23   Tower, Audrie Gallus, MD  diclofenac (VOLTAREN) 75 MG EC tablet Take 1 tablet (75 mg total) by mouth 2 (two) times daily. 07/19/22   Copland, Karleen Hampshire, MD  ibuprofen (ADVIL) 800 MG tablet Take 1 tablet (800 mg total)  by mouth every 8 (eight) hours as needed. 06/30/20   Lorre Munroe, NP  metroNIDAZOLE (FLAGYL) 500 MG tablet Take 1 tablet (500 mg total) by mouth 2 (two) times daily. 03/21/23   Anyanwu, Jethro Bastos, MD  mometasone (ELOCON) 0.1 % cream Apply 1 Application topically daily. Appear pea sized ( or less) amount to external ear, and slightly in canal. 09/20/22   Arnett, Lyn Records, FNP  propranolol (INDERAL) 10 MG tablet Take 1 tablet (10 mg total) by mouth 2 (two) times daily. 10/25/22   Allegra Grana, FNP  tiZANidine (ZANAFLEX) 4 MG tablet Take 1 tablet (4 mg total) by mouth at bedtime as needed for muscle spasms. 07/19/22   CoplandKarleen Hampshire, MD    Family History Family History  Problem Relation Age of Onset   Breast cancer Mother 50       initial 23, recurrence at 51   Cancer Sister 3       colon cancer    Anxiety disorder Sister    Depression Sister    Cancer Sister 23       lymphnode cancer   Breast cancer Cousin 24       breast   Anxiety disorder Other     Social History Social History   Tobacco Use   Smoking status: Former    Current packs/day: 0.00    Average packs/day: 0.5 packs/day for 10.0 years (5.0 ttl pk-yrs)    Types: Cigarettes    Start date: 09/27/2008    Quit date: 09/27/2018    Years since quitting: 4.5   Smokeless tobacco: Never  Vaping Use   Vaping status: Never Used  Substance Use Topics   Alcohol use: Yes    Comment: very rare   Drug use: Not Currently    Comment: MJ occasionally; 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   Review of Systems Review of Systems Per HPI  Physical Exam Triage Vital Signs ED Triage Vitals  Encounter Vitals Group     BP 04/12/23 1711 126/86     Systolic BP Percentile --      Diastolic BP Percentile --      Pulse Rate 04/12/23 1711 76     Resp 04/12/23 1711 18     Temp 04/12/23 1711 97.9 F (36.6 C)     Temp Source 04/12/23 1711 Oral     SpO2 04/12/23 1711 100 %     Weight --      Height --      Head Circumference --      Peak Flow --      Pain Score 04/12/23 1709 5     Pain Loc --      Pain Education --      Exclude from Growth Chart --    No data found.  Updated Vital Signs BP 126/86 (BP Location: Left Arm)   Pulse 76   Temp 97.9 F (36.6 C) (Oral)   Resp 18   LMP 04/05/2023 (Exact Date)   SpO2 100%   Visual Acuity Right Eye Distance:   Left Eye Distance:   Bilateral Distance:    Right Eye Near:   Left Eye Near:    Bilateral Near:     Physical Exam Vitals and nursing note reviewed.  Constitutional:      Appearance: She is not ill-appearing or toxic-appearing.  HENT:     Head: Normocephalic and atraumatic.  Right Ear: Hearing and external ear normal.     Left Ear: Hearing and external ear normal.     Nose: Nose normal.      Mouth/Throat:     Lips: Pink.  Eyes:     General: Lids are normal. Vision grossly intact. Gaze aligned appropriately.     Extraocular Movements: Extraocular movements intact.     Conjunctiva/sclera: Conjunctivae normal.  Pulmonary:     Effort: Pulmonary effort is normal.  Musculoskeletal:     Cervical back: Neck supple.  Skin:    General: Skin is warm and dry.     Capillary Refill: Capillary refill takes less than 2 seconds.     Findings: Lesion (Raised skin toned lesion present to the left posterior medial calf as seen in image below.  No erythema, warmth, tenderness to palpation, underlying soft tissue swelling, or drainage present.) present. No rash.  Neurological:     General: No focal deficit present.     Mental Status: She is alert and oriented to person, place, and time. Mental status is at baseline.     Cranial Nerves: No dysarthria or facial asymmetry.  Psychiatric:        Mood and Affect: Mood normal.        Speech: Speech normal.        Behavior: Behavior normal.        Thought Content: Thought content normal.        Judgment: Judgment normal.      UC Treatments / Results  Labs (all labs ordered are listed, but only abnormal results are displayed) Labs Reviewed - No data to display  EKG   Radiology No results found.  Procedures Procedures (including critical care time)  Medications Ordered in UC Medications - No data to display  Initial Impression / Assessment and Plan / UC Course  I have reviewed the triage vital signs and the nursing notes.  Pertinent labs & imaging results that were available during my care of the patient were reviewed by me and considered in my medical decision making (see chart for details).   1.  Rash and nonspecific skin eruption Unclear etiology of patient's lesion.  Suspect this is inflammatory in nature.  Will treat with triamcinolone cream twice daily for 7 to 10 days.  Advised to avoid using triamcinolone cream for longer than  10 days as this is a stronger steroid. May apply warm compresses as needed. Infection return precautions discussed.  No signs of secondary bacterial infection currently.  Counseled patient on potential for adverse effects with medications prescribed/recommended today, strict ER and return-to-clinic precautions discussed, patient verbalized understanding.    Final Clinical Impressions(s) / UC Diagnoses   Final diagnoses:  Rash and nonspecific skin eruption     Discharge Instructions      Use triamcinolone cream to the lesion twice daily for the next 7 to 10 days.  Do not use this for longer than 10 days as this is a strong steroid cream.  Otherwise, you may use Aquaphor to the area as well.  Monitor for signs of infection such as redness, swelling, pus coming out of the site, or pain.  If you develop any new or worsening symptoms or if your symptoms do not start to improve, pleases return here or follow-up with your primary care provider. If your symptoms are severe, please go to the emergency room.   ED Prescriptions     Medication Sig Dispense Auth. Provider   triamcinolone cream (KENALOG) 0.1 %  Apply 1 Application topically 2 (two) times daily. 30 g Carlisle Beers, FNP      PDMP not reviewed this encounter.   Carlisle Beers, [place] 04/12/23 1811

## 2023-04-12 NOTE — Discharge Instructions (Addendum)
Use triamcinolone cream to the lesion twice daily for the next 7 to 10 days.  Do not use this for longer than 10 days as this is a strong steroid cream.  Otherwise, you may use Aquaphor to the area as well.  Monitor for signs of infection such as redness, swelling, pus coming out of the site, or pain.  If you develop any new or worsening symptoms or if your symptoms do not start to improve, pleases return here or follow-up with your primary care provider. If your symptoms are severe, please go to the emergency room.

## 2023-04-12 NOTE — ED Triage Notes (Signed)
Pt states she had a spot come up on the back of her left lower leg x 1 week. She states it started as dry skin now has a knot and she is concerned since she has a hx of MRSA.

## 2023-05-25 ENCOUNTER — Telehealth: Payer: Self-pay

## 2023-05-25 ENCOUNTER — Encounter: Payer: Self-pay | Admitting: Family Medicine

## 2023-05-25 ENCOUNTER — Ambulatory Visit (INDEPENDENT_AMBULATORY_CARE_PROVIDER_SITE_OTHER): Payer: 59 | Admitting: Family Medicine

## 2023-05-25 VITALS — BP 112/66 | HR 86 | Temp 98.0°F | Resp 16 | Ht 65.0 in | Wt 144.4 lb

## 2023-05-25 DIAGNOSIS — N898 Other specified noninflammatory disorders of vagina: Secondary | ICD-10-CM | POA: Diagnosis not present

## 2023-05-25 DIAGNOSIS — R350 Frequency of micturition: Secondary | ICD-10-CM

## 2023-05-25 DIAGNOSIS — R1084 Generalized abdominal pain: Secondary | ICD-10-CM | POA: Diagnosis not present

## 2023-05-25 LAB — POC URINALSYSI DIPSTICK (AUTOMATED)
Bilirubin, UA: NEGATIVE
Blood, UA: NEGATIVE
Glucose, UA: NEGATIVE
Ketones, UA: NEGATIVE
Leukocytes, UA: NEGATIVE
Nitrite, UA: NEGATIVE
Protein, UA: NEGATIVE
Spec Grav, UA: 1.015 (ref 1.010–1.025)
Urobilinogen, UA: 0.2 U/dL
pH, UA: 6 (ref 5.0–8.0)

## 2023-05-25 LAB — POCT WET PREP (WET MOUNT)
Clue Cells Wet Prep Whiff POC: POSITIVE
Trichomonas Wet Prep HPF POC: ABSENT

## 2023-05-25 MED ORDER — METRONIDAZOLE 500 MG PO TABS: 500.0000 mg | ORAL_TABLET | Freq: Two times a day (BID) | ORAL | 0 refills | Status: DC

## 2023-05-25 MED ORDER — FLUCONAZOLE 150 MG PO TABS: ORAL_TABLET | ORAL | 0 refills | Status: DC

## 2023-05-25 NOTE — Assessment & Plan Note (Signed)
Neg urinalysis   Clue cells on wet prep today  Will treat for BV and report back

## 2023-05-25 NOTE — Telephone Encounter (Signed)
Pt has appointment with Dr. Milinda Antis today for abdominal pain and vaginal discharge.  Called pt to triage since her appointment is at 4pm with lab closing.  Pt reports that she has been having intermittent pain x 2 days.  Vaginal discharge is clear and does not have an odor.  She reports that the abdominal pain happens when she is walking around and describes it as sharp/stabbing 7/10.  She has had daily BM with no change in color or consistency. Please advise if you'd like labs done prior to appointment.

## 2023-05-25 NOTE — Patient Instructions (Addendum)
Take the generic flagyl twice daily for 7 days for bacterial vaginosis   If you get a yeast infection -take the diflucan as directed    A probiotic or yogurt (orally) may help prevent vaginitis also    If symptoms do not improve or if they worsen- let us know and follow up with gyn   If any severe symptoms go to the ER    Labs are pending

## 2023-05-25 NOTE — Telephone Encounter (Signed)
Thank you  I ordered future labs for abd pain (cbc and lipase and cmet)-please draw when she gets here at 4 Appreciate that!

## 2023-05-25 NOTE — Progress Notes (Signed)
Subjective:    Patient ID: Taylor Bautista, female    DOB: 16-Nov-1990, 32 y.o.   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 BTL presents for vaginal discharge  Also abdominal pain   Vaginal d/c for 3 d A little yellow  No discomfort or itch or burn No odor  Wonders if she may have BV   No change in products Not sexually active right now  No worries about STD    Abd pain for 3 d  This is new  Worse when moving  Sharp in quality  Below umbilicus - both sides equally   Has regular menses No problems LMP 2.5 weeks ago   This does not feel like ovulation pain  Has had a tubal ligation in the past  No history of tubal pregnancy     No constipation  No diarrhea  No fever  No blood in stool  Stool was darker than usual this am /not black   Urinary freq  No dysuria   Wet prep today  Urinalysis today    Results for orders placed or performed in visit on 05/25/23  POCT Urinalysis Dipstick (Automated)  Result Value Ref Range   Color, UA Yellow    Clarity, UA clear    Glucose, UA Negative Negative   Bilirubin, UA Negative    Ketones, UA Negative    Spec Grav, UA 1.015 1.010 - 1.025   Blood, UA Negative    pH, UA 6.0 5.0 - 8.0   Protein, UA Negative Negative   Urobilinogen, UA 0.2 0.2 or 1.0 E.U./dL   Nitrite, UA Negative    Leukocytes, UA Negative Negative  POCT Wet Prep ([place]et [place])  Result Value Ref Range   Source Wet Prep POC vaginal    WBC, Wet Prep HPF POC mod    Bacteria Wet Prep HPF POC Moderate (A) Few   BACTERIA WET PREP MORPHOLOGY POC     Clue Cells Wet Prep HPF POC Moderate (A) None   Clue Cells Wet Prep Whiff POC Positive Whiff    Yeast Wet Prep HPF POC None None   KOH Wet Prep POC None  None   Trichomonas Wet Prep HPF POC Absent Absent     Fam history Mother had breast cancer at 49 Sister had colon cancer at 21 Sister had lymphatic cancer at 55  Last STD screening was negative in July  Per chart/gyn had BV and yeast in June  History of high risk HPV in 10/2022   Patient Active Problem List   Diagnosis Date Noted   Generalized abdominal pain 05/25/2023   Acute sinusitis 01/26/2023   Abnormal cervical Papanicolaou smear 11/02/2022   Itching of ear 09/20/2022   Right wrist pain 12/23/2021   Depression, major, single episode, severe (HCC) 11/17/2021   Family history of breast cancer in sister 07/06/2021   Abdominal cramping 05/05/2021   Daytime sleepiness 04/27/2021   High risk heterosexual behavior 04/08/2021   Onychomycosis 02/24/2021   Vitamin B 12 deficiency 12/20/2019   Pelvic pain 10/31/2019   Urinary frequency 10/21/2016   Vaginal discharge 10/19/2016   Dysuria  10/14/2016   Episodic paroxysmal anxiety disorder 12/26/2014   Anxiety 07/20/2013   Migraine without aura 12/05/2012   Past Medical History:  Diagnosis Date   Allergy    Anxiety    no meds   Dysmenorrhea    Dysthymic disorder    Exposure to STD    MRSA infection    Sleep apnea    pt states she does not use her CPAP    Tobacco use disorder    Past Surgical History:  Procedure Laterality Date   FOOT SURGERY  2016   LAPAROSCOPIC TUBAL LIGATION Bilateral 08/02/2018   Procedure: LAPAROSCOPIC TUBAL LIGATION - Filshie Clips;  Surgeon: [place] Bing, MD;  Location: [facility];  Service: Gynecology;  Laterality: Bilateral;   Pigmented nevus removal  04/1989   Social History   Tobacco Use   Smoking status: Former    Current packs/day: 0.00    Average packs/day: 0.5 packs/day for 10.0 years (5.0 ttl pk-yrs)    Types: Cigarettes    Start date: 09/27/2008    Quit date: 09/27/2018    Years since quitting: 4.6   Smokeless tobacco: Never  Vaping Use   Vaping status: Never  Used  Substance Use Topics   Alcohol use: Yes    Comment: very rare   Drug use: Not Currently    Comment: MJ occasionally; 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 3 mL 1   No current facility-administered medications on file prior to visit.    Review of Systems  Constitutional:  Negative for activity change, appetite change, fatigue, fever and unexpected weight change.  HENT:  Negative for congestion, ear pain, rhinorrhea, sinus pressure and sore throat.   Eyes:  Negative for pain, redness and visual disturbance.  Respiratory:  Negative for cough, shortness of breath and wheezing.   Cardiovascular:  Negative for chest pain and palpitations.  Gastrointestinal:  Negative for abdominal pain, blood in stool, constipation and diarrhea.  Endocrine: Negative for polydipsia and polyuria.  Genitourinary:  Positive for frequency and vaginal discharge. Negative for difficulty urinating, dysuria, flank pain, genital sores, hematuria, urgency and vaginal pain.  Musculoskeletal:  Negative for arthralgias, back pain and myalgias.  Skin:  Negative for pallor and rash.  Allergic/Immunologic: Negative for environmental allergies.  Neurological:  Negative for dizziness, syncope and headaches.  Hematological:  Negative for adenopathy. Does not bruise/bleed easily.  Psychiatric/Behavioral:  Negative for decreased concentration and dysphoric mood. The patient is not  nervous/anxious.        Objective:   Physical Exam Exam conducted with a chaperone present.  Constitutional:      General: She is not in acute distress.    Appearance: She is well-developed and normal weight. She is not ill-appearing or diaphoretic.  HENT:     Head:  Normocephalic and atraumatic.  Eyes:     Conjunctiva/sclera: Conjunctivae normal.     Pupils: Pupils are equal, round, and reactive to light.  Neck:     Thyroid: No thyromegaly.     Vascular: No carotid bruit or JVD.  Cardiovascular:     Rate and Rhythm: Normal rate and regular rhythm.     Heart sounds: Normal heart sounds.     No gallop.  Pulmonary:     Effort: Pulmonary effort is normal. No respiratory distress.     Breath sounds: Normal breath sounds. No wheezing or rales.  Abdominal:     General: There is no distension or abdominal bruit.     Palpations: Abdomen is soft.  Genitourinary:    Exam position: Supine.     Labia:        Right: No rash, tenderness, lesion or injury.        Left: No rash, tenderness, lesion or injury.      Urethra: No prolapse, urethral pain, urethral swelling or urethral lesion.     Comments: Cloudy vag d/c  Wet prep- clue cells and positive whiff  No tenderness  Musculoskeletal:     Cervical back: Normal range of motion and neck supple.     Right lower leg: No edema.     Left lower leg: No edema.  Lymphadenopathy:     Cervical: No cervical adenopathy.  Skin:    General: Skin is warm and dry.     Coloration: Skin is not pale.     Findings: No rash.  Neurological:     Mental Status: She is alert.     Coordination: Coordination normal.     Deep Tendon Reflexes: Reflexes are normal and symmetric. Reflexes normal.  Psychiatric:        Mood and Affect: Mood normal.           Assessment & Plan:   Problem List Items Addressed This Visit       Other   Generalized abdominal pain    3 days  Now more low abdomen /midline  Labs sent  Suspect from BV after exam and  wet prep  Clue cells on wet prep Neg UA        Relevant Orders   Comprehensive metabolic panel   CBC with Differential/Platelet   Lipase   Urinary frequency    Neg urinalysis   Clue cells on wet prep today  Will treat for BV and report back       Relevant Orders   POCT Urinalysis Dipstick (Automated) (Completed)   Vaginal discharge - Primary    Without itchi/burn or odor  Some low abd crampy pain (labs pending)  Wet prep-positive for clue cells and mild positive whiff Treatment with metronidazole bid 7d (Pt notes this almost always gives her yeast infx so sent in diflucan as well to use afterwards prn)  Update if not starting to improve in a week or if worsening  Call back and Er precautions noted in detail today           Relevant Orders   POCT Wet Prep Mellody Drown Alicia) (Completed)

## 2023-05-25 NOTE — Assessment & Plan Note (Addendum)
Without itchi/burn or odor  Some low abd crampy pain (labs pending)  Wet prep-positive for clue cells and mild positive whiff Treatment with metronidazole bid 7d (Pt notes this almost always gives her yeast infx so sent in diflucan as well to use afterwards prn)  Reviewed most recent gyn notes and results/ negative STD screen this summer Declines need for STD screen today  Update if not starting to improve in a week or if worsening  Call back and Er precautions noted in detail today

## 2023-05-25 NOTE — Assessment & Plan Note (Signed)
3 days  Now more low abdomen /midline  Labs sent  Suspect from BV after exam and wet prep  Clue cells on wet prep Neg UA

## 2023-05-26 LAB — COMPREHENSIVE METABOLIC PANEL
ALT: 23 U/L (ref 0–35)
AST: 20 U/L (ref 0–37)
Albumin: 4.1 g/dL (ref 3.5–5.2)
Alkaline Phosphatase: 53 U/L (ref 39–117)
BUN: 10 mg/dL (ref 6–23)
CO2: 26 meq/L (ref 19–32)
Calcium: 9.2 mg/dL (ref 8.4–10.5)
Chloride: 102 meq/L (ref 96–112)
Creatinine, Ser: 0.68 mg/dL (ref 0.40–1.20)
GFR: 113.68 mL/min (ref >60.00)
Glucose, Bld: 86 mg/dL (ref 70–99)
Potassium: 3.8 meq/L (ref 3.5–5.1)
Sodium: 138 meq/L (ref 135–145)
Total Bilirubin: 0.4 mg/dL (ref 0.2–1.2)
Total Protein: 6.2 g/dL (ref 6.0–8.3)

## 2023-05-26 LAB — CBC WITH DIFFERENTIAL/PLATELET
Basophils Absolute: 0 10*3/uL (ref 0.0–0.1)
Basophils Relative: 0.4 % (ref 0.0–3.0)
Eosinophils Absolute: 0.1 10*3/uL (ref 0.0–0.7)
Eosinophils Relative: 1.6 % (ref 0.0–5.0)
HCT: 39.6 % (ref 36.0–46.0)
Hemoglobin: 13.2 g/dL (ref 12.0–15.0)
Lymphocytes Relative: 27.5 % (ref 12.0–46.0)
Lymphs Abs: 1.8 10*3/uL (ref 0.7–4.0)
MCHC: 33.3 g/dL (ref 30.0–36.0)
MCV: 93.3 fl (ref 78.0–100.0)
Monocytes Absolute: 0.6 10*3/uL (ref 0.1–1.0)
Monocytes Relative: 8.4 % (ref 3.0–12.0)
Neutro Abs: 4.1 10*3/uL (ref 1.4–7.7)
Neutrophils Relative %: 62.1 % (ref 43.0–77.0)
Platelets: 273 10*3/uL (ref 150.0–400.0)
RBC: 4.25 Mil/uL (ref 3.87–5.11)
RDW: 12.4 % (ref 11.5–15.5)
WBC: 6.7 10*3/uL (ref 4.0–10.5)

## 2023-05-26 LAB — LIPASE: Lipase: 31 U/L (ref 11.0–59.0)

## 2023-06-29 ENCOUNTER — Ambulatory Visit: Payer: 59 | Admitting: Nurse Practitioner

## 2023-06-29 VITALS — BP 108/74 | HR 78 | Temp 98.6°F | Ht 65.0 in | Wt 146.0 lb

## 2023-06-29 DIAGNOSIS — R1030 Lower abdominal pain, unspecified: Secondary | ICD-10-CM

## 2023-06-29 DIAGNOSIS — N898 Other specified noninflammatory disorders of vagina: Secondary | ICD-10-CM | POA: Diagnosis not present

## 2023-06-29 LAB — POCT URINALYSIS DIPSTICK
Bilirubin, UA: NEGATIVE
Blood, UA: NEGATIVE
Glucose, UA: NEGATIVE
Ketones, UA: NEGATIVE
Leukocytes, UA: NEGATIVE
Nitrite, UA: NEGATIVE
Protein, UA: NEGATIVE
Spec Grav, UA: 1.02 (ref 1.010–1.025)
Urobilinogen, UA: 0.2 U/dL
pH, UA: 6 (ref 5.0–8.0)

## 2023-06-29 NOTE — Assessment & Plan Note (Signed)
UA in office.  Patient recently had blood work done that was all within normal limits.  Recently screened for STIs patient declined that today.  Pending wet prep

## 2023-06-29 NOTE — Progress Notes (Signed)
Acute Office Visit  Subjective:     Patient ID: Taylor Bautista, female    DOB: 23-May-1991, 32 y.o.   MRN: [id number]  Chief Complaint  Patient presents with   Vaginal Discharge    Clear to cloudy liquid discharge. Denies any itching.      Patient is in today for vaginal discharge with a history of the same, pelvic pian, FH breast ca, abnormal pap smear   States that she was seen by Dr. Milinda Antis on 05/25/2023. States that she was treated with flagly and diflucan. Then started her period and was unsure if it resolved   States that she is having irratiion (not itching), watery white discharge.  States that she is having some abdominal pian without nausea and vomiting. No concerns for sti/std   Review of Systems  Constitutional:  Negative for chills and fever.  Respiratory:  Negative for shortness of breath.   Cardiovascular:  Negative for chest pain.  Gastrointestinal:  Positive for abdominal pain. Negative for nausea and vomiting.  Genitourinary:  Positive for vaginal discharge. Negative for dysuria and frequency.        Objective:    BP 108/74   Pulse 78   Temp 98.6 F (37 C) (Oral)   Ht 5\' 5"  (1.651 m)   Wt 146 lb (66.2 kg)   LMP 06/20/2023   SpO2 99%   BMI 24.30 kg/m    Physical Exam Vitals and nursing note reviewed.  Constitutional:      Appearance: Normal appearance.  Cardiovascular:     Rate and Rhythm: Normal rate and regular rhythm.     Heart sounds: Normal heart sounds.  Pulmonary:     Effort: Pulmonary effort is normal.     Breath sounds: Normal breath sounds.  Abdominal:     General: Bowel sounds are normal. There is no distension.     Palpations: There is no mass.     Tenderness: There is abdominal tenderness.     Hernia: No hernia is present.  Neurological:     Mental Status: She is alert.     Results for orders placed or performed in visit on 06/29/23  POCT urinalysis dipstick  Result Value Ref Range   Color, UA Yellow    Clarity, UA  Clear    Glucose, UA Negative Negative   Bilirubin, UA neg    Ketones, UA neg    Spec Grav, UA 1.020 1.010 - 1.025   Blood, UA neg    pH, UA 6.0 5.0 - 8.0   Protein, UA Negative Negative   Urobilinogen, UA 0.2 0.2 or 1.0 E.U./dL   Nitrite, UA neg    Leukocytes, UA Negative Negative   Appearance     Odor          Assessment & Plan:   Problem List Items Addressed This Visit       Other   Vaginal discharge    History of the same recently treated with metronidazole twice daily for 7 days.  Pending wet prep, UA was negative in office.      Relevant Orders   WET PREP BY MOLECULAR PROBE   Lower abdominal pain - Primary    UA in office.  Patient recently had blood work done that was all within normal limits.  Recently screened for STIs patient declined that today.  Pending wet prep      Relevant Orders   POCT urinalysis dipstick (Completed)    No orders of the defined types  were placed in this encounter.   Return in about 4 months (around 10/30/2023).  Audria Nine, NP

## 2023-06-29 NOTE — Patient Instructions (Addendum)
Nice to see you today I will be in touch with the labs once I have the result Follow up with me in 4 months for your physical

## 2023-06-29 NOTE — Assessment & Plan Note (Signed)
History of the same recently treated with metronidazole twice daily for 7 days.  Pending wet prep, UA was negative in office.

## 2023-06-30 LAB — WET PREP BY MOLECULAR PROBE
Candida species: NOT DETECTED
Gardnerella vaginalis: NOT DETECTED
MICRO NUMBER:: 15602744
SPECIMEN QUALITY:: ADEQUATE
Trichomonas vaginosis: NOT DETECTED

## 2023-07-28 ENCOUNTER — Other Ambulatory Visit: Payer: Self-pay | Admitting: Nurse Practitioner

## 2023-09-27 ENCOUNTER — Encounter: Payer: Self-pay | Admitting: Family

## 2023-09-27 ENCOUNTER — Ambulatory Visit (INDEPENDENT_AMBULATORY_CARE_PROVIDER_SITE_OTHER): Payer: 59 | Admitting: Family

## 2023-09-27 ENCOUNTER — Ambulatory Visit: Payer: Self-pay | Admitting: Nurse Practitioner

## 2023-09-27 VITALS — BP 92/52 | HR 95 | Temp 98.3°F | Ht 65.0 in

## 2023-09-27 DIAGNOSIS — R8290 Unspecified abnormal findings in urine: Secondary | ICD-10-CM

## 2023-09-27 DIAGNOSIS — N898 Other specified noninflammatory disorders of vagina: Secondary | ICD-10-CM

## 2023-09-27 DIAGNOSIS — R12 Heartburn: Secondary | ICD-10-CM | POA: Diagnosis not present

## 2023-09-27 DIAGNOSIS — R109 Unspecified abdominal pain: Secondary | ICD-10-CM

## 2023-09-27 DIAGNOSIS — R82998 Other abnormal findings in urine: Secondary | ICD-10-CM

## 2023-09-27 LAB — POCT URINE DIPSTICK
Bilirubin, UA: NEGATIVE
Blood, UA: NEGATIVE
Glucose, UA: NEGATIVE mg/dL
Nitrite, UA: NEGATIVE
Spec Grav, UA: >=1.03 — AB (ref 1.010–1.025)
Urobilinogen, UA: 0.2 U/dL
pH, UA: 5 (ref 5.0–8.0)

## 2023-09-27 MED ORDER — OMEPRAZOLE 20 MG PO CPDR: 20.0000 mg | DELAYED_RELEASE_CAPSULE | Freq: Every day | ORAL | 3 refills | Status: DC

## 2023-09-27 MED ORDER — CIPROFLOXACIN HCL 500 MG PO TABS: 500.0000 mg | ORAL_TABLET | Freq: Two times a day (BID) | ORAL | 0 refills | Status: AC

## 2023-09-27 NOTE — Telephone Encounter (Signed)
 Copied from CRM [id number]. Topic: Clinical - Medical Advice >> Sep 27, 2023  3:12 PM Adaysia C wrote: Reason for CRM: Patient was just seen in the office this morning on 09/27/2023. The patient has symptoms of a yeast infection and while in the office visit the provider said she needed to do a self vaginal swab but the provider and the patient forgot to do the swab. The patient called in to see if the provider can test for the yeast infection using the urine sample she provided or if she will need to come back into the clinic to do the vaginal swab. Please follow up with patient asap #[phone]    Disposition: [] ED /[] Urgent Care (no appt availability in office) / [] Appointment(In office/virtual)/ []  [facility]/ [] Home Care/ [] Refused Recommended Disposition /[] [facility]/ [x]  Follow-up with PCP Additional Notes: Patient states that she was in the office this morning and was supposed to do a self swab but left the office without doing the self swab.  She wanted to know if she could possibly stop by tomorrow 09/28/2023 on her lunch break and just do the self swab?  She advised that she did provide a urine though.  Reason for Disposition . [1] Caller requesting NON-URGENT health information AND [2] PCP's office is the best resource  Answer Assessment - Initial Assessment Questions 1. REASON FOR CALL or QUESTION: What is your reason for calling today? or How can I best help you? or What question do you have that I can help answer?     Patient states that she was in the office this morning and was supposed to do a self swab but left the office without doing the self swab.  She wanted to know if she could possibly stop by tomorrow 09/28/2023 on her lunch break and just do the self swab?  Protocols used: Information Only Call - No Triage-A-AH

## 2023-09-27 NOTE — Assessment & Plan Note (Signed)
 Wet prep ordered pending results.  Diflucan if needed pending results  R/o BV and or yeast

## 2023-09-27 NOTE — Assessment & Plan Note (Signed)
 Suspected heart burn  Trial omeprazole 20 mg once daily x two weeks Try to decrease and or avoid spicy foods, fried fatty foods, and also caffeine and chocolate as these can increase heartburn symptoms.

## 2023-09-27 NOTE — Progress Notes (Signed)
 Established Patient Office Visit  Subjective:   Patient ID: Taylor Bautista, female    DOB: Aug 05, 1991  Age: 33 y.o. MRN: [id number]  CC:  Chief Complaint  Patient presents with   Possible Yeast Infection    HPI: Taylor Bautista is a 33 y.o. female presenting on 09/27/2023 for Possible Yeast Infection  About one week ago with a thick white discharge. No vaginal itching but does have a slight yeasty smell and increased urinary frequency. Slight urinary urgency. No new back pain. No fever or chills. Some slight lower abdominal pain she describes as cramping.   Changed laundry detergent about one week ago. She has not changed this back.   Over the last two months with increased indigestion will have central chest pains and at times will feel like something is in her chest, will cough and phlegm will come up. She does state might feel acid reflux. Doesn't seem to change with any change in foods. She is a smoker. Has not had nausea or vomiting.        ROS: Negative unless specifically indicated above in HPI.   Relevant past medical history reviewed and updated as indicated.   Allergies and medications reviewed and updated.   Current Outpatient Medications:    ciprofloxacin  (CIPRO ) 500 MG tablet, Take 1 tablet (500 mg total) by mouth 2 (two) times daily for 7 days., Disp: 14 tablet, Rfl: 0   cyanocobalamin  (VITAMIN B12) 1000 MCG/ML injection, INJECT 1 ML (1,000 MCG) INTRAMUSCULARLY EVERY 30 DAYS, Disp: 3 mL, Rfl: 1   omeprazole  (PRILOSEC) 20 MG capsule, Take 1 capsule (20 mg total) by mouth daily., Disp: 30 capsule, Rfl: 3  No Known Allergies  Objective:   BP (!) 92/52 (BP Location: Left Arm, Patient Position: Sitting, Cuff Size: Normal)   Pulse 95   Temp 98.3 F (36.8 C) (Temporal)   Ht 5' 5 (1.651 m)   SpO2 99%   BMI 24.30 kg/m    Physical Exam Constitutional:      General: She is not in acute distress.    Appearance: Normal appearance. She is normal weight. She  is not ill-appearing, toxic-appearing or diaphoretic.  Cardiovascular:     Rate and Rhythm: Normal rate.  Pulmonary:     Effort: Pulmonary effort is normal.  Abdominal:     General: Abdomen is flat.     Tenderness: There is abdominal tenderness in the epigastric area and suprapubic area. There is no right CVA tenderness or left CVA tenderness.     Hernia: No hernia is present.  Genitourinary:    Comments: Right CVA tenderness  Neurological:     General: No focal deficit present.     Mental Status: She is alert and oriented to person, place, and time. Mental status is at baseline.     Motor: No weakness.  Psychiatric:        Mood and Affect: Mood normal.        Behavior: Behavior normal.        Thought Content: Thought content normal.        Judgment: Judgment normal.     Assessment & Plan:  Abnormal urine odor -     POCT URINE DIPSTICK -     Urinalysis w microscopic + reflex cultur; Future  Leukocytes in urine -     Urinalysis w microscopic + reflex cultur; Future -     Ciprofloxacin  HCl; Take 1 tablet (500 mg total) by mouth 2 (two) times daily for  7 days.  Dispense: 14 tablet; Refill: 0  Vaginal discharge Assessment & Plan: Wet prep ordered pending results.  Diflucan  if needed pending results  R/o BV and or yeast   Orders: -     WET PREP BY MOLECULAR PROBE -     Ciprofloxacin  HCl; Take 1 tablet (500 mg total) by mouth 2 (two) times daily for 7 days.  Dispense: 14 tablet; Refill: 0  Heartburn Assessment & Plan: Suspected heart burn  Trial omeprazole  20 mg once daily x two weeks Try to decrease and or avoid spicy foods, fried fatty foods, and also caffeine and chocolate as these can increase heartburn symptoms.    Orders: -     Omeprazole ; Take 1 capsule (20 mg total) by mouth daily.  Dispense: 30 capsule; Refill: 3  Right flank pain Assessment & Plan: Right flank pain with tenderness on exam.  Did advise red flag symptoms to monitor for for kidney stone.  Rx  cipro  to protect against possible kidney infection   Orders: -     Ciprofloxacin  HCl; Take 1 tablet (500 mg total) by mouth 2 (two) times daily for 7 days.  Dispense: 14 tablet; Refill: 0     Follow up plan: Return in about 2 weeks (around 10/11/2023) for follow up epigastric pain .  Ginger Patrick, FNP

## 2023-09-27 NOTE — Assessment & Plan Note (Signed)
 Right flank pain with tenderness on exam.  Did advise red flag symptoms to monitor for for kidney stone.  Rx cipro to protect against possible kidney infection

## 2023-09-28 ENCOUNTER — Encounter: Payer: Self-pay | Admitting: Family

## 2023-09-28 ENCOUNTER — Other Ambulatory Visit: Payer: 59

## 2023-09-28 ENCOUNTER — Other Ambulatory Visit: Payer: Self-pay | Admitting: Radiology

## 2023-09-28 DIAGNOSIS — N898 Other specified noninflammatory disorders of vagina: Secondary | ICD-10-CM

## 2023-09-28 NOTE — Addendum Note (Signed)
 Addended by: Gerry Krone on: 09/28/2023 01:04 PM   Modules accepted: Orders

## 2023-09-29 ENCOUNTER — Encounter: Payer: Self-pay | Admitting: Family

## 2023-09-29 LAB — WET PREP BY MOLECULAR PROBE
Candida species: NOT DETECTED
Gardnerella vaginalis: NOT DETECTED
MICRO NUMBER:: 15958929
SPECIMEN QUALITY:: ADEQUATE
Trichomonas vaginosis: NOT DETECTED

## 2023-09-29 NOTE — Telephone Encounter (Signed)
Thanks [employer]. This has already been addressed.

## 2023-09-30 LAB — URINE CULTURE
MICRO NUMBER:: 15954977
MICRO NUMBER:: 15952913
Result:: NO GROWTH
SPECIMEN QUALITY:: ADEQUATE
SPECIMEN QUALITY:: ADEQUATE

## 2023-09-30 LAB — WET PREP BY MOLECULAR PROBE

## 2023-09-30 LAB — URINALYSIS W MICROSCOPIC + REFLEX CULTURE
Bilirubin Urine: NEGATIVE
Glucose, UA: NEGATIVE
Hgb urine dipstick: NEGATIVE
Hyaline Cast: NONE SEEN /[LPF]
Nitrites, Initial: NEGATIVE
Protein, ur: NEGATIVE
RBC / HPF: NONE SEEN /[HPF] (ref 0–2)
Specific Gravity, Urine: 1.019 (ref 1.001–1.035)
pH: <=5 (ref 5.0–8.0)

## 2023-09-30 LAB — CULTURE INDICATED

## 2023-10-03 ENCOUNTER — Encounter: Payer: Self-pay | Admitting: Family

## 2023-10-03 NOTE — Telephone Encounter (Signed)
Copying you on this thread.  I am likely going to have her f/u with you in the office.  Urine wet prep negative.

## 2023-11-03 ENCOUNTER — Ambulatory Visit: Payer: 59 | Admitting: Nurse Practitioner

## 2023-11-03 ENCOUNTER — Encounter: Payer: Self-pay | Admitting: Nurse Practitioner

## 2023-11-03 VITALS — BP 110/70 | HR 85 | Temp 98.5°F | Resp 17 | Ht 65.0 in | Wt 141.5 lb

## 2023-11-03 DIAGNOSIS — J029 Acute pharyngitis, unspecified: Secondary | ICD-10-CM | POA: Diagnosis not present

## 2023-11-03 DIAGNOSIS — J069 Acute upper respiratory infection, unspecified: Secondary | ICD-10-CM | POA: Insufficient documentation

## 2023-11-03 LAB — POCT RAPID STREP A (OFFICE): Rapid Strep A Screen: NEGATIVE

## 2023-11-03 LAB — POCT INFLUENZA A/B
Influenza A, POC: NEGATIVE
Influenza B, POC: NEGATIVE

## 2023-11-03 MED ORDER — AMOXICILLIN-POT CLAVULANATE 875-125 MG PO TABS: 1.0000 | ORAL_TABLET | Freq: Two times a day (BID) | ORAL | 0 refills | Status: DC

## 2023-11-03 MED ORDER — PREDNISONE 20 MG PO TABS: 20.0000 mg | ORAL_TABLET | Freq: Every day | ORAL | 0 refills | Status: AC

## 2023-11-03 NOTE — Assessment & Plan Note (Signed)
Pt reports sore throat, frontal and maxillary tenderness, congestion, plugged ear sensation. Positive exposure to strep and flu. Tested negative for flu and strep. Given sinus tenderness will treat with  Augmentin and prednisone.  Advised to take probiotic while on antibiotic.  Perform salt water gargles for sore throat and use tylenol for pain.   Take plain mucinex OTC for congestion.

## 2023-11-03 NOTE — Progress Notes (Signed)
Established Patient Office Visit  Subjective:  Patient ID: Taylor Bautista, female    DOB: [birth year]  Age: 33 y.o. MRN: [id number]  CC:  Chief Complaint  Patient presents with   Sore Throat    C/O sore throat & headache, ear ache, & congestion since yesterday. Tried dayquil & nyquil with no relief-Throat pain is at a "8*    HPI  Taylor Bautista presents for:  Sore Throat  This is a new problem. Episode onset: 2 days ago. The problem has been unchanged. The pain is at a severity of 8/10 (throat bilaterally). Associated symptoms include congestion, coughing, ear pain, headaches, a plugged ear sensation and trouble swallowing. Pertinent negatives include no diarrhea or shortness of breath. Associated symptoms comments: Sinus pressure: frontal and maxillary. She has had exposure to strep. Exposure to: Flu. Treatments tried: Dayquil and nyquil. The treatment provided mild relief.    Past Medical History:  Diagnosis Date   Allergy    Anxiety    no meds   Dysmenorrhea    Dysthymic disorder    Exposure to STD    MRSA infection    Sleep apnea    pt states she does not use her CPAP    Tobacco use disorder     Past Surgical History:  Procedure Laterality Date   FOOT SURGERY  2016   LAPAROSCOPIC TUBAL LIGATION Bilateral 08/02/2018   Procedure: LAPAROSCOPIC TUBAL LIGATION - Filshie Clips;  Surgeon:  Bing, MD;  Location: [facility];  Service: Gynecology;  Laterality: Bilateral;   Pigmented nevus removal  04/1989    Family History  Problem Relation Age of Onset   Breast cancer Mother 52       initial 47, recurrence at 71   Cancer Sister 69       colon cancer   Anxiety disorder Sister    Depression Sister    Cancer Sister 23       lymphnode cancer   Breast cancer Cousin 24       breast   Anxiety disorder Other     Social History   Socioeconomic History   Marital status: Divorced    Spouse name: Not on file   Number of children: 3   Years  of education: Not on file   Highest education level: Not on file  Occupational History   Not on file  Tobacco Use   Smoking status: Former    Current packs/day: 0.00    Average packs/day: 0.5 packs/day for 10.0 years (5.0 ttl pk-yrs)    Types: Cigarettes    Start date: 09/27/2008    Quit date: 09/27/2018    Years since quitting: 5.1   Smokeless tobacco: Never  Vaping Use   Vaping status: Never Used  Substance and Sexual Activity   Alcohol use: Yes    Comment: very rare   Drug use: Not Currently    Comment: MJ occasionally; 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   Sexual activity: Not Currently    Partners: Male  Other Topics Concern   Not on file  Social History Narrative   Full time [employer]   No hobbies   Social Drivers of Corporate investment banker Strain: Not on file  Food Insecurity: Not on file  Transportation Needs: Not on file  Physical Activity: Not on file  Stress: Not on file  Social Connections: Not on file  Intimate Partner Violence: Not on file     Outpatient Medications  Prior to Visit  Medication Sig Dispense Refill   cyanocobalamin (VITAMIN B12) 1000 MCG/ML injection INJECT 1 ML (1,000 MCG) INTRAMUSCULARLY EVERY 30 DAYS 3 mL 1   omeprazole (PRILOSEC) 20 MG capsule Take 1 capsule (20 mg total) by mouth daily. 30 capsule 3   No facility-administered medications prior to visit.    No Known Allergies  ROS Review of Systems  HENT:  Positive for congestion, ear pain and trouble swallowing.   Respiratory:  Positive for cough. Negative for shortness of breath.   Gastrointestinal:  Negative for diarrhea.  Neurological:  Positive for headaches.   Negative unless indicated in HPI.    Objective:    Physical Exam Constitutional:      Appearance: She is well-developed.  HENT:     Head: Normocephalic.     Right Ear: A middle ear effusion is present.     Left Ear: A middle ear effusion is present.     Nose: Congestion present.     Right  Sinus: Maxillary sinus tenderness and frontal sinus tenderness present.     Left Sinus: Maxillary sinus tenderness and frontal sinus tenderness present.     Mouth/Throat:     Tonsils: No tonsillar exudate. 1+ on the right. 1+ on the left.  Eyes:     Conjunctiva/sclera: Conjunctivae normal.  Cardiovascular:     Rate and Rhythm: Normal rate and regular rhythm.     Heart sounds: Normal heart sounds.  Pulmonary:     Effort: Pulmonary effort is normal.     Breath sounds: Normal breath sounds.  Skin:    Capillary Refill: Capillary refill takes less than 2 seconds.  Neurological:     General: No focal deficit present.     Mental Status: She is alert and oriented to person, place, and time.  Psychiatric:        Mood and Affect: Mood normal.        Behavior: Behavior normal.     BP 110/70 (Cuff Size: Normal)   Pulse 85   Temp 98.5 F (36.9 C) (Oral)   Resp 17   Ht 5\' 5"  (1.651 m)   Wt 141 lb 8 oz (64.2 kg)   BMI 23.55 kg/m  Wt Readings from Last 3 Encounters:  11/03/23 141 lb 8 oz (64.2 kg)  06/29/23 146 lb (66.2 kg)  05/25/23 144 lb 6 oz (65.5 kg)     Health Maintenance  Topic Date Due   INFLUENZA VACCINE  12/12/2023 (Originally 04/14/2023)   DTaP/Tdap/Td (4 - Td or Tdap) 06/01/2026   Cervical Cancer Screening (HPV/Pap Cotest)  10/15/2027   Hepatitis C Screening  Completed   HIV Screening  Completed   HPV VACCINES  Aged Out   COVID-19 Vaccine  Discontinued    There are no preventive care reminders to display for this patient.  Lab Results  Component Value Date   TSH 1.09 11/17/2021   Lab Results  Component Value Date   WBC 6.7 05/25/2023   HGB 13.2 05/25/2023   HCT 39.6 05/25/2023   MCV 93.3 05/25/2023   PLT 273.0 05/25/2023   Lab Results  Component Value Date   NA 138 05/25/2023   K 3.8 05/25/2023   CO2 26 05/25/2023   GLUCOSE 86 05/25/2023   BUN 10 05/25/2023   CREATININE 0.68 05/25/2023   BILITOT 0.4 05/25/2023   ALKPHOS 53 05/25/2023   AST 20  05/25/2023   ALT 23 05/25/2023   PROT 6.2 05/25/2023   ALBUMIN 4.1 05/25/2023  CALCIUM 9.2 05/25/2023   ANIONGAP 7 08/02/2022   GFR 113.68 05/25/2023   Lab Results  Component Value Date   CHOL 174 11/17/2021   Lab Results  Component Value Date   HDL 53.00 11/17/2021   Lab Results  Component Value Date   LDLCALC 109 (H) 11/17/2021   Lab Results  Component Value Date   TRIG 58.0 11/17/2021   Lab Results  Component Value Date   CHOLHDL 3 11/17/2021   No results found for: "HGBA1C"    Assessment & Plan:  Upper respiratory tract infection, unspecified type Assessment & Plan: Pt reports sore throat, frontal and maxillary tenderness, congestion, plugged ear sensation. Positive exposure to strep and flu. Tested negative for flu and strep. Given sinus tenderness will treat with  Augmentin and prednisone.  Advised to take probiotic while on antibiotic.  Perform salt water gargles for sore throat and use tylenol for pain.   Take plain mucinex OTC for congestion.   Orders: -     Amoxicillin-Pot Clavulanate; Take 1 tablet by mouth 2 (two) times daily.  Dispense: 20 tablet; Refill: 0 -     predniSONE; Take 1 tablet (20 mg total) by mouth daily with breakfast for 5 days.  Dispense: 5 tablet; Refill: 0  Sore throat -     POCT rapid strep A -     POCT Influenza A/B    Follow-up: Return if symptoms worsen or fail to improve.   Kara Dies, NP

## 2023-11-03 NOTE — Patient Instructions (Addendum)
Tested negative for flu and strep. I have sent Augmentin and prednisone for sinus pressure. Take probiotic to reduce GI side feect while on antibiotic.   Take OTC plain mucinex for congestion, perform salt water gargles and tylenol for pain.  Let us know if symptoms not improving.

## 2023-11-16 ENCOUNTER — Other Ambulatory Visit (HOSPITAL_COMMUNITY)
Admission: RE | Admit: 2023-11-16 | Discharge: 2023-11-16 | Disposition: A | Discharge status: Home / Self Care | Source: Ambulatory Visit | Attending: Family Medicine | Admitting: Family Medicine

## 2023-11-16 ENCOUNTER — Ambulatory Visit

## 2023-11-16 DIAGNOSIS — R399 Unspecified symptoms and signs involving the genitourinary system: Secondary | ICD-10-CM | POA: Diagnosis not present

## 2023-11-16 DIAGNOSIS — N898 Other specified noninflammatory disorders of vagina: Secondary | ICD-10-CM

## 2023-11-16 LAB — POCT URINALYSIS DIPSTICK
Glucose, UA: NEGATIVE
Ketones, UA: NEGATIVE
Leukocytes, UA: NEGATIVE
Protein, UA: NEGATIVE

## 2023-11-16 NOTE — Progress Notes (Signed)
 SUBJECTIVE:  33 y.o. female complains of  urinary urgency and urinary frequency Denies abnormal vaginal bleeding or significant pelvic pain or fever.Denies history of known exposure to STD.  No LMP recorded.  OBJECTIVE:  She appears alert, well appearing, in no apparent distress Urine dipstick: negative for all components.  ASSESSMENT:  Vaginal Discharge  Vaginal Odor Dysuria    PLAN:  GC, chlamydia, trichomonas, BVAG, CVAG probe and urine culture sent to lab. Treatment: To be determined once lab results are received ROV prn if symptoms persist or worsen.

## 2023-11-18 LAB — URINE CULTURE

## 2023-11-18 LAB — CERVICOVAGINAL ANCILLARY ONLY
Bacterial Vaginitis (gardnerella): NEGATIVE
Candida Glabrata: NEGATIVE
Candida Vaginitis: POSITIVE — AB
Chlamydia: NEGATIVE
Comment: NEGATIVE
Comment: NEGATIVE
Comment: NEGATIVE
Comment: NEGATIVE
Comment: NEGATIVE
Comment: NORMAL
Neisseria Gonorrhea: NEGATIVE
Trichomonas: NEGATIVE

## 2023-11-22 ENCOUNTER — Other Ambulatory Visit: Payer: Self-pay | Admitting: *Deleted

## 2023-11-22 MED ORDER — FLUCONAZOLE 150 MG PO TABS: 150.0000 mg | ORAL_TABLET | Freq: Once | ORAL | 3 refills | Status: AC

## 2023-12-23 ENCOUNTER — Other Ambulatory Visit: Payer: Self-pay | Admitting: Nurse Practitioner

## 2023-12-23 DIAGNOSIS — R12 Heartburn: Secondary | ICD-10-CM

## 2024-01-28 ENCOUNTER — Other Ambulatory Visit: Payer: Self-pay | Admitting: Nurse Practitioner

## 2024-03-27 NOTE — Progress Notes (Unsigned)
     Shaivi Rothschild T. Adynn Caseres, MD, CAQ Sports Medicine [facility] [address] [place], [address]  Phone: [phone]  FAX: [phone]  JOHN VASCONCELOS - 33 y.o. female  MRN [id number]  Date of Birth: 1990-10-07  Date: 03/28/2024  PCP: Wendee Lynwood HERO, NP  Referral: Wendee Lynwood HERO, NP  No chief complaint on file.  Subjective:   Chrysa R Ravenscroft is a 33 y.o. very pleasant female patient with There is no height or weight on file to calculate BMI. who presents with the following:  Patient presents with vaginal discharge and possible yeast infection.    Review of Systems is noted in the HPI, as appropriate  Objective:   There were no vitals taken for this visit.  GEN: No acute distress; alert,appropriate. PULM: Breathing comfortably in no respiratory distress PSYCH: Normally interactive.   Laboratory and Imaging Data:  Assessment and Plan:   ***

## 2024-03-28 ENCOUNTER — Encounter: Payer: Self-pay | Admitting: Family Medicine

## 2024-03-28 ENCOUNTER — Ambulatory Visit (INDEPENDENT_AMBULATORY_CARE_PROVIDER_SITE_OTHER): Admitting: Family Medicine

## 2024-03-28 VITALS — BP 100/74 | HR 61 | Temp 99.6°F | Ht 65.0 in | Wt 151.4 lb

## 2024-03-28 DIAGNOSIS — N898 Other specified noninflammatory disorders of vagina: Secondary | ICD-10-CM

## 2024-03-28 DIAGNOSIS — M25531 Pain in right wrist: Secondary | ICD-10-CM

## 2024-03-28 DIAGNOSIS — N949 Unspecified condition associated with female genital organs and menstrual cycle: Secondary | ICD-10-CM | POA: Diagnosis not present

## 2024-03-28 DIAGNOSIS — R300 Dysuria: Secondary | ICD-10-CM | POA: Diagnosis not present

## 2024-03-28 DIAGNOSIS — M25532 Pain in left wrist: Secondary | ICD-10-CM

## 2024-03-28 DIAGNOSIS — G8929 Other chronic pain: Secondary | ICD-10-CM

## 2024-03-28 LAB — POC URINALSYSI DIPSTICK (AUTOMATED)
Bilirubin, UA: NEGATIVE
Glucose, UA: NEGATIVE
Ketones, UA: NEGATIVE
Leukocytes, UA: NEGATIVE
Nitrite, UA: NEGATIVE
Protein, UA: NEGATIVE
Spec Grav, UA: 1.015 (ref 1.010–1.025)
Urobilinogen, UA: 0.2 U/dL
pH, UA: 6 (ref 5.0–8.0)

## 2024-03-28 MED ORDER — FLUCONAZOLE 150 MG PO TABS: ORAL_TABLET | ORAL | 0 refills | Status: DC

## 2024-03-28 MED ORDER — CELECOXIB 200 MG PO CAPS: 200.0000 mg | ORAL_CAPSULE | Freq: Every day | ORAL | 2 refills | Status: DC

## 2024-03-28 NOTE — Patient Instructions (Signed)
 Voltaren 1% gel, over the counter ?You can apply up to 4 times a day ? ?This can be applied to any joint: knee, wrist, fingers, elbows, shoulders, feet and ankles. ?Can apply to any tendon: tennis elbow, achilles, tendon, rotator cuff or any other tendon. ? ?Minimal is absorbed in the bloodstream: ok with oral anti-inflammatory or a blood thinner. ? ?Cost is about 9 dollars  ?

## 2024-03-29 ENCOUNTER — Encounter: Payer: Self-pay | Admitting: Family Medicine

## 2024-03-29 ENCOUNTER — Ambulatory Visit: Payer: Self-pay | Admitting: Family Medicine

## 2024-03-29 NOTE — Telephone Encounter (Signed)
 Copied from CRM [id number]. Topic: Clinical - Request for Lab/Test Order >> Mar 29, 2024  9:38 AM Suzen RAMAN wrote: Reason for CRM: Corean from [facility] called to inform office that wrong sample was collected for patient and another  BV vaginitis panel needs to be drawn for patient.   CB#[phone]

## 2024-03-29 NOTE — Addendum Note (Signed)
 Addended by: ISADORA RAISIN on: 03/29/2024 10:58 AM   Modules accepted: Orders

## 2024-03-30 ENCOUNTER — Other Ambulatory Visit: Payer: Self-pay | Admitting: Family Medicine

## 2024-03-30 ENCOUNTER — Telehealth: Payer: Self-pay | Admitting: *Deleted

## 2024-03-30 DIAGNOSIS — N949 Unspecified condition associated with female genital organs and menstrual cycle: Secondary | ICD-10-CM

## 2024-03-30 DIAGNOSIS — N898 Other specified noninflammatory disorders of vagina: Secondary | ICD-10-CM

## 2024-03-30 MED ORDER — DICLOFENAC SODIUM 75 MG PO TBEC
75.0000 mg | DELAYED_RELEASE_TABLET | Freq: Two times a day (BID) | ORAL | 3 refills | Status: DC
Start: 2024-03-30 — End: 2024-05-01

## 2024-03-30 NOTE — Addendum Note (Signed)
 Addended by: WATT MIRZA on: 03/30/2024 12:07 PM   Modules accepted: Orders

## 2024-03-30 NOTE — Telephone Encounter (Signed)
 Copied from CRM [id number]. Topic: Clinical - Request for Lab/Test Order >> Mar 29, 2024  9:38 AM Suzen RAMAN wrote: Reason for CRM: Corean from [facility] called to inform office that wrong sample was collected for patient and another  BV vaginitis panel needs to be drawn for patient.   CB#[phone] >> Mar 29, 2024  4:01 PM Martinique E wrote: Patient called in regarding her lab results and would like to speak with someone in office about these results, thank you.

## 2024-03-30 NOTE — Progress Notes (Signed)
wet 

## 2024-03-30 NOTE — Telephone Encounter (Signed)
 I really do need an answer about the wet prep, so the patient can know if she needs to give another sample.

## 2024-04-02 LAB — WET PREP BY MOLECULAR PROBE
Candida species: DETECTED — AB
MICRO NUMBER:: 16717670
SPECIMEN QUALITY:: ADEQUATE
Trichomonas vaginosis: NOT DETECTED

## 2024-04-03 ENCOUNTER — Ambulatory Visit: Payer: Self-pay | Admitting: Family Medicine

## 2024-04-03 MED ORDER — METRONIDAZOLE 500 MG PO TABS: 500.0000 mg | ORAL_TABLET | Freq: Two times a day (BID) | ORAL | 0 refills | Status: DC

## 2024-04-03 MED ORDER — FLUCONAZOLE 150 MG PO TABS: 150.0000 mg | ORAL_TABLET | ORAL | 0 refills | Status: DC

## 2024-04-03 NOTE — Addendum Note (Signed)
 Addended by: WATT MIRZA on: 04/03/2024 08:35 AM   Modules accepted: Orders

## 2024-04-04 LAB — WET PREP BY MOLECULAR PROBE

## 2024-04-04 LAB — C. TRACHOMATIS/N. GONORRHOEAE RNA
C. trachomatis RNA, TMA: NOT DETECTED
N. gonorrhoeae RNA, TMA: NOT DETECTED

## 2024-04-04 LAB — TIQ-NTM

## 2024-05-01 ENCOUNTER — Ambulatory Visit: Payer: Self-pay | Admitting: Family Medicine

## 2024-05-01 ENCOUNTER — Encounter: Payer: Self-pay | Admitting: Family Medicine

## 2024-05-01 ENCOUNTER — Ambulatory Visit: Admitting: Family Medicine

## 2024-05-01 ENCOUNTER — Ambulatory Visit (INDEPENDENT_AMBULATORY_CARE_PROVIDER_SITE_OTHER)
Admission: RE | Admit: 2024-05-01 | Discharge: 2024-05-01 | Disposition: A | Discharge status: Home / Self Care | Source: Ambulatory Visit | Attending: Family Medicine | Admitting: Family Medicine

## 2024-05-01 VITALS — BP 106/68 | HR 65 | Temp 98.6°F | Ht 65.0 in | Wt 148.5 lb

## 2024-05-01 DIAGNOSIS — M25532 Pain in left wrist: Secondary | ICD-10-CM | POA: Diagnosis not present

## 2024-05-01 DIAGNOSIS — G8929 Other chronic pain: Secondary | ICD-10-CM

## 2024-05-01 DIAGNOSIS — R5383 Other fatigue: Secondary | ICD-10-CM | POA: Diagnosis not present

## 2024-05-01 NOTE — Progress Notes (Signed)
 Patient ID: Taylor Bautista, female    DOB: [birth year], 33 y.o.   MRN: [id number]  This visit was conducted in person.  BP 106/68   Pulse 65   Temp 98.6 F (37 C) (Temporal)   Ht 5' 5 (1.651 m)   Wt 148 lb 8 oz (67.4 kg)   LMP 04/17/2024 (Approximate)   SpO2 99%   BMI 24.71 kg/m    CC:  Chief Complaint  Patient presents with   Wrist Pain    Bilateral but Left is worse-Seen by Dr. Watt on 03/28/2024 and given Diclofenac  which she states didn't help any   Focus Issues    Subjective:   HPI: Taylor Bautista is a 33 y.o. female presenting on 05/01/2024 for Wrist Pain (Bilateral but Left is worse-Seen by Dr. Watt on 03/28/2024 and given Diclofenac  which she states didn't help any) and Focus Issues   Bilateral wrist pain , left greater than right ongoing for > 1 year.   Reviewed OV for similar issue on 03/28/2024 Treated with diclofenac  but this did not help. She does do repetitive motion tasks for the [employer], ( constant side to side motion of wrists) she has done this for many years She has pain more on the dorsum of the right wrist as well as in the ulnar and radial wrist joints on the right and the left.  No redness or swelling.   Pain in lateral wrist.. extends down left forearm and down to 4th digit.  Occ numbness in 4th digit.  She has had not had any kind of specific injury.   No past X-ray.   No better with diclofenac   (could not afford celebrex ), topical diclofenac , ibuprofen .   She has been noting months of trouble focusing. Overwhelemed... lots going on in life as single Mom.  Denies depression. She is exhausted all the time.  Did have ADD as a child but never dx or treated.  On B12  Relevant past medical, surgical, family and social history reviewed and updated as indicated. Interim medical history since our last visit reviewed. Allergies and medications reviewed and updated. Outpatient Medications Prior to Visit  Medication Sig  Dispense Refill   cyanocobalamin  (VITAMIN B12) 1000 MCG/ML injection INJECT 1 ML (1,000 MCG) INTRAMUSCULARLY EVERY 30 DAYS 3 mL 1   omeprazole  (PRILOSEC) 20 MG capsule Take 1 capsule (20 mg total) by mouth daily. 30 capsule 3   diclofenac  (VOLTAREN ) 75 MG EC tablet Take 1 tablet (75 mg total) by mouth 2 (two) times daily. 60 tablet 3   fluconazole  (DIFLUCAN ) 150 MG tablet Take 1 tablet (150 mg total) by mouth every 3 (three) days. 3 tablet 0   metroNIDAZOLE  (FLAGYL ) 500 MG tablet Take 1 tablet (500 mg total) by mouth 2 (two) times daily. 14 tablet 0   No facility-administered medications prior to visit.     Per HPI unless specifically indicated in ROS section below Review of Systems  Constitutional:  Negative for fatigue and fever.  HENT:  Negative for ear pain.   Eyes:  Negative for pain.  Respiratory:  Negative for chest tightness and shortness of breath.   Cardiovascular:  Negative for chest pain, palpitations and leg swelling.  Gastrointestinal:  Negative for abdominal pain.  Genitourinary:  Negative for dysuria.   Objective:  BP 106/68   Pulse 65   Temp 98.6 F (37 C) (Temporal)   Ht 5' 5 (1.651 m)   Wt 148 lb 8 oz (67.4 kg)  LMP 04/17/2024 (Approximate)   SpO2 99%   BMI 24.71 kg/m   Wt Readings from Last 3 Encounters:  05/07/24 149 lb (67.6 kg)  05/01/24 148 lb 8 oz (67.4 kg)  03/28/24 151 lb 6 oz (68.7 kg)      Physical Exam Constitutional:      General: She is not in acute distress.    Appearance: Normal appearance. She is well-developed. She is not ill-appearing or toxic-appearing.  HENT:     Head: Normocephalic.     Right Ear: Hearing, tympanic membrane, ear canal and external ear normal. Tympanic membrane is not erythematous, retracted or bulging.     Left Ear: Hearing, tympanic membrane, ear canal and external ear normal. Tympanic membrane is not erythematous, retracted or bulging.     Nose: No mucosal edema or rhinorrhea.     Right Sinus: No maxillary  sinus tenderness or frontal sinus tenderness.     Left Sinus: No maxillary sinus tenderness or frontal sinus tenderness.     Mouth/Throat:     Pharynx: Uvula midline.  Eyes:     General: Lids are normal. Lids are everted, no foreign bodies appreciated.     Conjunctiva/sclera: Conjunctivae normal.     Pupils: Pupils are equal, round, and reactive to light.  Neck:     Thyroid : No thyroid  mass or thyromegaly.     Vascular: No carotid bruit.     Trachea: Trachea normal.  Cardiovascular:     Rate and Rhythm: Normal rate and regular rhythm.     Pulses: Normal pulses.     Heart sounds: Normal heart sounds, S1 normal and S2 normal. No murmur heard.    No friction rub. No gallop.  Pulmonary:     Effort: Pulmonary effort is normal. No tachypnea or respiratory distress.     Breath sounds: Normal breath sounds. No decreased breath sounds, wheezing, rhonchi or rales.  Abdominal:     General: Bowel sounds are normal.     Palpations: Abdomen is soft.     Tenderness: There is no abdominal tenderness.  Musculoskeletal:     Right shoulder: Normal.     Left shoulder: Normal.     Right upper arm: Normal.     Left upper arm: Normal.     Right wrist: Tenderness and bony tenderness present. No snuff box tenderness. Decreased range of motion. Normal pulse.     Left wrist: Tenderness and bony tenderness present. No snuff box tenderness. Decreased range of motion. Normal pulse.     Cervical back: Normal, normal range of motion and neck supple. No tenderness or bony tenderness. No pain with movement. Normal range of motion.  Skin:    General: Skin is warm and dry.     Findings: No rash.  Neurological:     Mental Status: She is alert.  Psychiatric:        Mood and Affect: Mood is not anxious or depressed.        Speech: Speech normal.        Behavior: Behavior normal. Behavior is cooperative.        Thought Content: Thought content normal.        Judgment: Judgment normal.       Results for orders  placed or performed in visit on 05/01/24  CBC with Differential/Platelet   Collection Time: 05/01/24  4:11 PM  Result Value Ref Range   WBC 6.3 4.0 - 10.5 K/uL   RBC 4.27 3.87 - 5.11 Mil/uL   Hemoglobin  13.4 12.0 - 15.0 g/dL   HCT 60.8 63.9 - 53.9 %   MCV 91.7 78.0 - 100.0 fl   MCHC 34.1 30.0 - 36.0 g/dL   RDW 87.3 88.4 - 84.4 %   Platelets 269.0 150.0 - 400.0 K/uL   Neutrophils Relative % 53.2 43.0 - 77.0 %   Lymphocytes Relative 33.9 12.0 - 46.0 %   Monocytes Relative 10.8 3.0 - 12.0 %   Eosinophils Relative 1.9 0.0 - 5.0 %   Basophils Relative 0.2 0.0 - 3.0 %   Neutro Abs 3.4 1.4 - 7.7 K/uL   Lymphs Abs 2.1 0.7 - 4.0 K/uL   Monocytes Absolute 0.7 0.1 - 1.0 K/uL   Eosinophils Absolute 0.1 0.0 - 0.7 K/uL   Basophils Absolute 0.0 0.0 - 0.1 K/uL  Comprehensive metabolic panel with GFR   Collection Time: 05/01/24  4:11 PM  Result Value Ref Range   Sodium 137 135 - 145 mEq/L   Potassium 4.0 3.5 - 5.1 mEq/L   Chloride 105 96 - 112 mEq/L   CO2 27 19 - 32 mEq/L   Glucose, Bld 87 70 - 99 mg/dL   BUN 12 6 - 23 mg/dL   Creatinine, Ser 9.26 0.40 - 1.20 mg/dL   Total Bilirubin 0.3 0.2 - 1.2 mg/dL   Alkaline Phosphatase 41 39 - 117 U/L   AST 15 0 - 37 U/L   ALT 12 0 - 35 U/L   Total Protein 6.6 6.0 - 8.3 g/dL   Albumin 4.2 3.5 - 5.2 g/dL   GFR 893.35 >39.99 mL/min   Calcium 8.7 8.4 - 10.5 mg/dL  TSH   Collection Time: 05/01/24  4:11 PM  Result Value Ref Range   TSH 1.39 0.35 - 5.50 uIU/mL  VITAMIN D  25 Hydroxy (Vit-D Deficiency, Fractures)   Collection Time: 05/01/24  4:11 PM  Result Value Ref Range   VITD 60.08 30.00 - 100.00 ng/mL  Vitamin B12   Collection Time: 05/01/24  4:11 PM  Result Value Ref Range   Vitamin B-12 423 211 - 911 pg/mL    Assessment and Plan  Chronic wrist pain, left Assessment & Plan: Chronic issue, previously evaluated by sports medicine and felt likely repetitive work injury.  Will evaluate with x-ray and move forward with referral to orthopedics  given not improving with anti-inflammatories. Symptoms not clearly consistent with carpal tunnel given location of pain.  Orders: -     Ambulatory referral to Orthopedic Surgery -     DG Wrist Complete Left; Future  Other fatigue Assessment & Plan:  acute, will evaluate with lab work but symptoms may be related to mood and possible ADD symptoms. Lab evaluation unremarkable consider reevaluation by PCP.  Orders: -     CBC with Differential/Platelet -     Comprehensive metabolic panel with GFR -     TSH -     VITAMIN D  25 Hydroxy (Vit-D Deficiency, Fractures) -     Vitamin B12    Return in about 2 weeks (around 05/15/2024) for  follow up with PCP.   Greig Ring, MD

## 2024-05-02 LAB — CBC WITH DIFFERENTIAL/PLATELET
Basophils Absolute: 0 K/uL (ref 0.0–0.1)
Basophils Relative: 0.2 % (ref 0.0–3.0)
Eosinophils Absolute: 0.1 K/uL (ref 0.0–0.7)
Eosinophils Relative: 1.9 % (ref 0.0–5.0)
HCT: 39.1 % (ref 36.0–46.0)
Hemoglobin: 13.4 g/dL (ref 12.0–15.0)
Lymphocytes Relative: 33.9 % (ref 12.0–46.0)
Lymphs Abs: 2.1 K/uL (ref 0.7–4.0)
MCHC: 34.1 g/dL (ref 30.0–36.0)
MCV: 91.7 fl (ref 78.0–100.0)
Monocytes Absolute: 0.7 K/uL (ref 0.1–1.0)
Monocytes Relative: 10.8 % (ref 3.0–12.0)
Neutro Abs: 3.4 K/uL (ref 1.4–7.7)
Neutrophils Relative %: 53.2 % (ref 43.0–77.0)
Platelets: 269 K/uL (ref 150.0–400.0)
RBC: 4.27 Mil/uL (ref 3.87–5.11)
RDW: 12.6 % (ref 11.5–15.5)
WBC: 6.3 K/uL (ref 4.0–10.5)

## 2024-05-02 LAB — COMPREHENSIVE METABOLIC PANEL WITH GFR
ALT: 12 U/L (ref 0–35)
AST: 15 U/L (ref 0–37)
Albumin: 4.2 g/dL (ref 3.5–5.2)
Alkaline Phosphatase: 41 U/L (ref 39–117)
BUN: 12 mg/dL (ref 6–23)
CO2: 27 meq/L (ref 19–32)
Calcium: 8.7 mg/dL (ref 8.4–10.5)
Chloride: 105 meq/L (ref 96–112)
Creatinine, Ser: 0.73 mg/dL (ref 0.40–1.20)
GFR: 106.64 mL/min (ref >60.00)
Glucose, Bld: 87 mg/dL (ref 70–99)
Potassium: 4 meq/L (ref 3.5–5.1)
Sodium: 137 meq/L (ref 135–145)
Total Bilirubin: 0.3 mg/dL (ref 0.2–1.2)
Total Protein: 6.6 g/dL (ref 6.0–8.3)

## 2024-05-02 LAB — VITAMIN D 25 HYDROXY (VIT D DEFICIENCY, FRACTURES): VITD: 60.08 ng/mL (ref 30.00–100.00)

## 2024-05-02 LAB — TSH: TSH: 1.39 u[IU]/mL (ref 0.35–5.50)

## 2024-05-02 LAB — VITAMIN B12: Vitamin B-12: 423 pg/mL (ref 211–911)

## 2024-05-07 ENCOUNTER — Ambulatory Visit (INDEPENDENT_AMBULATORY_CARE_PROVIDER_SITE_OTHER): Admitting: Sports Medicine

## 2024-05-07 VITALS — BP 120/84 | HR 74 | Ht 65.0 in | Wt 149.0 lb

## 2024-05-07 DIAGNOSIS — M7712 Lateral epicondylitis, left elbow: Secondary | ICD-10-CM

## 2024-05-07 DIAGNOSIS — M7702 Medial epicondylitis, left elbow: Secondary | ICD-10-CM

## 2024-05-07 DIAGNOSIS — M7711 Lateral epicondylitis, right elbow: Secondary | ICD-10-CM

## 2024-05-07 DIAGNOSIS — M7701 Medial epicondylitis, right elbow: Secondary | ICD-10-CM | POA: Diagnosis not present

## 2024-05-07 MED ORDER — METHYLPREDNISOLONE 4 MG PO TBPK: ORAL_TABLET | ORAL | 0 refills | Status: DC

## 2024-05-07 NOTE — Patient Instructions (Signed)
 Elbow HEP   Recommend getting two thumb spica braces to wear for the next 4 weeks after 2 gradually decrease use for 3-4   Prednisone  dos pak   3-4 week follow up

## 2024-05-07 NOTE — Progress Notes (Signed)
 Ben Kendrew Paci D.[facility] [address] [place] [address] Phone: [phone]   Assessment and Plan:     1. Lateral epicondylitis of both elbows (Primary) 2. Medial epicondylitis of both elbows -Chronic exacerbation, initial sports medicine visit - Consistent with bilateral medial and lateral epicondylitis, worse on the left compared to right.  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  I believe this will be beneficial - Start HEP for medial and lateral epicondylitis - Patient has had no significant improvement despite NSAID use including ibuprofen , diclofenac  p.o. use and topical Voltaren  gel use.  Patient states she has responded well to prednisone  Dosepaks in the past.  Start prednisone  Dosepak - Recommend using bilateral thumb spica wrist braces to decrease tension over the wrist, forearms, elbows.  Should use day and night over the next 2 weeks and then gradual discontinuation during weeks 3 and 4  15 additional minutes spent for educating Therapeutic Home Exercise Program.  This included exercises focusing on stretching, strengthening, with focus on eccentric aspects.   Long term goals include an improvement in range of motion, strength, endurance as well as avoiding reinjury. Patient's frequency would include in 1-2 times a day, 3-5 times a week for a duration of 6-12 weeks. Proper technique shown and discussed handout in great detail with ATC.  All questions were discussed and answered.      Pertinent previous records reviewed include family medicine note 05/01/2024  Follow Up: 3 to 4 weeks for reevaluation.  Could consider nitrogen patch versus ECSWT versus needling versus physical therapy   Subjective:   I, Moenique Parris, am serving as a Neurosurgeon for Doctor Morene Mace  Chief Complaint: hand pain   HPI:    05/07/24 Patient is a 33 year old female with hand pain. Patient states bilat hand pain. L is worse. Pain radiates to the elbow. Decreased ROM. Does a repetitive motion for her job. Numbness and tingling. Deceased grip strength. Ibu for the pain doesn't help. 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   Relevant Historical Information: None pertinent  Additional pertinent review of systems negative.   Current Outpatient Medications:    methylPREDNISolone  (MEDROL  DOSEPAK) 4 MG TBPK tablet, Take 6 tablets on day 1.  Take 5 tablets on day 2.  Take 4 tablets on day 3.  Take 3 tablets on day 4.  Take 2 tablets on day 5.  Take 1 tablet on day 6., Disp: 21 tablet, Rfl: 0   cyanocobalamin  (VITAMIN B12) 1000 MCG/ML injection, INJECT 1 ML (1,000 MCG) INTRAMUSCULARLY EVERY 30 DAYS, Disp: 3 mL, Rfl: 1   omeprazole  (PRILOSEC) 20 MG capsule, Take 1 capsule (20 mg total) by mouth daily., Disp: 30 capsule, Rfl: 3   Objective:     Vitals:   05/07/24 1303  BP: 120/84  Pulse: 74  SpO2: 100%  Weight: 149 lb (67.6 kg)  Height: 5' 5 (1.651 m)      Body mass index is 24.79 kg/m.    Physical Exam:    General: Appears well, no acute distress, nontoxic and pleasant Neck: FROM, no pain Neuro: sensation is intact distally with no deficits, strenghth is 5/5 in elbow flexors/extenders/supinator/pronators and wrist flexors/extensors Psych: no evidence of anxiety or depression  Bilateral elbow: No deformity, swelling or muscle wasting Normal Carrying angle ROM:0-140, supination and pronation 90 TTP medial and lateral epicondyle, pronator  and supinator.  All tender and mildly on right, and moderately on left NTTP over triceps, ticeps tendon, olecranon,  antecubital fossa, biceps tendon,  Negative tinnels over cubital tunnel pain with resisted wrist and middle digit extension, worse on left pain with resisted wrist flexion, worse on left pain with resisted supination, worse on left pain with resisted  pronation, worse on left Negative valgus stress Negative varus stress Negative milking maneuver    Electronically signed by:  Odis Mace D.[facility] 1:27 PM 05/07/24

## 2024-05-09 ENCOUNTER — Ambulatory Visit: Admitting: Family Medicine

## 2024-05-21 DIAGNOSIS — G8929 Other chronic pain: Secondary | ICD-10-CM | POA: Insufficient documentation

## 2024-05-21 DIAGNOSIS — R5383 Other fatigue: Secondary | ICD-10-CM | POA: Insufficient documentation

## 2024-05-21 NOTE — Assessment & Plan Note (Signed)
 Chronic issue, previously evaluated by sports medicine and felt likely repetitive work injury.  Will evaluate with x-ray and move forward with referral to orthopedics given not improving with anti-inflammatories. Symptoms not clearly consistent with carpal tunnel given location of pain.

## 2024-05-21 NOTE — Assessment & Plan Note (Signed)
 acute, will evaluate with lab work but symptoms may be related to mood and possible ADD symptoms. Lab evaluation unremarkable consider reevaluation by PCP.

## 2024-05-25 NOTE — Progress Notes (Deleted)
    Taylor Bautista Taylor Bautista [facility] [address] [place] [address] Phone: [phone]   Assessment and Plan:     ***    Pertinent previous records reviewed include ***   Follow Up: ***     Subjective:   I, Taylor Bautista, am serving as a Neurosurgeon for Doctor Morene Mace   Chief Complaint: hand pain    HPI:    05/07/24 Patient is a 33 year old female with hand pain. Patient states bilat hand pain. L is worse. Pain radiates to the elbow. Decreased ROM. Does a repetitive motion for her job. Numbness and tingling. Deceased grip strength. Ibu for the pain doesn't help. 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   05/28/2024 Patient states   Relevant Historical Information: None pertinent  Additional pertinent review of systems negative.   Current Outpatient Medications:    cyanocobalamin  (VITAMIN B12) 1000 MCG/ML injection, INJECT 1 ML (1,000 MCG) INTRAMUSCULARLY EVERY 30 DAYS, Disp: 3 mL, Rfl: 1   methylPREDNISolone  (MEDROL  DOSEPAK) 4 MG TBPK tablet, Take 6 tablets on day 1.  Take 5 tablets on day 2.  Take 4 tablets on day 3.  Take 3 tablets on day 4.  Take 2 tablets on day 5.  Take 1 tablet on day 6., Disp: 21 tablet, Rfl: 0   omeprazole  (PRILOSEC) 20 MG capsule, Take 1 capsule (20 mg total) by mouth daily., Disp: 30 capsule, Rfl: 3   Objective:     There were no vitals filed for this visit.    There is no height or weight on file to calculate BMI.    Physical Exam:    ***   Electronically signed by:  Odis Mace [facility]aut[facility] 7:34 AM 05/25/24

## 2024-05-28 ENCOUNTER — Ambulatory Visit: Admitting: Sports Medicine

## 2024-05-30 ENCOUNTER — Encounter: Payer: Self-pay | Admitting: *Deleted

## 2024-05-31 ENCOUNTER — Encounter: Payer: Self-pay | Admitting: Family Medicine

## 2024-05-31 ENCOUNTER — Ambulatory Visit: Payer: Self-pay

## 2024-05-31 ENCOUNTER — Ambulatory Visit (INDEPENDENT_AMBULATORY_CARE_PROVIDER_SITE_OTHER): Admitting: Family Medicine

## 2024-05-31 VITALS — BP 90/60 | HR 88 | Temp 97.2°F | Ht 65.0 in | Wt 148.1 lb

## 2024-05-31 DIAGNOSIS — R051 Acute cough: Secondary | ICD-10-CM

## 2024-05-31 DIAGNOSIS — R6889 Other general symptoms and signs: Secondary | ICD-10-CM

## 2024-05-31 DIAGNOSIS — B349 Viral infection, unspecified: Secondary | ICD-10-CM | POA: Diagnosis not present

## 2024-05-31 LAB — POC INFLUENZA A&B (BINAX/QUICKVUE)
Influenza A, POC: NEGATIVE
Influenza B, POC: NEGATIVE

## 2024-05-31 LAB — POC COVID19 BINAXNOW: SARS Coronavirus 2 Ag: NEGATIVE

## 2024-05-31 NOTE — Telephone Encounter (Signed)
     Duplicate encounter. Pt was triaged. Copied from CRM [id number]. Topic: Clinical - Red Word Triage >> May 31, 2024  8:36 AM Vena HERO wrote: Red Word that prompted transfer to Nurse Triage: diarrhea, congestion, sick for 4 days

## 2024-05-31 NOTE — Telephone Encounter (Signed)
 Noted. I see she was evaluated in office today

## 2024-05-31 NOTE — Telephone Encounter (Signed)
 FYI Only or Action Required?: Action required by provider: request for appointment.  Patient was last seen in primary care on 05/01/2024 by Avelina Greig BRAVO, MD.  Called Nurse Triage reporting Fatigue.  Symptoms began several days ago.  Interventions attempted: Rest, hydration, or home remedies.  Symptoms are: gradually improving.  Triage Disposition: See PCP When Office is Open (Within 3 Days)  Patient/caregiver understands and will follow disposition?: YesCopied from CRM #[id number]. Topic: Clinical - Red Word Triage >> May 31, 2024  8:40 AM Carlyon D wrote: Red Word that prompted transfer to Nurse Triage: hot flashes, body is weak, severe headaches, Diarrhea Reason for Disposition  [1] MILD weakness (e.g., does not interfere with ability to work, go to school, normal activities) AND [2] persists > 1 week  Answer Assessment - Initial Assessment Questions Pt feels she is recovering from sickness but needs dr note to return to work.       1. DESCRIPTION: Describe how you are feeling.     Just super drained 2. SEVERITY: How bad is it?  Can you stand and walk?     yes 3. ONSET: When did these symptoms begin? (e.g., hours, days, weeks, months)     Sunday  4. CAUSE: What do you think is causing the weakness or fatigue? (e.g., not drinking enough fluids, medical problem, trouble sleeping)     Not sure 5. NEW MEDICINES:  Have you started on any new medicines recently? (e.g., opioid pain medicines, benzodiazepines, muscle relaxants, antidepressants, antihistamines, neuroleptics, beta blockers)     denies 6. OTHER SYMPTOMS: Do you have any other symptoms? (e.g., chest pain, fever, cough, SOB, vomiting, diarrhea, bleeding, other areas of pain)     Diarrhea, headache, hot flashes  Protocols used: Weakness (Generalized) and Fatigue-A-AH

## 2024-05-31 NOTE — Progress Notes (Signed)
 [name], MD, CAQ Sports Medicine [facility] [address] [place], [address]  Phone: [phone]  FAX: [phone]  Taylor Bautista - 33 y.o. female  MRN [id number]  Date of Birth: July 30, 1991  Date: 05/31/2024  PCP: Wendee Lynwood HERO, NP  Referral: Wendee Lynwood HERO, NP  Chief Complaint  Patient presents with   Diarrhea    Started on Sunday    Fatigue        Nasal Congestion        Cough   Sore Throat    Started on Tuesday   Subjective:   Taylor Bautista is a 33 y.o. very pleasant female patient with Body mass index is 24.65 kg/m. who presents with the following:  Discussed the use of AI scribe software for clinical note transcription with the patient, who gave verbal consent to proceed.  History of Present Illness Taylor Bautista is a 33 year old female who presents with gastrointestinal and upper respiratory symptoms.  She began feeling unwell on Sunday with severe diarrhea that persisted for several days, showing slight improvement by the morning of the visit with only mild diarrhea remaining. She has not taken any medication for these symptoms.  In addition to gastrointestinal symptoms, she developed upper respiratory symptoms including a stuffy nose, sore throat, headaches, and a burning sensation in her nose. No earache, but she notes pressure or pain behind her eyes. She feels generally unwell with significant body aches and weakness. She experiences hot and cold sensations, sweating, and feeling clammy, although she did not measure her temperature.  She has been out of work all week due to her symptoms and only left the house for the first time on the day of the visit. Her sister, who lives in [place] , got sick around the same time after visiting her, and her son was also recently sick with a cold and is currently at home sick.    Review of Systems is noted in the HPI, as appropriate  Objective:   BP  90/60   Pulse 88   Temp (!) 97.2 F (36.2 C) (Temporal)   Ht 5' 5 (1.651 m)   Wt 148 lb 2 oz (67.2 kg)   LMP 05/19/2024 (Approximate)   SpO2 99%   BMI 24.65 kg/m    Gen: WDWN, cooperative. Globally Non-toxic HEENT: Normocephalic and atraumatic. Throat clear, w/o exudate, R TM clear, L TM - good landmarks, No fluid present. rhinnorhea. No frontal or maxillary sinus T. MMM NECK: Anterior cervical  LAD is absent CV: RRR, No M/G/R, cap refill <2 sec PULM: Breathing comfortably in no respiratory distress. no wheezing, crackles, rhonchi ABD: S,NT,ND,+BS. No HSM. No rebound. MSK: Nml gait   Physical Exam HEENT: Pharynx normal  Laboratory and Imaging Data:  Results for orders placed or performed in visit on 05/31/24  POC COVID-19   Collection Time: 05/31/24 11:03 AM  Result Value Ref Range   SARS Coronavirus 2 Ag Negative Negative  POC Influenza A&B (Binax test)   Collection Time: 05/31/24 11:04 AM  Result Value Ref Range   Influenza A, POC Negative Negative   Influenza B, POC Negative Negative     Assessment and Plan:     ICD-10-CM   1. Viral syndrome  B34.9     2. Acute cough  R05.1 POC COVID-19    POC Influenza A&B (Binax test)    CANCELED: POCT rapid strep A    3. Flu-like  symptoms  R68.89      Assessment & Plan Acute viral upper respiratory infection with diarrhea and body aches Symptoms consistent with viral infection. Possible fever. Shared exposure likely. Minimal relief from Motrin  Negative COVID flu Out of work this week return to work Monday  Orders placed today for conditions managed today: Orders Placed This Encounter  Procedures   POC COVID-19   POC Influenza A&B (Binax test)    Disposition: No follow-ups on file.  [employer] speech-to-text software was used for transcription in this dictation.  Possible transcriptional errors can occur using Animal nutritionist.   Signed,  Jacques DASEN. Solomiya Pascale, MD   Outpatient Encounter Medications as  of 05/31/2024  Medication Sig   cyanocobalamin  (VITAMIN B12) 1000 MCG/ML injection INJECT 1 ML (1,000 MCG) INTRAMUSCULARLY EVERY 30 DAYS   omeprazole  (PRILOSEC) 20 MG capsule Take 1 capsule (20 mg total) by mouth daily.   [DISCONTINUED] methylPREDNISolone  (MEDROL  DOSEPAK) 4 MG TBPK tablet Take 6 tablets on day 1.  Take 5 tablets on day 2.  Take 4 tablets on day 3.  Take 3 tablets on day 4.  Take 2 tablets on day 5.  Take 1 tablet on day 6.   No facility-administered encounter medications on file as of 05/31/2024.

## 2024-06-06 ENCOUNTER — Ambulatory Visit: Admitting: Nurse Practitioner

## 2024-06-08 ENCOUNTER — Ambulatory Visit (INDEPENDENT_AMBULATORY_CARE_PROVIDER_SITE_OTHER): Admitting: Nurse Practitioner

## 2024-06-08 VITALS — BP 102/76 | HR 82 | Temp 98.2°F | Ht 65.0 in | Wt 148.0 lb

## 2024-06-08 DIAGNOSIS — G479 Sleep disorder, unspecified: Secondary | ICD-10-CM | POA: Insufficient documentation

## 2024-06-08 DIAGNOSIS — R41840 Attention and concentration deficit: Secondary | ICD-10-CM | POA: Diagnosis not present

## 2024-06-08 MED ORDER — BUPROPION HCL ER (XL) 150 MG PO TB24: 150.0000 mg | ORAL_TABLET | Freq: Every day | ORAL | 1 refills | Status: DC

## 2024-06-08 NOTE — Assessment & Plan Note (Signed)
 Has been on Ambien  hydroxyzine  and melatonin in the past.  Patient has difficulty with feeling groggy in the morning.  We will hold off on doing any type of sleep aid at this juncture.  Patient can try lower dose melatonin 1 to 3 mg over-the-counter.  Patient states he takes Gummies

## 2024-06-08 NOTE — Assessment & Plan Note (Signed)
 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  Will start patient Wellbutrin  150 mg daily and refer to psychology for further evaluation and testing.

## 2024-06-08 NOTE — Progress Notes (Signed)
 Established Patient Office Visit  Subjective   Patient ID: Taylor Bautista, female    DOB: Jan 17, 1991  Age: 33 y.o. MRN: [id number]  Chief Complaint  Patient presents with   Follow-up    Pt complains of having trouble concentrating ongoing for a long time. Pt states she is not sleeping good at night, constantly busy and forgetting things because she cannot focus.     HPI  Discussed the use of AI scribe software for clinical note transcription with the patient, who gave verbal consent to proceed.  History of Present Illness Taylor Bautista is a 33 year old female who presents with difficulty concentrating and sleep disturbances.  She has experienced difficulty concentrating and focusing for most of her life, which has become more noticeable with age. 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 Her busy lifestyle, including managing her job at the [employer] and her children's sports activities, contributes to her feeling overwhelmed.  She describes her sleep disturbances as her mind racing at night, preventing her from falling asleep easily. She often wakes up earlier than intended, such as at 4:30 AM when she doesn't need to be up until 6:30 AM, and is unable to return to sleep. She attributes her lack of sleep to her inability to 'shut her mind off.'  She has tried over-the-counter sleep aids like melatonin, but they leave her feeling drowsy in the morning. She has also previously used Ambien  and Effexor  for mood, but she does not feel these addressed her current issues.  She has a history of anxiety, which she describes as situational rather than constant. No daily anxiety, but she feels anxious in response to specific events, such as arguments with her fianc. No depression and she is happy with her life.  In the past, she has seen a psychiatrist and was prescribed Xanax  during a period of depression, but she does not believe  depression is her current issue. She is aware that anxiety and attention issues can be interconnected.    Review of Systems  Constitutional:  Negative for chills and fever.  Respiratory:  Negative for shortness of breath.   Cardiovascular:  Negative for chest pain.  Gastrointestinal:  Negative for abdominal pain, diarrhea, nausea and vomiting.  Neurological:  Negative for headaches.  Psychiatric/Behavioral:  Negative for hallucinations and suicidal ideas.       Objective:     BP 102/76   Pulse 82   Temp 98.2 F (36.8 C) (Oral)   Ht 5' 5 (1.651 m)   Wt 148 lb (67.1 kg)   LMP 05/19/2024 (Approximate)   SpO2 99%   BMI 24.63 kg/m    Physical Exam Vitals and nursing note reviewed.  Constitutional:      Appearance: Normal appearance.  Cardiovascular:     Rate and Rhythm: Normal rate and regular rhythm.     Heart sounds: Normal heart sounds.  Pulmonary:     Effort: Pulmonary effort is normal.     Breath sounds: Normal breath sounds.  Neurological:     Mental Status: She is alert.      No results found for any visits on 06/08/24.    The ASCVD Risk score (Arnett DK, et al., 2019) failed to calculate for the following reasons:   The 2019 ASCVD risk score is only valid for ages 66 to 36    Assessment & Plan:   Problem List Items Addressed This Visit  Other   Trouble in sleeping   Has been on Ambien  hydroxyzine  and melatonin in the past.  Patient has difficulty with feeling groggy in the morning.  We will hold off on doing any type of sleep aid at this juncture.  Patient can try lower dose melatonin 1 to 3 mg over-the-counter.  Patient states he takes Gummies      Concentration deficit - Primary   Concern for concentration disorder.  Will start patient Wellbutrin  150 mg daily and refer to psychology for further evaluation and testing.      Relevant Medications   buPROPion  (WELLBUTRIN  XL) 150 MG 24 hr tablet   Other Relevant Orders   Ambulatory  referral to Psychology    Return if symptoms worsen or fail to improve.    Adina Crandall, NP

## 2024-06-08 NOTE — Patient Instructions (Addendum)
 Nice to see you today  We will try wellbutrin  to see if that helps with the concentration

## 2024-06-18 ENCOUNTER — Ambulatory Visit

## 2024-06-18 ENCOUNTER — Other Ambulatory Visit (HOSPITAL_COMMUNITY)
Admission: RE | Admit: 2024-06-18 | Discharge: 2024-06-18 | Disposition: A | Discharge status: Home / Self Care | Source: Ambulatory Visit | Attending: Family Medicine | Admitting: Family Medicine

## 2024-06-18 DIAGNOSIS — N898 Other specified noninflammatory disorders of vagina: Secondary | ICD-10-CM

## 2024-06-18 MED ORDER — METRONIDAZOLE 0.75 % VA GEL: 1.0000 | Freq: Every day | VAGINAL | 1 refills | Status: DC

## 2024-06-18 NOTE — Progress Notes (Signed)
 SUBJECTIVE:  33 y.o. female complains of  vaginal discharge possible BV Denies abnormal vaginal bleeding or significant pelvic pain or fever. No UTI symptoms. Denies history of known exposure to STD.  Patient's last menstrual period was 05/19/2024 (approximate).  OBJECTIVE:  She appears well, afebrile.   ASSESSMENT:  Vaginal Discharge     PLAN:  GC, chlamydia, trichomonas, BVAG, CVAG probe sent to lab. Treatment: Rx sent today pt prefers cream treatment notes not able to tolerate PO treatment. ROV prn if symptoms persist or worsen.

## 2024-06-20 LAB — CERVICOVAGINAL ANCILLARY ONLY
Bacterial Vaginitis (gardnerella): NEGATIVE
Candida Glabrata: NEGATIVE
Candida Vaginitis: NEGATIVE
Chlamydia: NEGATIVE
Comment: NEGATIVE
Comment: NEGATIVE
Comment: NEGATIVE
Comment: NEGATIVE
Comment: NEGATIVE
Comment: NORMAL
Neisseria Gonorrhea: NEGATIVE
Trichomonas: NEGATIVE

## 2024-06-21 ENCOUNTER — Ambulatory Visit: Payer: Self-pay | Admitting: Family Medicine

## 2024-06-25 ENCOUNTER — Telehealth: Payer: Self-pay | Admitting: Nurse Practitioner

## 2024-06-25 NOTE — Telephone Encounter (Signed)
 Noted. We will give it more time.

## 2024-06-25 NOTE — Telephone Encounter (Signed)
 See how the patient is doing with the Wellbutrin  in regards to concentration

## 2024-06-25 NOTE — Telephone Encounter (Signed)
 Spoke with patient. She says she doesn't feel any difference quite yet with Wellbutrin  in regards to concentration.

## 2024-06-25 NOTE — Telephone Encounter (Signed)
-----   Message from [facility] sent at 06/08/2024  4:17 PM EDT ----- Regarding: concentratoin See how the patient is doing with the Wellbutrin  in regards to concentration

## 2024-07-10 ENCOUNTER — Other Ambulatory Visit: Payer: Self-pay | Admitting: Nurse Practitioner

## 2024-07-10 DIAGNOSIS — R41840 Attention and concentration deficit: Secondary | ICD-10-CM

## 2024-10-01 ENCOUNTER — Ambulatory Visit: Payer: Self-pay

## 2024-10-01 NOTE — Telephone Encounter (Signed)
 FYI Only or Action Required?: FYI only for provider: appointment scheduled on 10/03/24.  Patient was last seen in primary care on 06/08/2024 by Wendee Lynwood HERO, NP.  Called Nurse Triage reporting Breast Pain.  Symptoms began several days ago.  Interventions attempted: Nothing.  Symptoms are: unchanged.  Triage Disposition: See PCP Within 2 Weeks  Patient/caregiver understands and will follow disposition?: Yes    Message from Alexandria E sent at 10/01/2024 11:02 AM EST  Summary: Breast pain   Reason for Triage: Left breast pain that has been coming and going, pain has been there for about 1 week.         Reason for Disposition  [1] Breast pain AND [2] cause is not known  Answer Assessment - Initial Assessment Questions Returned pt's call to f/u on symptoms. Pt states that she has had intermittent 10/10 pain in her L nipple. No discharge, no fever, no s/s of infection. Pt worried as she does have a family hx of breast cancer; no personal hx for pt. Pt agreeable to see alternative provider for appt asap. Appointment scheduled for evaluation. Patient agrees with plan of care, and will call back if anything changes, or if symptoms worsen.     1. SYMPTOM: What's the main symptom you're concerned about?  (e.g., lump, nipple discharge, pain, rash)     Nipple pain; L nipple only   2. LOCATION: Where is the symptoms located?     L nipple   3. ONSET: When did symptoms  start?     Over the past week   4. PRIOR HISTORY: Do you have any history of prior problems with your breasts? (e.g., breast cancer, breast implant, fibrocystic breast disease)     Family hx of breast cancer   5. CAUSE: What do you think is causing this symptom?     Unsure   6. OTHER SYMPTOMS: Do you have any other symptoms? (e.g., breast pain, fever, nipple discharge, redness or rash)     No  Protocols used: Breast Symptoms-A-AH

## 2024-10-01 NOTE — Telephone Encounter (Signed)
 noted

## 2024-10-03 ENCOUNTER — Ambulatory Visit
Admission: RE | Admit: 2024-10-03 | Discharge: 2024-10-03 | Disposition: A | Source: Ambulatory Visit | Attending: Family

## 2024-10-03 ENCOUNTER — Ambulatory Visit (INDEPENDENT_AMBULATORY_CARE_PROVIDER_SITE_OTHER): Admitting: Family

## 2024-10-03 ENCOUNTER — Encounter: Payer: Self-pay | Admitting: Family

## 2024-10-03 ENCOUNTER — Ambulatory Visit
Admission: RE | Admit: 2024-10-03 | Discharge: 2024-10-03 | Disposition: A | Discharge status: Home / Self Care | Source: Ambulatory Visit | Attending: Family | Admitting: Family

## 2024-10-03 VITALS — BP 106/70 | HR 80 | Temp 98.0°F | Ht 65.0 in | Wt 152.6 lb

## 2024-10-03 DIAGNOSIS — N6311 Unspecified lump in the right breast, upper outer quadrant: Secondary | ICD-10-CM

## 2024-10-03 DIAGNOSIS — N644 Mastodynia: Secondary | ICD-10-CM | POA: Diagnosis not present

## 2024-10-03 DIAGNOSIS — M79629 Pain in unspecified upper arm: Secondary | ICD-10-CM

## 2024-10-03 DIAGNOSIS — N6321 Unspecified lump in the left breast, upper outer quadrant: Secondary | ICD-10-CM | POA: Insufficient documentation

## 2024-10-03 DIAGNOSIS — Z803 Family history of malignant neoplasm of breast: Secondary | ICD-10-CM

## 2024-10-03 MED ORDER — CEPHALEXIN 500 MG PO CAPS: 500.0000 mg | ORAL_CAPSULE | Freq: Four times a day (QID) | ORAL | 0 refills | Status: AC

## 2024-10-03 NOTE — Addendum Note (Signed)
 Addended by: CORWIN ANTU on: 10/03/2024 01:50 PM   Modules accepted: Orders

## 2024-10-03 NOTE — Progress Notes (Signed)
 "  Established Patient Office Visit  Subjective:      CC:  Chief Complaint  Patient presents with   Breast Pain    L breast around her nipple    HPI: Taylor Bautista is a 34 y.o. female presenting on 10/03/2024 for Breast Pain (L breast around her nipple) .  Discussed the use of AI scribe software for clinical note transcription with the patient, who gave verbal consent to proceed.  History of Present Illness Taylor Bautista is a 34 year old female who presents with left nipple pain.  She has been experiencing intermittent pain in her left nipple for the past week. The pain occurs every couple of hours and is not associated with redness, discharge, or rash. It can be triggered by pressure, such as when lying on her arm, but also occurs spontaneously while standing. The pain is described as superficial and localized to the nipple, with no radiation to other areas.  She denies any history of nipple piercings or recent trauma to the area. There is no increased pain with movement, and she has not noticed any lumps or changes in the breast tissue. She has a family history of breast cancer, with her mother having had breast cancer twice, most recently four years ago, and a sister with lymphoma. She underwent a mammogram in 2022, which was normal.  Additionally, she reports a recent injury to her finger, which she suspects occurred while cleaning the shower. The finger has become swollen and painful, and she is unsure of the exact cause of the injury. She is up to date on her tetanus vaccination, with the next dose due next year.         Social history:  Relevant past medical, surgical, family and social history reviewed and updated as indicated. Interim medical history since our last visit reviewed.  Allergies and medications reviewed and updated.  DATA REVIEWED: CHART IN EPIC     ROS: Negative unless specifically indicated above in HPI.   Current Medications[1]         Objective:        BP 106/70 (BP Location: Right Arm, Patient Position: Sitting)   Pulse 80   Temp 98 F (36.7 C) (Temporal)   Ht 5' 5 (1.651 m)   Wt 152 lb 9.6 oz (69.2 kg)   LMP 09/03/2024 (Approximate)   SpO2 98%   BMI 25.39 kg/m   Physical Exam BREAST: Tenderness and sensitivity in the left breast nipple area with mild tenderness overall. No tenderness in the right breast except for slight tenderness in the upper area.  Wt Readings from Last 3 Encounters:  10/03/24 152 lb 9.6 oz (69.2 kg)  06/08/24 148 lb (67.1 kg)  05/31/24 148 lb 2 oz (67.2 kg)    Physical Exam Vitals reviewed.  Chest:  Breasts:    Right: Mass (right outer upper) present. No inverted nipple, nipple discharge or skin change.     Left: Tenderness (nipple tenderness) present. No inverted nipple, nipple discharge or skin change.    Lymphadenopathy:     Upper Body:     Right upper body: Axillary adenopathy present.     Left upper body: Axillary adenopathy present.  Skin:    Comments: Right 2nd metacarpal with two lacerations and some erythematic warm swelling by medial phalynx          Results Radiology Bilateral breast mammogram (2022): Normal  Assessment & Plan:   Assessment and Plan Assessment & Plan Breast pain  and tenderness Intermittent left nipple pain for one week, superficial and non-radiating. No redness, discharge, or trauma. Family history of breast cancer. Physical exam reveals tenderness in the left outer upper quadrant and right upper quadrant with densities. Differential includes fibroadenomas, fatty cysts, or referred pain. Imaging warranted due to family history and symptoms. - Ordered lateral breast ultrasound and diagnostic mammogram - Prescribed cephalexin  for potential mastitis or ductal inflammation, to be taken four times daily for up to ten days, with improvement expected in five to seven days  Finger laceration with possible cellulitis Swelling and pain in  finger following a cut while cleaning the shower. No clear source of injury. Possible cellulitis due to irritation from chloraseptic or bacterial infection. Tetanus vaccination is up to date. - Prescribed cephalexin  for five days for possible cellulitis - Advised to cover the wound with a bandage        Return for f/u PCP if no improvement in symptoms.     Ginger Patrick, MSN, APRN, FNP-C [facility]        [1]  Current Outpatient Medications:    buPROPion  (WELLBUTRIN  XL) 150 MG 24 hr tablet, TAKE 1 TABLET BY MOUTH EVERY DAY, Disp: 90 tablet, Rfl: 1   cephALEXin  (KEFLEX ) 500 MG capsule, Take 1 capsule (500 mg total) by mouth 4 (four) times daily for 7 days., Disp: 28 capsule, Rfl: 0   cyanocobalamin  (VITAMIN B12) 1000 MCG/ML injection, INJECT 1 ML (1,000 MCG) INTRAMUSCULARLY EVERY 30 DAYS, Disp: 3 mL, Rfl: 1  "

## 2024-10-03 NOTE — Patient Instructions (Signed)
" °  I have sent an electronic order over to your preferred location for the following:   []   Bilateral breast diagnostic mammogram and a bilateral breast ultrasound  Please give this center a call to get scheduled at your convenience.  [x]   [facility]  [address] [place] [address]  [phone]  Make sure to wear two piece  clothing  No lotions powders or deodorants the day of the appointment Make sure to bring picture ID and insurance card.  Bring list of medications you are currently taking including any supplements.   "

## 2024-10-03 NOTE — Telephone Encounter (Signed)
 NOTED

## 2024-10-04 ENCOUNTER — Ambulatory Visit: Payer: Self-pay | Admitting: Family

## 2024-10-04 DIAGNOSIS — N6311 Unspecified lump in the right breast, upper outer quadrant: Secondary | ICD-10-CM

## 2024-10-04 DIAGNOSIS — Z803 Family history of malignant neoplasm of breast: Secondary | ICD-10-CM

## 2024-10-04 DIAGNOSIS — N6321 Unspecified lump in the left breast, upper outer quadrant: Secondary | ICD-10-CM

## 2024-10-08 DIAGNOSIS — Z803 Family history of malignant neoplasm of breast: Secondary | ICD-10-CM | POA: Insufficient documentation

## 2024-10-08 NOTE — Addendum Note (Signed)
 Addended by: CORWIN ANTU on: 10/08/2024 08:56 AM   Modules accepted: Orders

## 2024-10-10 ENCOUNTER — Ambulatory Visit
Admission: RE | Admit: 2024-10-10 | Discharge: 2024-10-10 | Disposition: A | Discharge status: Home / Self Care | Source: Ambulatory Visit | Attending: Family | Admitting: Family

## 2024-10-10 DIAGNOSIS — N6311 Unspecified lump in the right breast, upper outer quadrant: Secondary | ICD-10-CM | POA: Insufficient documentation

## 2024-10-10 DIAGNOSIS — N6321 Unspecified lump in the left breast, upper outer quadrant: Secondary | ICD-10-CM | POA: Diagnosis present

## 2024-10-10 DIAGNOSIS — Z803 Family history of malignant neoplasm of breast: Secondary | ICD-10-CM | POA: Insufficient documentation

## 2024-10-10 MED ORDER — GADOBUTROL 1 MMOL/ML IV SOLN
7.0000 mL | Freq: Once | INTRAVENOUS | Status: AC | PRN
  Administered 2024-10-10: 7 mL via INTRAVENOUS

## 2024-10-11 ENCOUNTER — Ambulatory Visit: Payer: Self-pay | Admitting: Family

## 2024-10-11 DIAGNOSIS — N61 Mastitis without abscess: Secondary | ICD-10-CM

## 2024-10-11 MED ORDER — SULFAMETHOXAZOLE-TRIMETHOPRIM 800-160 MG PO TABS: 1.0000 | ORAL_TABLET | Freq: Two times a day (BID) | ORAL | 0 refills | Status: AC
